# Patient Record
Sex: Female | Born: 1965
Health system: Southern US, Community
[De-identification: ages and names within clinical notes are randomized; demographics above are authoritative.]

## PROBLEM LIST (undated history)

## (undated) ENCOUNTER — Emergency Department (HOSPITAL_COMMUNITY): Payer: No Typology Code available for payment source | Source: Home / Self Care

## (undated) DIAGNOSIS — K4030 Unilateral inguinal hernia, with obstruction, without gangrene, not specified as recurrent: Secondary | ICD-10-CM

## (undated) DIAGNOSIS — F419 Anxiety disorder, unspecified: Secondary | ICD-10-CM

## (undated) DIAGNOSIS — T7840XA Allergy, unspecified, initial encounter: Secondary | ICD-10-CM

## (undated) DIAGNOSIS — I10 Essential (primary) hypertension: Secondary | ICD-10-CM

## (undated) DIAGNOSIS — F329 Major depressive disorder, single episode, unspecified: Secondary | ICD-10-CM

## (undated) DIAGNOSIS — G709 Myoneural disorder, unspecified: Secondary | ICD-10-CM

## (undated) DIAGNOSIS — F32A Depression, unspecified: Secondary | ICD-10-CM

## (undated) DIAGNOSIS — F909 Attention-deficit hyperactivity disorder, unspecified type: Secondary | ICD-10-CM

## (undated) HISTORY — DX: Anxiety disorder, unspecified: F41.9

## (undated) HISTORY — DX: Unilateral inguinal hernia, with obstruction, without gangrene, not specified as recurrent: K40.30

## (undated) HISTORY — DX: Essential (primary) hypertension: I10

## (undated) HISTORY — PX: FRACTURE SURGERY: SHX138

## (undated) HISTORY — PX: HERNIA REPAIR: SHX51

## (undated) HISTORY — DX: Myoneural disorder, unspecified: G70.9

## (undated) HISTORY — PX: TONSILLECTOMY: SUR1361

## (undated) HISTORY — DX: Allergy, unspecified, initial encounter: T78.40XA

---

## 2009-11-29 ENCOUNTER — Emergency Department (HOSPITAL_COMMUNITY): Admission: EM | Admit: 2009-11-29 | Discharge: 2009-11-29 | Payer: Self-pay | Admitting: Emergency Medicine

## 2010-07-21 ENCOUNTER — Encounter: Payer: Self-pay | Admitting: Chiropractic Medicine

## 2010-09-16 LAB — COMPREHENSIVE METABOLIC PANEL
AST: 114 U/L — ABNORMAL HIGH (ref 0–37)
Albumin: 4.4 g/dL (ref 3.5–5.2)
Alkaline Phosphatase: 69 U/L (ref 39–117)
CO2: 26 mEq/L (ref 19–32)
Calcium: 8.7 mg/dL (ref 8.4–10.5)
Chloride: 104 mEq/L (ref 96–112)
Creatinine, Ser: 0.63 mg/dL (ref 0.4–1.2)
GFR calc non Af Amer: >60 mL/min (ref >60)
Potassium: 3.3 mEq/L — ABNORMAL LOW (ref 3.5–5.1)
Total Protein: 7.2 g/dL (ref 6.0–8.3)

## 2010-09-16 LAB — CBC
HCT: 40.2 % (ref 36.0–46.0)
MCV: 100 fL (ref 78.0–100.0)
Platelets: 189 10*3/uL (ref 150–400)
RBC: 4.02 MIL/uL (ref 3.87–5.11)
RDW: 12.8 % (ref 11.5–15.5)
WBC: 4.1 10*3/uL (ref 4.0–10.5)

## 2010-09-16 LAB — URINALYSIS, ROUTINE W REFLEX MICROSCOPIC
Leukocytes, UA: NEGATIVE
Protein, ur: NEGATIVE mg/dL
Specific Gravity, Urine: 1.012 (ref 1.005–1.030)
Urobilinogen, UA: 0.2 mg/dL (ref 0.0–1.0)

## 2010-09-16 LAB — APTT: aPTT: 31 seconds (ref 24–37)

## 2010-09-16 LAB — DIFFERENTIAL
Eosinophils Absolute: 0.1 10*3/uL (ref 0.0–0.7)
Eosinophils Relative: 2 % (ref 0–5)
Lymphs Abs: 1.9 10*3/uL (ref 0.7–4.0)
Monocytes Relative: 8 % (ref 3–12)
Neutro Abs: 1.7 10*3/uL (ref 1.7–7.7)

## 2011-10-16 ENCOUNTER — Encounter (HOSPITAL_COMMUNITY): Payer: Self-pay | Admitting: Pharmacy Technician

## 2011-10-17 ENCOUNTER — Encounter (HOSPITAL_COMMUNITY): Payer: Self-pay

## 2011-10-17 ENCOUNTER — Ambulatory Visit
Admission: RE | Admit: 2011-10-17 | Discharge: 2011-10-17 | Disposition: A | Discharge status: Home / Self Care | Payer: No Typology Code available for payment source | Source: Ambulatory Visit | Attending: Orthopedic Surgery | Admitting: Orthopedic Surgery

## 2011-10-17 ENCOUNTER — Other Ambulatory Visit: Payer: Self-pay | Admitting: Orthopedic Surgery

## 2011-10-17 ENCOUNTER — Encounter (HOSPITAL_COMMUNITY)
Admission: RE | Admit: 2011-10-17 | Discharge: 2011-10-17 | Disposition: A | Discharge status: Home / Self Care | Payer: Self-pay | Source: Ambulatory Visit | Attending: Orthopedic Surgery | Admitting: Orthopedic Surgery

## 2011-10-17 DIAGNOSIS — S5290XA Unspecified fracture of unspecified forearm, initial encounter for closed fracture: Secondary | ICD-10-CM

## 2011-10-17 HISTORY — DX: Attention-deficit hyperactivity disorder, unspecified type: F90.9

## 2011-10-17 HISTORY — DX: Major depressive disorder, single episode, unspecified: F32.9

## 2011-10-17 HISTORY — DX: Depression, unspecified: F32.A

## 2011-10-17 LAB — CBC
HCT: 40 % (ref 36.0–46.0)
MCV: 94.3 fL (ref 78.0–100.0)
Platelets: 264 10*3/uL (ref 150–400)
RDW: 12.1 % (ref 11.5–15.5)
WBC: 5.1 10*3/uL (ref 4.0–10.5)

## 2011-10-17 LAB — HCG, SERUM, QUALITATIVE: Preg, Serum: NEGATIVE

## 2011-10-17 NOTE — Progress Notes (Signed)
Called Dr. Glenna Durand office, spoke with Eunice Blase, requested orders for preadmission, surgery date 10/18/11

## 2011-10-17 NOTE — H&P (Signed)
Rebekah Campbell is an 46 y.o. female.   Chief Complaint: Fall on outstretched right wrist HPI: Pt fell within previous week and sustained closed injury to right distal radius Pt seen and examined in office Pt here for surgery today to restore congruity to her wrist joint. Pt had preoperative CT scan done at [facility] imaging  Past Medical History  Diagnosis Date  . Depression     Hx ; had subsided.  . ADHD (attention deficit hyperactivity disorder) Dx'd 2008    Past Surgical History  Procedure Date  . No past surgeries   . Tonsillectomy     Family History  Problem Relation Age of Onset  . Anesthesia problems Neg Hx    Social History:  reports that she has never smoked. She does not have any smokeless tobacco history on file. She reports that she drinks alcohol. She reports that she does not use illicit drugs.  Allergies:  Allergies  Allergen Reactions  . Codeine Nausea And Vomiting    No current facility-administered medications on file as of .   No current outpatient prescriptions on file as of .    Results for orders placed during the hospital encounter of 10/17/11 (from the past 48 hour(s))  SURGICAL PCR SCREEN     Status: Abnormal   Collection Time   10/17/11 10:28 AM      Component Value Range Comment   MRSA, PCR POSITIVE (*) NEGATIVE     Staphylococcus aureus POSITIVE (*) NEGATIVE    CBC     Status: Normal   Collection Time   10/17/11 10:38 AM      Component Value Range Comment   WBC 5.1  4.0 - 10.5 (K/uL)    RBC 4.24  3.87 - 5.11 (MIL/uL)    Hemoglobin 13.7  12.0 - 15.0 (g/dL)    HCT 11.9  14.7 - 82.9 (%)    MCV 94.3  78.0 - 100.0 (fL)    MCH 32.3  26.0 - 34.0 (pg)    MCHC 34.3  30.0 - 36.0 (g/dL)    RDW 56.2  13.0 - 86.5 (%)    Platelets 264  150 - 400 (K/uL)   HCG, SERUM, QUALITATIVE     Status: Normal   Collection Time   10/17/11 10:38 AM      Component Value Range Comment   Preg, Serum NEGATIVE  NEGATIVE     Ct Wrist Right Wo Contrast  10/17/2011   *RADIOLOGY REPORT*  Clinical Data: Radial fracture status post fall 10/14/2011. Preoperative evaluation.  CT OF THE RIGHT WRIST WITHOUT CONTRAST  Technique:  Multidetector CT imaging was performed according to the standard protocol. Multiplanar CT image reconstructions were also generated.  Comparison: None.  Findings: There is a comminuted intra-articular fracture of the distal radius with associated apex volar angulation.  The posterior cortex demonstrates up to 4 mm of displacement.  There is intra- articular extension dorsally with up to 3 mm of offset of the articular surface.  Fracture extends into the distal radioulnar joint.  The radial styloid is not significantly displaced.  There is no significant widening of the distal radioulnar joint.  The distal ulna is intact.  No acute carpal bone fractures are identified.  There is a well corticated ossicle volar to the hamate consistent with an os hamuli or remote hook of hamate fracture.  Soft tissue windows demonstrate a radiocarpal joint effusion with soft tissue swelling surrounding the wrist.  Note tendon entrapment is identified.  There is  an apparent tiny superficial lipoma superficial to the extensor carpi radialis tendons at the level of the trapezium.  IMPRESSION:  1.  Comminuted intra-articular fracture of the distal radius with articular surface irregularity, displacement and mild angulation as described. 2.  Intact distal ulna and carpal bones. 3.  Os hamuli versus remote hook of hamate fracture.  Original Report Authenticated By: Gerrianne Scale, M.D.    No recent illnesses or hospitalizations  There were no vitals taken for this visit. General Appearance:  Alert, cooperative, no distress, appears stated age  Head:  Normocephalic, without obvious abnormality, atraumatic  Eyes:  Pupils equal, conjunctiva/corneas clear,         Throat: Lips, mucosa, and tongue normal; teeth and gums normal  Neck: No visible masses     Lungs:    respirations unlabored  Chest Wall:  No tenderness or deformity  Heart:  Regular rate and rhythm,  Abdomen:   Soft, non-tender,         Extremities: Right wrist: in sugartong splint. Fingers warm well perfused Wiggles digits. Splint in place Able to extend thumb  Pulses: 2+ and symmetric  Skin: Skin color, texture, turgor normal, no rashes or lesions     Neurologic: Normal    Assessment/Plan Right comminuted distal radius fracture, intraarticular displaced  Right wrist distal radius open reduction and internal fixation  R/B/A DISCUSSED WITH PT IN OFFICE.  PT VOICED UNDERSTANDING OF PLAN CONSENT SIGNED DAY OF SURGERY PT SEEN AND EXAMINED PRIOR TO OPERATIVE PROCEDURE/DAY OF SURGERY SITE MARKED. QUESTIONS ANSWERED WILL REMAIN AN INPATIENT FOLLOWING SURGERY-23 HOUR OBSERVATION  Sharma Covert 10/17/2011, 6:33 PM

## 2011-10-17 NOTE — Pre-Procedure Instructions (Signed)
19 Rebekah Campbell  10/17/2011   Your procedure is scheduled on:  Saturday, 10/18/2011 @7 :25AM.  Report to [facility] at 7:25 AM.  Call this number if you have problems the morning of surgery: [phone]   Remember:   Do not eat food:After Midnight.  May have clear liquids: up to 4 Hours before arrival( nothing after 3:25 AM).  Clear liquids include soda, tea, black coffee, apple or grape juice, broth.  Take these medicines the morning of surgery with A SIP OF WATER: Adderall   Do not wear jewelry, make-up or nail polish.  Do not wear lotions, powders, or perfumes. You may wear deodorant.  Do not shave 48 hours prior to surgery.  Do not bring valuables to the hospital.  Contacts, dentures or bridgework may not be worn into surgery.  Leave suitcase in the car. After surgery it may be brought to your room.  For patients admitted to the hospital, checkout time is 11:00 AM the day of discharge.   Patients discharged the day of surgery will not be allowed to drive home.  Name and phone number of your driver.  Special Instructions: CHG Shower Use Special Wash: 1/2 bottle night before surgery and 1/2 bottle morning of surgery.   Please read over the following fact sheets that you were given: Pain Booklet, Coughing and Deep Breathing and Surgical Site Infection Prevention

## 2011-10-17 NOTE — Pre-Procedure Instructions (Signed)
20 Rebekah Campbell  10/17/2011   Your procedure is scheduled on:  10/18/2011  Report to [facility] at 0725 AM.  Call this number if you have problems the morning of surgery: [phone]   Remember:   Do not eat food:After Midnight.  May have clear liquids: up to 4 Hours before arrival.0325 am ... Do not drink any liquids after 0325 am the day of surgery,.   Clear liquids include soda, tea, black coffee, apple or grape juice, broth.  Take these medicines the morning of surgery with A SIP OF WATER: adderall   Do not wear jewelry, make-up or nail polish.  Do not wear lotions, powders, or perfumes. You may wear deodorant.  Do not shave 48 hours prior to surgery.  Do not bring valuables to the hospital.  Contacts, dentures or bridgework may not be worn into surgery.  Leave suitcase in the car. After surgery it may be brought to your room.  For patients admitted to the hospital, checkout time is 11:00 AM the day of discharge.   Patients discharged the day of surgery will not be allowed to drive home.  Name and phone number of your driver: chet linker- friend  [phone]  Special Instructions: CHG Shower Use Special Wash: 1/2 bottle night before surgery and 1/2 bottle morning of surgery.   Please read over the following fact sheets that you were given: Pain Booklet, Coughing and Deep Breathing, MRSA Information and Surgical Site Infection Prevention

## 2011-10-18 ENCOUNTER — Encounter (HOSPITAL_COMMUNITY): Payer: Self-pay | Admitting: Anesthesiology

## 2011-10-18 ENCOUNTER — Encounter (HOSPITAL_COMMUNITY): Payer: Self-pay | Admitting: *Deleted

## 2011-10-18 ENCOUNTER — Encounter (HOSPITAL_COMMUNITY)
Admission: RE | Disposition: A | Discharge status: Home / Self Care | Payer: Self-pay | Source: Ambulatory Visit | Attending: Orthopedic Surgery

## 2011-10-18 ENCOUNTER — Ambulatory Visit (HOSPITAL_COMMUNITY)
Admission: RE | Admit: 2011-10-18 | Discharge: 2011-10-19 | Disposition: A | Discharge status: Home / Self Care | Payer: Self-pay | Source: Ambulatory Visit | Attending: Orthopedic Surgery | Admitting: Orthopedic Surgery

## 2011-10-18 ENCOUNTER — Ambulatory Visit (HOSPITAL_COMMUNITY): Payer: Self-pay | Admitting: Anesthesiology

## 2011-10-18 DIAGNOSIS — Y929 Unspecified place or not applicable: Secondary | ICD-10-CM | POA: Insufficient documentation

## 2011-10-18 DIAGNOSIS — Z01812 Encounter for preprocedural laboratory examination: Secondary | ICD-10-CM | POA: Insufficient documentation

## 2011-10-18 DIAGNOSIS — S52599A Other fractures of lower end of unspecified radius, initial encounter for closed fracture: Secondary | ICD-10-CM | POA: Insufficient documentation

## 2011-10-18 DIAGNOSIS — W19XXXA Unspecified fall, initial encounter: Secondary | ICD-10-CM | POA: Insufficient documentation

## 2011-10-18 SURGERY — OPEN REDUCTION INTERNAL FIXATION (ORIF) DISTAL RADIUS FRACTURE
Anesthesia: General | Site: Wrist | Laterality: Right | Wound class: Clean

## 2011-10-18 MED ORDER — CEFAZOLIN SODIUM 1-5 GM-% IV SOLN
1.0000 g | INTRAVENOUS | Status: AC
  Administered 2011-10-18: 1 g via INTRAVENOUS

## 2011-10-18 MED ORDER — OXYCODONE-ACETAMINOPHEN 5-325 MG PO TABS: 1.0000 | ORAL_TABLET | ORAL | Status: AC | PRN

## 2011-10-18 MED ORDER — PROPOFOL 10 MG/ML IV EMUL
INTRAVENOUS | Status: DC | PRN
  Administered 2011-10-18: 320 mg via INTRAVENOUS

## 2011-10-18 MED ORDER — OXYCODONE HCL 5 MG PO TABS
5.0000 mg | ORAL_TABLET | ORAL | Status: DC | PRN
  Administered 2011-10-18 – 2011-10-19 (×2): 10 mg via ORAL
  Filled 2011-10-18 (×2): qty 2

## 2011-10-18 MED ORDER — LACTATED RINGERS IV SOLN
INTRAVENOUS | Status: DC | PRN
  Administered 2011-10-18 (×2): via INTRAVENOUS

## 2011-10-18 MED ORDER — DOCUSATE SODIUM 100 MG PO CAPS
100.0000 mg | ORAL_CAPSULE | Freq: Two times a day (BID) | ORAL | Status: DC
  Administered 2011-10-18 – 2011-10-19 (×2): 100 mg via ORAL
  Filled 2011-10-18 (×3): qty 1

## 2011-10-18 MED ORDER — LIDOCAINE HCL (CARDIAC) 20 MG/ML IV SOLN
INTRAVENOUS | Status: DC | PRN
  Administered 2011-10-18: 80 mg via INTRAVENOUS

## 2011-10-18 MED ORDER — ONDANSETRON HCL 4 MG/2ML IJ SOLN
4.0000 mg | Freq: Four times a day (QID) | INTRAMUSCULAR | Status: DC | PRN
  Administered 2011-10-18 – 2011-10-19 (×2): 4 mg via INTRAVENOUS
  Filled 2011-10-18 (×2): qty 2

## 2011-10-18 MED ORDER — ZOLPIDEM TARTRATE 5 MG PO TABS
5.0000 mg | ORAL_TABLET | Freq: Every evening | ORAL | Status: DC | PRN
  Administered 2011-10-18: 5 mg via ORAL
  Filled 2011-10-18: qty 1

## 2011-10-18 MED ORDER — ONDANSETRON HCL 4 MG/2ML IJ SOLN
INTRAMUSCULAR | Status: DC | PRN
  Administered 2011-10-18: 4 mg via INTRAVENOUS
  Administered 2011-10-18: 4 mg

## 2011-10-18 MED ORDER — FENTANYL CITRATE 0.05 MG/ML IJ SOLN
INTRAMUSCULAR | Status: DC | PRN
  Administered 2011-10-18: 100 ug via INTRAVENOUS
  Administered 2011-10-18: 50 ug via INTRAVENOUS

## 2011-10-18 MED ORDER — DIPHENHYDRAMINE HCL 25 MG PO CAPS: 25.0000 mg | ORAL_CAPSULE | Freq: Four times a day (QID) | ORAL | Status: DC | PRN

## 2011-10-18 MED ORDER — MIDAZOLAM HCL 5 MG/5ML IJ SOLN
INTRAMUSCULAR | Status: DC | PRN
  Administered 2011-10-18: 2 mg via INTRAVENOUS

## 2011-10-18 MED ORDER — VITAMIN C 500 MG PO TABS
1000.0000 mg | ORAL_TABLET | Freq: Every day | ORAL | Status: DC
  Administered 2011-10-18 – 2011-10-19 (×2): 1000 mg via ORAL
  Filled 2011-10-18 (×2): qty 2

## 2011-10-18 MED ORDER — ONDANSETRON HCL 4 MG PO TABS
4.0000 mg | ORAL_TABLET | Freq: Four times a day (QID) | ORAL | Status: DC | PRN
  Administered 2011-10-18: 4 mg via ORAL
  Filled 2011-10-18: qty 1

## 2011-10-18 MED ORDER — HYDROCHLOROTHIAZIDE 25 MG PO TABS
25.0000 mg | ORAL_TABLET | Freq: Every day | ORAL | Status: DC | PRN
  Filled 2011-10-18: qty 1

## 2011-10-18 MED ORDER — AMPHETAMINE-DEXTROAMPHETAMINE 10 MG PO TABS
15.0000 mg | ORAL_TABLET | Freq: Two times a day (BID) | ORAL | Status: DC
  Administered 2011-10-18: 15 mg via ORAL
  Administered 2011-10-19: 5 mg via ORAL
  Administered 2011-10-19: 15 mg via ORAL
  Filled 2011-10-18 (×3): qty 2

## 2011-10-18 MED ORDER — FENTANYL CITRATE 0.05 MG/ML IJ SOLN
INTRAMUSCULAR | Status: DC | PRN
  Administered 2011-10-18: 50 ug via INTRAVENOUS

## 2011-10-18 MED ORDER — HYDROMORPHONE HCL PF 1 MG/ML IJ SOLN
0.5000 mg | INTRAMUSCULAR | Status: DC | PRN
  Administered 2011-10-18 – 2011-10-19 (×4): 1 mg via INTRAVENOUS
  Filled 2011-10-18 (×4): qty 1

## 2011-10-18 MED ORDER — ALPRAZOLAM 0.5 MG PO TABS
0.5000 mg | ORAL_TABLET | Freq: Four times a day (QID) | ORAL | Status: DC | PRN
  Administered 2011-10-18 – 2011-10-19 (×3): 0.5 mg via ORAL
  Filled 2011-10-18 (×3): qty 1

## 2011-10-18 MED ORDER — BUPIVACAINE-EPINEPHRINE PF 0.5-1:200000 % IJ SOLN
INTRAMUSCULAR | Status: DC | PRN
  Administered 2011-10-18: 30 mL

## 2011-10-18 MED ORDER — PROPOFOL 10 MG/ML IV BOLUS
INTRAVENOUS | Status: DC | PRN
  Administered 2011-10-18: 200 mg via INTRAVENOUS

## 2011-10-18 MED ORDER — VITAMIN C 500 MG PO TABS: 500.0000 mg | ORAL_TABLET | Freq: Every day | ORAL | Status: AC

## 2011-10-18 MED ORDER — AMPHETAMINE-DEXTROAMPHETAMINE 30 MG PO TABS: 15.0000 mg | ORAL_TABLET | Freq: Two times a day (BID) | ORAL | Status: DC

## 2011-10-18 MED ORDER — METHOCARBAMOL 500 MG PO TABS
500.0000 mg | ORAL_TABLET | Freq: Four times a day (QID) | ORAL | Status: DC | PRN
  Administered 2011-10-18 – 2011-10-19 (×2): 500 mg via ORAL
  Filled 2011-10-18 (×2): qty 1

## 2011-10-18 MED ORDER — HYDROMORPHONE HCL PF 1 MG/ML IJ SOLN: 0.2500 mg | INTRAMUSCULAR | Status: DC | PRN

## 2011-10-18 MED ORDER — ADULT MULTIVITAMIN W/MINERALS CH
1.0000 | ORAL_TABLET | Freq: Every day | ORAL | Status: DC
  Administered 2011-10-18 – 2011-10-19 (×2): 1 via ORAL
  Filled 2011-10-18 (×2): qty 1

## 2011-10-18 MED ORDER — DOCUSATE SODIUM 100 MG PO CAPS: 100.0000 mg | ORAL_CAPSULE | Freq: Two times a day (BID) | ORAL | Status: AC

## 2011-10-18 MED ORDER — CHLORHEXIDINE GLUCONATE 4 % EX LIQD: 60.0000 mL | Freq: Once | CUTANEOUS | Status: DC

## 2011-10-18 MED ORDER — DEXTROSE 5 % IV SOLN
500.0000 mg | Freq: Four times a day (QID) | INTRAVENOUS | Status: DC | PRN
  Filled 2011-10-18: qty 5

## 2011-10-18 MED ORDER — ONDANSETRON HCL 4 MG PO TABS: 4.0000 mg | ORAL_TABLET | Freq: Three times a day (TID) | ORAL | Status: AC | PRN

## 2011-10-18 MED ORDER — 0.9 % SODIUM CHLORIDE (POUR BTL) OPTIME
TOPICAL | Status: DC | PRN
  Administered 2011-10-18: 1000 mL

## 2011-10-18 MED ORDER — ONDANSETRON HCL 4 MG/2ML IJ SOLN
INTRAMUSCULAR | Status: DC | PRN
  Administered 2011-10-18: 4 mg via INTRAVENOUS

## 2011-10-18 MED ORDER — CEFAZOLIN SODIUM 1-5 GM-% IV SOLN
INTRAVENOUS | Status: AC
  Administered 2011-10-18: 1 g via INTRAVENOUS
  Filled 2011-10-18: qty 50

## 2011-10-18 MED ORDER — ONDANSETRON HCL 4 MG/2ML IJ SOLN: 4.0000 mg | Freq: Four times a day (QID) | INTRAMUSCULAR | Status: DC | PRN

## 2011-10-18 MED ORDER — ONDANSETRON HCL 4 MG/2ML IJ SOLN
INTRAMUSCULAR | Status: AC
  Administered 2011-10-18: 4 mg via INTRAVENOUS
  Filled 2011-10-18: qty 2

## 2011-10-18 MED ORDER — CEFAZOLIN SODIUM 1-5 GM-% IV SOLN
1.0000 g | Freq: Three times a day (TID) | INTRAVENOUS | Status: DC
  Administered 2011-10-18 – 2011-10-19 (×3): 1 g via INTRAVENOUS
  Filled 2011-10-18 (×5): qty 50

## 2011-10-18 MED ORDER — KCL IN DEXTROSE-NACL 20-5-0.45 MEQ/L-%-% IV SOLN
INTRAVENOUS | Status: DC
  Administered 2011-10-18: 75 mL/h via INTRAVENOUS
  Administered 2011-10-19: 06:00:00 via INTRAVENOUS
  Filled 2011-10-18 (×3): qty 1000

## 2011-10-18 MED ORDER — METHOCARBAMOL 500 MG PO TABS: 500.0000 mg | ORAL_TABLET | Freq: Four times a day (QID) | ORAL | Status: AC

## 2011-10-18 MED ORDER — HYDROCODONE-ACETAMINOPHEN 7.5-325 MG PO TABS
1.0000 | ORAL_TABLET | ORAL | Status: DC | PRN
  Administered 2011-10-18: 2 via ORAL
  Filled 2011-10-18: qty 2

## 2011-10-18 SURGICAL SUPPLY — 63 items
BANDAGE ELASTIC 3 VELCRO ST LF (GAUZE/BANDAGES/DRESSINGS) ×2 IMPLANT
BANDAGE ELASTIC 4 VELCRO ST LF (GAUZE/BANDAGES/DRESSINGS) ×2 IMPLANT
BANDAGE GAUZE ELAST BULKY 4 IN (GAUZE/BANDAGES/DRESSINGS) ×2 IMPLANT
BIT DRILL 2 FAST STEP (BIT) ×2 IMPLANT
BIT DRILL 2.5X4 QC (BIT) ×2 IMPLANT
BLADE SURG ROTATE 9660 (MISCELLANEOUS) IMPLANT
BNDG ESMARK 4X9 LF (GAUZE/BANDAGES/DRESSINGS) ×2 IMPLANT
CLOTH BEACON ORANGE TIMEOUT ST (SAFETY) ×2 IMPLANT
CORDS BIPOLAR (ELECTRODE) ×2 IMPLANT
COVER SURGICAL LIGHT HANDLE (MISCELLANEOUS) ×2 IMPLANT
CUFF TOURNIQUET SINGLE 18IN (TOURNIQUET CUFF) ×2 IMPLANT
CUFF TOURNIQUET SINGLE 24IN (TOURNIQUET CUFF) IMPLANT
DRAIN TLS ROUND 10FR (DRAIN) IMPLANT
DRAPE OEC MINIVIEW 54X84 (DRAPES) ×2 IMPLANT
DRAPE SURG 17X11 SM STRL (DRAPES) ×2 IMPLANT
DRSG ADAPTIC 3X8 NADH LF (GAUZE/BANDAGES/DRESSINGS) ×2 IMPLANT
ELECT REM PT RETURN 9FT ADLT (ELECTROSURGICAL)
ELECTRODE REM PT RTRN 9FT ADLT (ELECTROSURGICAL) IMPLANT
GAUZE SPONGE 4X4 16PLY XRAY LF (GAUZE/BANDAGES/DRESSINGS) ×2 IMPLANT
GLOVE BIOGEL PI IND STRL 8.5 (GLOVE) ×1 IMPLANT
GLOVE BIOGEL PI INDICATOR 8.5 (GLOVE) ×1
GLOVE SURG ORTHO 8.0 STRL STRW (GLOVE) ×2 IMPLANT
GOWN PREVENTION PLUS XLARGE (GOWN DISPOSABLE) ×2 IMPLANT
GOWN STRL NON-REIN LRG LVL3 (GOWN DISPOSABLE) ×2 IMPLANT
K-WIRE 1.6 (WIRE) ×1
K-WIRE FX5X1.6XNS BN SS (WIRE) ×1
KIT BASIN OR (CUSTOM PROCEDURE TRAY) ×2 IMPLANT
KIT ROOM TURNOVER OR (KITS) ×2 IMPLANT
KWIRE FX5X1.6XNS BN SS (WIRE) ×1 IMPLANT
MANIFOLD NEPTUNE II (INSTRUMENTS) ×2 IMPLANT
NEEDLE HYPO 25X1 1.5 SAFETY (NEEDLE) IMPLANT
NS IRRIG 1000ML POUR BTL (IV SOLUTION) ×2 IMPLANT
PACK ORTHO EXTREMITY (CUSTOM PROCEDURE TRAY) ×2 IMPLANT
PAD ARMBOARD 7.5X6 YLW CONV (MISCELLANEOUS) ×4 IMPLANT
PAD CAST 4YDX4 CTTN HI CHSV (CAST SUPPLIES) ×1 IMPLANT
PADDING CAST COTTON 4X4 STRL (CAST SUPPLIES) ×1
PEG SUBCHONDRAL SMOOTH 2.0X18 (Peg) ×2 IMPLANT
PEG SUBCHONDRAL SMOOTH 2.0X20 (Peg) ×2 IMPLANT
PEG SUBCHONDRAL SMOOTH 2.0X22 (Peg) ×2 IMPLANT
PEG THREADED 2.5MMX22MM LONG (Peg) ×6 IMPLANT
PEG THREADED 2.5MMX24MM LONG (Peg) ×2 IMPLANT
PLATE SHORT 24.4X51.3 RT (Plate) ×2 IMPLANT
SCREW BN 12X3.5XNS CORT TI (Screw) ×1 IMPLANT
SCREW CORT 3.5X12 (Screw) ×1 IMPLANT
SCREW CORT 3.5X14 LNG (Screw) ×4 IMPLANT
SOAP 2 % CHG 4 OZ (WOUND CARE) ×2 IMPLANT
SPLINT FIBERGLASS 3X35 (CAST SUPPLIES) ×2 IMPLANT
SPONGE GAUZE 4X4 12PLY (GAUZE/BANDAGES/DRESSINGS) ×2 IMPLANT
SPONGE LAP 4X18 X RAY DECT (DISPOSABLE) ×2 IMPLANT
STRIP CLOSURE SKIN 1/2X4 (GAUZE/BANDAGES/DRESSINGS) IMPLANT
SUCTION FRAZIER TIP 10 FR DISP (SUCTIONS) ×2 IMPLANT
SUT ETHILON 4 0 PS 2 18 (SUTURE) IMPLANT
SUT MNCRL AB 4-0 PS2 18 (SUTURE) ×2 IMPLANT
SUT PROLENE 4 0 PS 2 18 (SUTURE) ×4 IMPLANT
SUT VIC AB 2-0 FS1 27 (SUTURE) ×2 IMPLANT
SUT VICRYL 4-0 PS2 18IN ABS (SUTURE) ×2 IMPLANT
SYR CONTROL 10ML LL (SYRINGE) IMPLANT
SYSTEM CHEST DRAIN TLS 7FR (DRAIN) IMPLANT
TOWEL OR 17X24 6PK STRL BLUE (TOWEL DISPOSABLE) ×2 IMPLANT
TOWEL OR 17X26 10 PK STRL BLUE (TOWEL DISPOSABLE) ×2 IMPLANT
TUBE CONNECTING 12X1/4 (SUCTIONS) ×2 IMPLANT
WATER STERILE IRR 1000ML POUR (IV SOLUTION) ×2 IMPLANT
YANKAUER SUCT BULB TIP NO VENT (SUCTIONS) IMPLANT

## 2011-10-18 NOTE — Brief Op Note (Signed)
10/18/2011  1:06 PM  PATIENT:  Rebekah Campbell  46 y.o. female  PRE-OPERATIVE DIAGNOSIS:  RIGHT DISTAL RADIUS FRACTURE   POST-OPERATIVE DIAGNOSIS:  Right Distal Radius Fracture  PROCEDURE:  Procedure(s) (LRB): OPEN REDUCTION INTERNAL FIXATION (ORIF) DISTAL RADIAL FRACTURE (Right)  SURGEON:  Surgeon(s) and Role:    * Sharma Covert, MD - Primary  PHYSICIAN ASSISTANT:   ASSISTANTS: none   ANESTHESIA:   general  EBL:  Total I/O In: 1000 [I.V.:1000] Out: -   BLOOD ADMINISTERED:none  DRAINS: none   LOCAL MEDICATIONS USED:  NONE  SPECIMEN:  No Specimen  DISPOSITION OF SPECIMEN:  N/A  COUNTS:  NO yes  TOURNIQUET:   Total Tourniquet Time Documented: Upper Arm (Right) - 58 minutes  DICTATION: .Other Dictation: Dictation Number [id number]  PLAN OF CARE: Admit for overnight observation  PATIENT DISPOSITION:  PACU - hemodynamically stable.   Delay start of Pharmacological VTE agent (>24hrs) due to surgical blood loss or risk of bleeding: not applicable

## 2011-10-18 NOTE — Anesthesia Preprocedure Evaluation (Addendum)
Anesthesia Evaluation  Patient identified by MRN, date of birth, ID band Patient awake    Reviewed: Allergy & Precautions, H&P , NPO status , Patient's Chart, lab work & pertinent test results  Airway Mallampati: I  Neck ROM: full    Dental  (+) Teeth Intact and Dental Advidsory Given   Pulmonary  breath sounds clear to auscultation        Cardiovascular negative cardio ROS  Rhythm:regular     Neuro/Psych PSYCHIATRIC DISORDERS    GI/Hepatic negative GI ROS, Neg liver ROS,   Endo/Other  negative endocrine ROS  Renal/GU negative Renal ROS     Musculoskeletal negative musculoskeletal ROS (+)   Abdominal   Peds  Hematology negative hematology ROS (+)   Anesthesia Other Findings   Reproductive/Obstetrics negative OB ROS                          Anesthesia Physical Anesthesia Plan  ASA: I  Anesthesia Plan: General   Post-op Pain Management:    Induction: Intravenous  Airway Management Planned: LMA  Additional Equipment:   Intra-op Plan:   Post-operative Plan: Extubation in OR  Informed Consent:   Dental advisory given  Plan Discussed with: CRNA and Anesthesiologist  Anesthesia Plan Comments:         Anesthesia Quick Evaluation

## 2011-10-18 NOTE — Progress Notes (Signed)
Patient aware that mc  IS NOT RESPONSIBLE FOR BELONGINGS, SHE STATES WE CAN SEND TO OR WITH HER AND NOT LOCK UP IN SECURITY.

## 2011-10-18 NOTE — Transfer of Care (Signed)
Immediate Anesthesia Transfer of Care Note  Patient: Rebekah Campbell  Procedure(s) Performed: Procedure(s) (LRB): OPEN REDUCTION INTERNAL FIXATION (ORIF) DISTAL RADIAL FRACTURE (Right)  Patient Location: PACU  Anesthesia Type: General  Level of Consciousness: awake, alert , oriented and sedated  Airway & Oxygen Therapy: Patient Spontanous Breathing  Post-op Assessment: Report given to PACU RN and Patient moving all extremities  Post vital signs: Reviewed and stable  Complications: No apparent anesthesia complications

## 2011-10-18 NOTE — Preoperative (Deleted)
Beta Blockers   Reason not to administer Beta Blockers:Not Applicable 

## 2011-10-18 NOTE — Anesthesia Procedure Notes (Signed)
Anesthesia Regional Block:  Supraclavicular block  Pre-Anesthetic Checklist: ,, timeout performed, Correct Patient, Correct Site, Correct Laterality, Correct Procedure, Correct Position, site marked, Risks and benefits discussed,  Surgical consent,  Pre-op evaluation,  At surgeon's request and post-op pain management  Laterality: Right  Prep: chloraprep       Needles:  Injection technique: Single-shot  Needle Type: Echogenic Stimulator Needle     Needle Length: 5cm 5 cm Needle Gauge: 22 and 22 G    Additional Needles:  Procedures: ultrasound guided and nerve stimulator Supraclavicular block  Nerve Stimulator or Paresthesia:  Response: biceps flexion, 0.45 mA,   Additional Responses:   Narrative:  Start time: 10/18/2011 10:05 AM End time: 10/18/2011 10:30 AM Injection made incrementally with aspirations every 5 mL.  Performed by: Personally  Anesthesiologist: Dr Chaney Malling  Additional Notes: Functioning IV was confirmed and monitors were applied.  A 50mm 22ga Arrow echogenic stimulator needle was used. Sterile prep and drape,hand hygiene and sterile gloves were used.  Negative aspiration and negative test dose prior to incremental administration of local anesthetic. The patient tolerated the procedure well.  Ultrasound guidance: relevent anatomy identified, needle position confirmed, local anesthetic spread visualized around nerve(s), vascular puncture avoided.  Image printed for medical record.   Supraclavicular block

## 2011-10-18 NOTE — Op Note (Signed)
PREOPERATIVE DIAGNOSIS: Rightt wrist intra-articular distal radius  fracture, 3 or more fragments.   POSTOPERATIVE DIAGNOSIS: Rightt wrist intra-articular distal radius  fracture, 3 or more fragments.   ATTENDING PHYSICIAN: Sharma Covert IV, MD who scrubbed and present  entire procedure.   ASSISTANT SURGEON: None.   ANESTHESIA: Supraclavicular block performed by Dr. Pennie Banter and general  anesthesia.   SURGICAL IMPLANTS: [employer] DVR plate, standard with six distal locking pegs and 3 bicortical screws proximally  SURGICAL PROCEDURE:  1. Open treatment of rightt wrist intra-articular distal radius  fracture, 3 or more fragments.   2. Right wrist brachioradialis tenotomy and release.   3. Radiographs, stress radiographs, rightt wrist.   SURGICAL INDICATIONS: Rebekah Campbell  is a right-hand-dominant female who sustained an intra-articular distal radius fracture after a fall. The  patient was seen and evaluated in the office based on degree of  displacement and the  displacement, recommended that she undergo  the above procedure. Risks, benefits, and alternatives were discussed  in detail with the patient. Signed informed consent was obtained.  Risks include, but not limited to bleeding, infection, damage to nearby  nerves, arteries, or tendons, nonunion, malunion, hardware failure, loss  of motion of the elbow, wrist, and digits, and need for further surgical  intervention.   PROCEDURE: The patient was properly identified in the preop holding  area. A mark with a permanent marker was made on the left wrist to  indicate correct operative site. The patient tolerated the block  performed by Anesthesia. The patient was then brought back to the  operating room. The patient received preoperative antibiotics. General  anesthesia was induced. Right upper extremity was prepped and draped in  normal sterile fashion. Time-out was called. Correct site was  identified, and procedure  then begun. Attention was then turned to the  left wrist. The limb was then elevated using Esmarch exsanguination and  tourniquet insufflated. A longitudinal incision was made directly over  the FCR sheath. Dissection was then carried down through the skin and  subcutaneous tissue. The FCR sheath was then opened proximally and  distally. Careful dissection was Campbell going through the floor of the  FCR sheath where the FPL was identified. An L-shaped pronator quadratus  flap was then elevated. In order to aid in reduction tenotomy of the brachioradialis was completed with protection of the first dorsal compartment tendons.  The fracture site was then opened and the patient did have several fracture fragment extending into the joint greater than 3 part intra-articular fracture. Careful open reduction was then carried out. The appropriate size dvr plate was chosen.. The oblong screw hole was then drilled with a 2.5 mm drill bit, then 3.5 mm bicortical screw. Plate height was adjusted.   After position was then confirmed using mini C-arm, the distal row  fixation was then carried out with the beginning from an ulnar to radial  direction with the  locking pegs and partially threaded pegs. Attention was made to accurately reduce the volar and dorsal ulnar cortices.  The total of 6 locking pegs were then placed. Following this, attention was then turned proximally where 2 more nonlocking screws were then placed in the 2.5 mm drill bit, the 3.5 mm non-locking screws.  The wound was then thoroughly irrigated. Final stress radiography was then carried out.  Stress radiographs were then obtained under live fluoro showing no widening of the SL interval. I did not see any carpal dissociation with good fixation, without any evidence of  penetration in the articular margin with the locking pegs.   Postop, the pronator quadratus was then closed with 2-0 Vicryl.  Tourniquet was then deflated. Hemostasis was then  obtained. The  subcutaneous tissues closed with 4-0 Vicryl and skin closed with simple  prolene sutures. Adaptic dressing and sterile compressive bandage was  then applied. The patient was then placed in a well-padded sugartong splint. Extubated and taken to recovery room in good condition.    INTRAOPERATIVE RADIOGRAPHS:, 3 views of the wrist do show  the volar plate fixation in place. There is good position in both  planes.    POSTOPERATIVE PLAN: The patient will be admitted for IV antibiotics and  pain control; discharged in the morning. Seen back in the office for  approximately 10-14 days for wound check, suture removal, and then x-  rays, short-arm cast for total 4 weeks, and then begin a therapy regimen  around a 4-week mark. Radiographs at each visit.    Rebekah Done, MD

## 2011-10-18 NOTE — Discharge Instructions (Signed)
KEEP BANDAGE CLEAN AND DRY CALL OFFICE FOR F/U APPT [id number] in 12 days KEEP HAND ELEVATED ABOVE HEART OK TO APPLY ICE TO OPERATIVE AREA CONTACT OFFICE IF ANY WORSENING PAIN OR CONCERNS. Dr.ortmann cell [phone]

## 2011-10-18 NOTE — Anesthesia Postprocedure Evaluation (Signed)
Anesthesia Post Note  Patient: Rebekah Campbell  Procedure(s) Performed: Procedure(s) (LRB): OPEN REDUCTION INTERNAL FIXATION (ORIF) DISTAL RADIAL FRACTURE (Right)  Anesthesia type: General  Patient location: PACU  Post pain: Pain level controlled and Adequate analgesia  Post assessment: Post-op Vital signs reviewed, Patient's Cardiovascular Status Stable, Respiratory Function Stable, Patent Airway and Pain level controlled  Last Vitals:  Filed Vitals:   10/18/11 1320  BP: 129/86  Pulse: 80  Temp: 36.3 C  Resp: 12    Post vital signs: Reviewed and stable  Level of consciousness: awake, alert  and oriented  Complications: No apparent anesthesia complications

## 2011-10-18 NOTE — Progress Notes (Signed)
Orthopedic Tech Progress Note Patient Details:  Rebekah Campbell 01/03/1966 [id number]  Other Ortho Devices Ortho Device Location: kuzma sling Ortho Device Interventions: Application   Cammer, Rebekah Campbell 10/18/2011, 3:41 PM

## 2011-10-18 NOTE — Anesthesia Preprocedure Evaluation (Deleted)
Anesthesia Evaluation  Patient identified by MRN, date of birth, ID band Patient awake    Reviewed: Allergy & Precautions, H&P , NPO status , Patient's Chart, lab work & pertinent test results  Airway Mallampati: II  Neck ROM: full    Dental   Pulmonary          Cardiovascular     Neuro/Psych PSYCHIATRIC DISORDERS Depression ADHD   GI/Hepatic   Endo/Other    Renal/GU      Musculoskeletal   Abdominal   Peds  Hematology   Anesthesia Other Findings   Reproductive/Obstetrics                           Anesthesia Physical Anesthesia Plan  ASA: II  Anesthesia Plan: General and Regional   Post-op Pain Management: MAC Combined w/ Regional for Post-op pain   Induction: Intravenous  Airway Management Planned: LMA  Additional Equipment:   Intra-op Plan:   Post-operative Plan:   Informed Consent: I have reviewed the patients History and Physical, chart, labs and discussed the procedure including the risks, benefits and alternatives for the proposed anesthesia with the patient or authorized representative who has indicated his/her understanding and acceptance.     Plan Discussed with: CRNA and Surgeon  Anesthesia Plan Comments:         Anesthesia Quick Evaluation

## 2011-10-19 MED ORDER — CHLORHEXIDINE GLUCONATE CLOTH 2 % EX PADS: 6.0000 | MEDICATED_PAD | Freq: Every day | CUTANEOUS | Status: DC

## 2011-10-19 MED ORDER — MUPIROCIN 2 % EX OINT
1.0000 "application " | TOPICAL_OINTMENT | Freq: Two times a day (BID) | CUTANEOUS | Status: DC
  Administered 2011-10-19: 1 via NASAL
  Filled 2011-10-19: qty 22

## 2011-10-19 NOTE — Discharge Summary (Signed)
Physician Discharge Summary  Patient ID: Rebekah Campbell MRN: [id number] DOB/AGE: [birth year] 47 y.o.  Admit date: 10/18/2011 Discharge date: 10/19/2011  Admission Diagnoses: RIGHT DISTAL RADIUS FRACTURE  Past Medical History  Diagnosis Date  . Depression     Hx ; had subsided.  . ADHD (attention deficit hyperactivity disorder) Dx'd 2008    Discharge Diagnoses:  Right distal radius fracture  Surgeries: Procedure(s): OPEN REDUCTION INTERNAL FIXATION (ORIF) DISTAL RADIAL FRACTURE on 10/18/2011    Consultants:  none  Discharged Condition: Improved  Hospital Course: [place] KEILYN HAGGARD is an 46 y.o. female who was admitted 10/18/2011 with a chief complaint of No chief complaint on file. , and found to have a diagnosis of RIGHT DISTAL RADIUS FRACTURE .  They were brought to the operating room on 10/18/2011 and underwent Procedure(s): OPEN REDUCTION INTERNAL FIXATION (ORIF) DISTAL RADIAL FRACTURE.    They were given perioperative antibiotics: Anti-infectives     Start     Dose/Rate Route Frequency Ordered Stop   10/19/11 0000   ceFAZolin (ANCEF) IVPB 1 g/50 mL premix        1 g 100 mL/hr over 30 Minutes Intravenous Every 8 hours 10/18/11 1619     10/18/11 1530   ceFAZolin (ANCEF) IVPB 1 g/50 mL premix        1 g 100 mL/hr over 30 Minutes Intravenous NOW 10/18/11 1517 10/18/11 1553   10/18/11 0853   ceFAZolin (ANCEF) IVPB 1 g/50 mL premix        1 g 100 mL/hr over 30 Minutes Intravenous 60 min pre-op 10/18/11 0853 10/18/11 1132        .  They were given sequential compression devices, early ambulation, and early ambulation for DVT prophylaxis.  Recent vital signs: Patient Vitals for the past 24 hrs:  BP Temp Pulse Resp SpO2  10/19/11 0622 127/79 mmHg 97.9 F (36.6 C) 80  18  97 %  10/18/11 2214 154/90 mmHg 98.2 F (36.8 C) 89  16  98 %  10/18/11 1615 139/82 mmHg 98.6 F (37 C) 63  14  100 %  10/18/11 1445 - 98.5 F (36.9 C) - - -  10/18/11 1431 - - 71  13  93 %  10/18/11  1430 - - 71  14  93 %  10/18/11 1429 - - 74  14  93 %  10/18/11 1428 - - 72  12  93 %  10/18/11 1427 - - 72  13  93 %  10/18/11 1426 - - 72  13  93 %  10/18/11 1425 - - 72  12  93 %  10/18/11 1424 - - 74  12  93 %  10/18/11 1423 120/79 mmHg - 73  12  93 %  10/18/11 1422 - - 73  13  93 %  10/18/11 1421 - - 76  12  93 %  10/18/11 1420 - - 79  12  94 %  10/18/11 1419 - - 74  13  92 %  10/18/11 1418 - - 77  11  95 %  10/18/11 1417 - - 80  12  93 %  10/18/11 1416 - - 86  15  95 %  10/18/11 1415 - - 90  17  95 %  10/18/11 1414 - - 84  16  94 %  10/18/11 1413 - - 84  14  94 %  10/18/11 1412 - - 83  16  95 %  10/18/11 1411 - - 86  15  94 %  10/18/11 1410 - - 86  16  95 %  10/18/11 1409 - - 84  14  95 %  10/18/11 1408 122/87 mmHg - 89  16  95 %  10/18/11 1407 - - 91  17  93 %  10/18/11 1406 - - 91  22  92 %  10/18/11 1405 - - 91  13  94 %  10/18/11 1404 - - 93  25  94 %  10/18/11 1403 - - 93  16  94 %  10/18/11 1402 - - 93  16  94 %  10/18/11 1401 - - 92  16  94 %  10/18/11 1400 - - 89  13  94 %  10/18/11 1359 - - 92  12  95 %  10/18/11 1358 - - 95  10  95 %  10/18/11 1357 - - 95  14  94 %  10/18/11 1356 - - 90  15  94 %  10/18/11 1355 - - 90  16  95 %  10/18/11 1354 - - 90  16  94 %  10/18/11 1353 103/70 mmHg - 94  16  95 %  10/18/11 1352 - - 98  14  95 %  10/18/11 1351 - - 100  14  95 %  10/18/11 1350 - - 104  16  95 %  10/18/11 1349 - - 98  19  95 %  10/18/11 1348 - - 98  16  96 %  10/18/11 1347 - - 101  18  96 %  10/18/11 1346 - - 99  15  96 %  10/18/11 1345 - - 99  19  96 %  10/18/11 1344 - - 97  19  95 %  10/18/11 1343 - - 95  15  95 %  10/18/11 1342 - - 95  19  95 %  10/18/11 1341 - - 95  15  95 %  10/18/11 1340 - - 88  15  93 %  10/18/11 1339 - - 83  15  93 %  10/18/11 1338 111/61 mmHg - 89  14  94 %  10/18/11 1337 - - 95  19  93 %  10/18/11 1336 - - 78  16  92 %  10/18/11 1335 - - 73  11  93 %  10/18/11 1334 - - 81  17  93 %  10/18/11 1333 - - 80  18  93 %    10/18/11 1332 - - 85  14  94 %  10/18/11 1331 - - 91  13  94 %  10/18/11 1330 - 98.5 F (36.9 C) 87  12  92 %  10/18/11 1329 - - 80  13  92 %  10/18/11 1328 - - 80  13  92 %  10/18/11 1327 - - 84  13  93 %  10/18/11 1326 - - 89  15  93 %  10/18/11 1320 129/86 mmHg 97.4 F (36.3 C) 80  12  94 %  .  Recent laboratory studies: Ct Wrist Right Wo Contrast  10/17/2011  *RADIOLOGY REPORT*  Clinical Data: Radial fracture status post fall 10/14/2011. Preoperative evaluation.  CT OF THE RIGHT WRIST WITHOUT CONTRAST  Technique:  Multidetector CT imaging was performed according to the standard protocol. Multiplanar CT image reconstructions were also generated.  Comparison: None.  Findings: There is a comminuted intra-articular fracture of the distal radius with associated apex volar angulation.  The posterior cortex demonstrates  up to 4 mm of displacement.  There is intra- articular extension dorsally with up to 3 mm of offset of the articular surface.  Fracture extends into the distal radioulnar joint.  The radial styloid is not significantly displaced.  There is no significant widening of the distal radioulnar joint.  The distal ulna is intact.  No acute carpal bone fractures are identified.  There is a well corticated ossicle volar to the hamate consistent with an os hamuli or remote hook of hamate fracture.  Soft tissue windows demonstrate a radiocarpal joint effusion with soft tissue swelling surrounding the wrist.  Note tendon entrapment is identified.  There is an apparent tiny superficial lipoma superficial to the extensor carpi radialis tendons at the level of the trapezium.  IMPRESSION:  1.  Comminuted intra-articular fracture of the distal radius with articular surface irregularity, displacement and mild angulation as described. 2.  Intact distal ulna and carpal bones. 3.  Os hamuli versus remote hook of hamate fracture.  Original Report Authenticated By: Gerrianne Scale, M.D.    Discharge  Medications:   Medication List  As of 10/19/2011 10:31 AM   TAKE these medications         amphetamine-dextroamphetamine 30 MG tablet   Commonly known as: ADDERALL   Take 15 mg by mouth 2 (two) times daily.      docusate sodium 100 MG capsule   Commonly known as: COLACE   Take 1 capsule (100 mg total) by mouth 2 (two) times daily.      hydrochlorothiazide 25 MG tablet   Commonly known as: HYDRODIURIL   Take 25 mg by mouth daily as needed. When swelling noticed      methocarbamol 500 MG tablet   Commonly known as: ROBAXIN   Take 1 tablet (500 mg total) by mouth 4 (four) times daily.      ondansetron 4 MG tablet   Commonly known as: ZOFRAN   Take 1 tablet (4 mg total) by mouth every 8 (eight) hours as needed for nausea.      oxyCODONE-acetaminophen 5-325 MG per tablet   Commonly known as: PERCOCET   Take 1 tablet by mouth every 4 (four) hours as needed for pain.      vitamin C 500 MG tablet   Commonly known as: ASCORBIC ACID   Take 1 tablet (500 mg total) by mouth daily.            Diagnostic Studies: Ct Wrist Right Wo Contrast  10/17/2011  *RADIOLOGY REPORT*  Clinical Data: Radial fracture status post fall 10/14/2011. Preoperative evaluation.  CT OF THE RIGHT WRIST WITHOUT CONTRAST  Technique:  Multidetector CT imaging was performed according to the standard protocol. Multiplanar CT image reconstructions were also generated.  Comparison: None.  Findings: There is a comminuted intra-articular fracture of the distal radius with associated apex volar angulation.  The posterior cortex demonstrates up to 4 mm of displacement.  There is intra- articular extension dorsally with up to 3 mm of offset of the articular surface.  Fracture extends into the distal radioulnar joint.  The radial styloid is not significantly displaced.  There is no significant widening of the distal radioulnar joint.  The distal ulna is intact.  No acute carpal bone fractures are identified.  There is a well  corticated ossicle volar to the hamate consistent with an os hamuli or remote hook of hamate fracture.  Soft tissue windows demonstrate a radiocarpal joint effusion with soft tissue swelling surrounding the wrist.  Note tendon entrapment is identified.  There is an apparent tiny superficial lipoma superficial to the extensor carpi radialis tendons at the level of the trapezium.  IMPRESSION:  1.  Comminuted intra-articular fracture of the distal radius with articular surface irregularity, displacement and mild angulation as described. 2.  Intact distal ulna and carpal bones. 3.  Os hamuli versus remote hook of hamate fracture.  Original Report Authenticated By: Gerrianne Scale, M.D.    They benefited maximally from their hospital stay and there were no complications.     Disposition: Final discharge disposition not confirmed  Follow-up Information    Follow up with Sharma Covert, MD in 12 days.   Contact information:   [facility] [address] [place] [address] [phone]           Signed: Sharma Covert 10/19/2011, 10:31 AM

## 2011-10-19 NOTE — Progress Notes (Signed)
D/C instructions reviewed with patient. RX x 5 given. No hh services or equipment needed. All questions answered. Pt d/c'ed via wheelchair in stable condition. Nausea resolved prior to d/c. Pt able to void prior to d/c.

## 2012-02-03 ENCOUNTER — Emergency Department (HOSPITAL_COMMUNITY): Payer: Self-pay

## 2012-02-03 ENCOUNTER — Emergency Department (HOSPITAL_COMMUNITY)
Admission: EM | Admit: 2012-02-03 | Discharge: 2012-02-03 | Disposition: A | Discharge status: Home / Self Care | Payer: Self-pay | Attending: Emergency Medicine | Admitting: Emergency Medicine

## 2012-02-03 ENCOUNTER — Encounter (HOSPITAL_COMMUNITY): Payer: Self-pay | Admitting: Emergency Medicine

## 2012-02-03 DIAGNOSIS — W208XXA Other cause of strike by thrown, projected or falling object, initial encounter: Secondary | ICD-10-CM | POA: Insufficient documentation

## 2012-02-03 DIAGNOSIS — S60211A Contusion of right wrist, initial encounter: Secondary | ICD-10-CM

## 2012-02-03 DIAGNOSIS — F909 Attention-deficit hyperactivity disorder, unspecified type: Secondary | ICD-10-CM | POA: Insufficient documentation

## 2012-02-03 DIAGNOSIS — S60219A Contusion of unspecified wrist, initial encounter: Secondary | ICD-10-CM | POA: Insufficient documentation

## 2012-02-03 MED ORDER — HYDROCODONE-ACETAMINOPHEN 5-325 MG PO TABS: 1.0000 | ORAL_TABLET | Freq: Three times a day (TID) | ORAL | Status: AC | PRN

## 2012-02-03 MED ORDER — HYDROCODONE-ACETAMINOPHEN 5-325 MG PO TABS
1.0000 | ORAL_TABLET | Freq: Once | ORAL | Status: AC
  Administered 2012-02-03: 1 via ORAL
  Filled 2012-02-03: qty 1

## 2012-02-03 NOTE — ED Notes (Signed)
Pt went to car to get her phone charger. Reports she will be back in a few minutes.

## 2012-02-03 NOTE — ED Provider Notes (Signed)
History     CSN: [id number]  Arrival date & time 02/03/12  1854   First MD Initiated Contact with Patient 02/03/12 2128      Chief Complaint  Patient presents with  . Wrist Pain    (Consider location/radiation/quality/duration/timing/severity/associated sxs/prior treatment) HPI Comments: Patient is status post right wrist, repair from May of this year.  States she was walking when a ladder, fell, and struck her on the right wrist, right over the radial styloid.  She has a small hematoma and superficial abrasion to that area along with some bruising at the base of the thumb  Patient is a 46 y.o. female presenting with wrist pain. The history is provided by the patient.  Wrist Pain This is a new problem. The current episode started today. The problem occurs constantly. The problem has been gradually improving. Associated symptoms include joint swelling. Pertinent negatives include no chills, numbness or weakness.    Past Medical History  Diagnosis Date  . Depression     Hx ; had subsided.  . ADHD (attention deficit hyperactivity disorder) Dx'd 2008    Past Surgical History  Procedure Date  . No past surgeries   . Tonsillectomy     Family History  Problem Relation Age of Onset  . Anesthesia problems Neg Hx     History  Substance Use Topics  . Smoking status: Never Smoker   . Smokeless tobacco: Not on file  . Alcohol Use: 0.0 oz/week    3-5 Glasses of wine per week    OB History    Grav Para Term Preterm Abortions TAB SAB Ect Mult Living                  Review of Systems  Constitutional: Negative for chills.  Musculoskeletal: Positive for joint swelling.  Neurological: Negative for dizziness, weakness and numbness.    Allergies  Codeine  Home Medications   Current Outpatient Rx  Name Route Sig Dispense Refill  . AMPHETAMINE-DEXTROAMPHETAMINE 30 MG PO TABS Oral Take 15 mg by mouth 2 (two) times daily.    Marland Kitchen HYDROCHLOROTHIAZIDE 25 MG PO TABS Oral Take 25  mg by mouth daily as needed. When swelling noticed    . VITAMIN C 500 MG PO TABS Oral Take 1 tablet (500 mg total) by mouth daily. 90 tablet 0    BP 128/92  Pulse 99  Temp 98.7 F (37.1 C) (Oral)  Resp 18  SpO2 100%  Physical Exam  Constitutional: She is oriented to person, place, and time. She appears well-developed.  HENT:  Head: Normocephalic.  Eyes: Pupils are equal, round, and reactive to light.  Neck: Normal range of motion.  Cardiovascular: Normal rate.   Pulmonary/Chest: Effort normal.  Genitourinary: Vagina normal.  Musculoskeletal: She exhibits tenderness. She exhibits no edema.  Neurological: She is alert and oriented to person, place, and time.  Skin: Skin is warm.    ED Course  Procedures (including critical care time)  Labs Reviewed - No data to display Dg Wrist Complete Right  02/03/2012  *RADIOLOGY REPORT*  Clinical Data: Right wrist pain.  Prior fracture with surgery in April, 2013.  RIGHT WRIST - COMPLETE 3+ VIEW  Comparison: 10/17/2011  Findings: Distal radial plate and screw fixation noted without specific findings of complicating feature.  Expected mild deformity noted along the original fracture planes.  No new fracture or specific indicators of nonunion are observed, although CT might provide better assessment of the degree of bony bridging.  No lucency  along the screws is observed to suggest loosening/infection.  IMPRESSION:  1.  No specific complicating feature of the plate and screw fixation of the healing distal radial fracture is observed.  Original Report Authenticated By: Dellia Cloud, M.D.     No diagnosis found.    MDM   Is reassured that her previous surgery, hardware is in place.  There is no sign of new fracture.  She'll be able to followup with Dr. Melvyn Novas as needed.  I offered a Velcro cock-up splint.  She states she has one at home.  That she can use for comfort        Arman Filter, NP 02/03/12 2139

## 2012-02-03 NOTE — ED Notes (Signed)
Pt sts ladder fell onto right wrist; pt sts hx of sx on same wrist; pt c/o pain nad swelling; CMS intact

## 2012-02-04 NOTE — ED Provider Notes (Signed)
Medical screening examination/treatment/procedure(s) were performed by non-physician practitioner and as supervising physician I was immediately available for consultation/collaboration.  Flint Melter, MD 02/04/12 0120

## 2015-09-21 DIAGNOSIS — G4700 Insomnia, unspecified: Secondary | ICD-10-CM | POA: Insufficient documentation

## 2016-01-03 DIAGNOSIS — F3342 Major depressive disorder, recurrent, in full remission: Secondary | ICD-10-CM | POA: Insufficient documentation

## 2016-01-03 DIAGNOSIS — F900 Attention-deficit hyperactivity disorder, predominantly inattentive type: Secondary | ICD-10-CM | POA: Insufficient documentation

## 2016-07-30 DIAGNOSIS — R740 Nonspecific elevation of levels of transaminase and lactic acid dehydrogenase [LDH]: Secondary | ICD-10-CM

## 2016-07-30 DIAGNOSIS — R7401 Elevation of levels of liver transaminase levels: Secondary | ICD-10-CM | POA: Insufficient documentation

## 2016-09-28 ENCOUNTER — Encounter (HOSPITAL_COMMUNITY): Payer: Self-pay

## 2016-09-28 ENCOUNTER — Emergency Department (HOSPITAL_COMMUNITY)
Admission: EM | Admit: 2016-09-28 | Discharge: 2016-09-28 | Disposition: A | Discharge status: Home / Self Care | Payer: BLUE CROSS/BLUE SHIELD | Attending: Emergency Medicine | Admitting: Emergency Medicine

## 2016-09-28 ENCOUNTER — Emergency Department (HOSPITAL_COMMUNITY): Payer: BLUE CROSS/BLUE SHIELD

## 2016-09-28 DIAGNOSIS — F909 Attention-deficit hyperactivity disorder, unspecified type: Secondary | ICD-10-CM | POA: Diagnosis not present

## 2016-09-28 DIAGNOSIS — K4190 Unilateral femoral hernia, without obstruction or gangrene, not specified as recurrent: Secondary | ICD-10-CM

## 2016-09-28 DIAGNOSIS — R1031 Right lower quadrant pain: Secondary | ICD-10-CM | POA: Diagnosis present

## 2016-09-28 LAB — COMPREHENSIVE METABOLIC PANEL
ALBUMIN: 4.6 g/dL (ref 3.5–5.0)
ALT: 61 U/L — ABNORMAL HIGH (ref 14–54)
ANION GAP: 12 (ref 5–15)
AST: 83 U/L — ABNORMAL HIGH (ref 15–41)
Alkaline Phosphatase: 67 U/L (ref 38–126)
BUN: 12 mg/dL (ref 6–20)
CHLORIDE: 102 mmol/L (ref 101–111)
CO2: 26 mmol/L (ref 22–32)
Calcium: 9.9 mg/dL (ref 8.9–10.3)
Creatinine, Ser: 0.52 mg/dL (ref 0.44–1.00)
GFR calc Af Amer: >60 mL/min (ref >60)
Glucose, Bld: 109 mg/dL — ABNORMAL HIGH (ref 65–99)
POTASSIUM: 3.4 mmol/L — AB (ref 3.5–5.1)
Sodium: 140 mmol/L (ref 135–145)
Total Bilirubin: 0.8 mg/dL (ref 0.3–1.2)
Total Protein: 7.3 g/dL (ref 6.5–8.1)

## 2016-09-28 LAB — CBC WITH DIFFERENTIAL/PLATELET
BASOS ABS: 0 10*3/uL (ref 0.0–0.1)
BASOS PCT: 0 %
EOS PCT: 0 %
Eosinophils Absolute: 0 10*3/uL (ref 0.0–0.7)
HCT: 38.9 % (ref 36.0–46.0)
Hemoglobin: 13.5 g/dL (ref 12.0–15.0)
Lymphocytes Relative: 10 %
Lymphs Abs: 0.9 10*3/uL (ref 0.7–4.0)
MCH: 32.3 pg (ref 26.0–34.0)
MCHC: 34.7 g/dL (ref 30.0–36.0)
MCV: 93.1 fL (ref 78.0–100.0)
MONO ABS: 0.7 10*3/uL (ref 0.1–1.0)
Monocytes Relative: 8 %
Neutro Abs: 7.3 10*3/uL (ref 1.7–7.7)
Neutrophils Relative %: 82 %
PLATELETS: 200 10*3/uL (ref 150–400)
RBC: 4.18 MIL/uL (ref 3.87–5.11)
RDW: 12.4 % (ref 11.5–15.5)
WBC: 9 10*3/uL (ref 4.0–10.5)

## 2016-09-28 LAB — I-STAT CG4 LACTIC ACID, ED: LACTIC ACID, VENOUS: 1.89 mmol/L (ref 0.5–1.9)

## 2016-09-28 MED ORDER — HYDROMORPHONE HCL 1 MG/ML IJ SOLN
1.0000 mg | Freq: Once | INTRAMUSCULAR | Status: AC
  Administered 2016-09-28: 1 mg via INTRAVENOUS
  Filled 2016-09-28: qty 1

## 2016-09-28 MED ORDER — IOPAMIDOL (ISOVUE-300) INJECTION 61%
100.0000 mL | Freq: Once | INTRAVENOUS | Status: AC | PRN
  Administered 2016-09-28: 100 mL via INTRAVENOUS

## 2016-09-28 MED ORDER — ONDANSETRON HCL 4 MG/2ML IJ SOLN
4.0000 mg | Freq: Once | INTRAMUSCULAR | Status: AC
  Administered 2016-09-28: 4 mg via INTRAVENOUS
  Filled 2016-09-28: qty 2

## 2016-09-28 MED ORDER — SODIUM CHLORIDE 0.9 % IV BOLUS (SEPSIS)
1000.0000 mL | Freq: Once | INTRAVENOUS | Status: AC
  Administered 2016-09-28: 1000 mL via INTRAVENOUS

## 2016-09-28 MED ORDER — PROMETHAZINE HCL 25 MG/ML IJ SOLN
12.5000 mg | Freq: Once | INTRAMUSCULAR | Status: DC
  Filled 2016-09-28: qty 1

## 2016-09-28 MED ORDER — IOPAMIDOL (ISOVUE-300) INJECTION 61%
INTRAVENOUS | Status: AC
  Filled 2016-09-28: qty 100

## 2016-09-28 MED ORDER — METOCLOPRAMIDE HCL 5 MG/ML IJ SOLN
10.0000 mg | Freq: Once | INTRAMUSCULAR | Status: AC
  Administered 2016-09-28: 10 mg via INTRAVENOUS
  Filled 2016-09-28: qty 2

## 2016-09-28 MED ORDER — METOCLOPRAMIDE HCL 10 MG PO TABS: 10.0000 mg | ORAL_TABLET | Freq: Four times a day (QID) | ORAL | 0 refills | Status: DC | PRN

## 2016-09-28 MED ORDER — DIPHENHYDRAMINE HCL 50 MG/ML IJ SOLN
12.5000 mg | Freq: Once | INTRAMUSCULAR | Status: AC
  Administered 2016-09-28: 12.5 mg via INTRAVENOUS
  Filled 2016-09-28: qty 1

## 2016-09-28 MED ORDER — OXYCODONE-ACETAMINOPHEN 5-325 MG PO TABS: 1.0000 | ORAL_TABLET | Freq: Four times a day (QID) | ORAL | 0 refills | Status: DC | PRN

## 2016-09-28 NOTE — ED Notes (Signed)
She had had an episode of emesis, of which Dr. Silverio Lay was notified. Dr. Silverio Lay subsequently reduced her hernia; and as I write this, she is sleeping soundly. I defer giving the Phenergan at this time. Also she had dropped her spo2 into the 70's; and we now have her on nasal cannula at 2 l.p.m. With her spo2 immediately rising to 100%.

## 2016-09-28 NOTE — ED Triage Notes (Signed)
She c/o right groin pain since ~0200 hours. She states she has a known right inguinal hernia (diagnosed in 2006) "but it has been fine until now". She has a ping pong ball-size area of edema at right groin/femoral area. She rec'd. 4 mg of Zofran and 100 mcg of Fentanyl IV en route to E.d.

## 2016-09-28 NOTE — Discharge Instructions (Signed)
Take motrin for pain.   Take percocet for severe pain. DO NOT drive with it.   Percocet may make you constipated. You can take miralax if you are constipated.   Take reglan as needed for nausea.   Avoid heavy lifting, straining too much as it will make hernia come out more.   Call surgery office for appointment for treatment options   Return to ER if you have hernia that can't go back in, severe abdominal pain, vomiting, not passing gas, fevers.

## 2016-09-28 NOTE — ED Notes (Signed)
Bed: WA14 Expected date:  Expected time:  Means of arrival:  Comments: EMS 

## 2016-09-28 NOTE — ED Provider Notes (Signed)
[facility]-EMERGENCY DEPT Provider Note   CSN: [id number] Arrival date & time: 09/28/16  0859     History   Chief Complaint Chief Complaint  Patient presents with  . Groin Pain    HPI Rebekah Campbell is a 51 y.o. female hx of ADHD, depression, possible R inguinal hernia (diagnosed in 2005 by OB), Here presenting with right groin swelling. Patient states that her OB doctor diagnosed her with a right inguinal hernia in 2005 but she never had any imaging study done. States that it was fine until around 2 AM this morning when she woke up with sudden pain in the right Groin area. She denies straining or constipation. She did had an episode of vomiting. She arrived by EMS and was given 4 mg of Zofran, 100 g of fentanyl IV.    The history is provided by the patient.    Past Medical History:  Diagnosis Date  . ADHD (attention deficit hyperactivity disorder) Dx'd 2008  . Depression    Hx ; had subsided.    There are no active problems to display for this patient.   Past Surgical History:  Procedure Laterality Date  . NO PAST SURGERIES    . TONSILLECTOMY      OB History    No data available       Home Medications    Prior to Admission medications   Medication Sig Start Date End Date Taking? Authorizing Provider  amphetamine-dextroamphetamine (ADDERALL) 30 MG tablet Take 15 mg by mouth 2 (two) times daily.   Yes Historical Provider, MD  diphenhydramine-acetaminophen (TYLENOL PM) 25-500 MG TABS tablet Take 2 tablets by mouth at bedtime as needed (for sleep).   Yes Historical Provider, MD  hydrochlorothiazide (HYDRODIURIL) 25 MG tablet Take 25 mg by mouth daily as needed. When swelling noticed   Yes Historical Provider, MD  Melatonin 10 MG TABS Take 3 tablets by mouth at bedtime as needed (for sleep).   Yes Historical Provider, MD    Family History Family History  Problem Relation Age of Onset  . Anesthesia problems Neg Hx     Social History Social History  Substance Use  Topics  . Smoking status: Never Smoker  . Smokeless tobacco: Not on file  . Alcohol use 0.0 oz/week    3 - 5 Glasses of wine per week     Allergies   Codeine   Review of Systems Review of Systems  Gastrointestinal: Positive for abdominal pain and vomiting.  All other systems reviewed and are negative.    Physical Exam Updated Vital Signs BP 117/81 (BP Location: Left Arm)   Pulse 84   Temp 97.5 F (36.4 C) (Oral)   Resp 14   SpO2 100%   Physical Exam  Constitutional: She is oriented to person, place, and time.  Uncomfortable   HENT:  Head: Normocephalic.  Eyes: Pupils are equal, round, and reactive to light.  Neck: Normal range of motion.  Cardiovascular: Normal rate, regular rhythm and normal heart sounds.   Pulmonary/Chest: Effort normal and breath sounds normal. No respiratory distress. She has no wheezes. She has no rales.  Abdominal: Soft. Bowel sounds are normal.  + R femoral area swelling measuring around 10 cm in diameter, not reproducible, no obvious bowel sounds in it. Abdomen nontender   Musculoskeletal: Normal range of motion.  Neurological: She is alert and oriented to person, place, and time.  Skin: Skin is warm.  Psychiatric: She has a normal mood and affect.  Nursing note  and vitals reviewed.    ED Treatments / Results  Labs (all labs ordered are listed, but only abnormal results are displayed) Labs Reviewed  COMPREHENSIVE METABOLIC PANEL - Abnormal; Notable for the following:       Result Value   Potassium 3.4 (*)    Glucose, Bld 109 (*)    AST 83 (*)    ALT 61 (*)    All other components within normal limits  CBC WITH DIFFERENTIAL/PLATELET  I-STAT CG4 LACTIC ACID, ED    EKG  EKG Interpretation None       Radiology Dg Abd Acute W/chest  Result Date: 09/28/2016 CLINICAL DATA:  Patient c/o mid abd pain since midnight last night. She states she has a known right inguinal hernia (diagnosed in 2006) "but it has been fine until now".  She has a ping pong ball-size area of edema at right groin/femoral area, Patient denies CP or SOB EXAM: DG ABDOMEN ACUTE W/ 1V CHEST COMPARISON:  None. FINDINGS: There is dilated small bowel in the central and left mid abdomen with associated air-fluid levels on the erect view. Some air and stool is seen within a normal caliber colon. There is no free air. No evidence of a renal or ureteral stone. The heart, mediastinum and hila are unremarkable. The lungs are clear. IMPRESSION: 1. Small bowel dilation with air-fluid levels consistent with a partial small bowel obstruction. No free air. 2. No acute cardiopulmonary disease. Electronically Signed   By: Amie Portland M.D.   On: 09/28/2016 09:43    Procedures Hernia reduction Date/Time: 09/28/2016 10:26 AM Performed by: Charlynne Pander Authorized by: Charlynne Pander  Consent: Verbal consent obtained. Risks and benefits: risks, benefits and alternatives were discussed Consent given by: patient Patient understanding: patient states understanding of the procedure being performed Patient consent: the patient's understanding of the procedure matches consent given Required items: required blood products, implants, devices, and special equipment available Patient identity confirmed: verbally with patient Local anesthesia used: no  Anesthesia: Local anesthesia used: no  Sedation: Patient sedated: no Patient tolerance: Patient tolerated the procedure well with no immediate complications Comments: R femoral hernia reduced successfully     (including critical care time)    Medications Ordered in ED Medications  metoCLOPramide (REGLAN) injection 10 mg (not administered)  diphenhydrAMINE (BENADRYL) injection 12.5 mg (not administered)  sodium chloride 0.9 % bolus 1,000 mL (1,000 mLs Intravenous New Bag/Given 09/28/16 0950)  HYDROmorphone (DILAUDID) injection 1 mg (1 mg Intravenous Given 09/28/16 0921)  ondansetron (ZOFRAN) injection 4 mg (4 mg  Intravenous Given 09/28/16 0921)     Initial Impression / Assessment and Plan / ED Course  I have reviewed the triage vital signs and the nursing notes.  Pertinent labs & imaging results that were available during my care of the patient were reviewed by me and considered in my medical decision making (see chart for details).     Rebekah Campbell is a 51 y.o. female here with R groin swelling and pain. Consider femoral hernia vs abscess vs mass vs incarcerated hernia. Will get labs, lactate, acute abdominal series. Will give pain meds and attempt to reduce.   10 am Lactate nl. xrays showed possible partial SBO likely from femoral hernia. I reduced the hernia successfully. Will PO trial.   10:30 am I gave patient some gingerale and she vomited again. I wonder if she has persistent ileus vs partial SBO even though hernia was reduced. Will give reglan. Will get CT ab/pel.  12:09 PM CT showed no SBO. R femoral hernia reduced successfully on CT. Tolerated PO after zofran and reglan. Will dc home with reglan prn, percocet prn pain. Will refer to surgery for definitive treatment. Told her not to strain too much and no heavy lifting.      Final Clinical Impressions(s) / ED Diagnoses   Final diagnoses:  None    New Prescriptions New Prescriptions   No medications on file     Charlynne Pander, MD 09/28/16 1211

## 2016-09-28 NOTE — ED Notes (Signed)
She has just had another small emesis--new orders rec'd. From Dr. Silverio Lay.

## 2016-09-28 NOTE — ED Notes (Signed)
She arouses easily from sleep and tells me she feels "much better".

## 2016-09-29 ENCOUNTER — Ambulatory Visit (INDEPENDENT_AMBULATORY_CARE_PROVIDER_SITE_OTHER): Payer: BLUE CROSS/BLUE SHIELD | Admitting: Family Medicine

## 2016-09-29 ENCOUNTER — Encounter: Payer: Self-pay | Admitting: Family Medicine

## 2016-09-29 VITALS — BP 118/76 | HR 95 | Temp 97.9°F | Ht 64.0 in | Wt 129.0 lb

## 2016-09-29 DIAGNOSIS — K4090 Unilateral inguinal hernia, without obstruction or gangrene, not specified as recurrent: Secondary | ICD-10-CM

## 2016-09-29 NOTE — Progress Notes (Signed)
Rebekah Campbell is a 51 y.o. female is here to Rebekah Campbell.   History of Present Illness:   Britt Bottom CMA acting as scribe for Dr. Earlene Plater.  CC: Patient here today to Establish care. She was in the ED Sunday for right side abdominal pain.   HPI: ER FOLLOW UP. Right inguinal hernia. Reduced. Small bulge still. No overt pain. Normal stools.   CT scan: IMPRESSION: 1. Findings are consistent with the recent reduction of a right inguinal hernia. There is a small amount of fluid and residual herniated fat protruding through the right superficial inguinal ring. Mild small bowel distention is noted without a discrete transition point. This is consistent with the small bowel having been herniated prior to reduction.  Health Maintenance Due  Topic Date Due  . HIV Screening  11/17/1979  . TETANUS/TDAP  11/17/1983  . PAP SMEAR  11/16/1985  . MAMMOGRAM  11/17/2014  . COLONOSCOPY  11/17/2014   PMHx, SurgHx, SocialHx, Medications, and Allergies were reviewed in the Visit Navigator and updated as appropriate.   Past Medical History:  Diagnosis Date  . ADHD (attention deficit hyperactivity disorder)   . Depression   . HTN (hypertension)   . Inguinal hernia of right side with obstruction and without gangrene    Past Surgical History:  Procedure Laterality Date  . NO PAST SURGERIES    . TONSILLECTOMY     Family History  Problem Relation Age of Onset  . Anesthesia problems Neg Hx    Social History  Substance Use Topics  . Smoking status: Never Smoker  . Smokeless tobacco: Never Used  . Alcohol use 0.0 oz/week    3 - 5 Glasses of wine per week   Current Medications and Allergies:   .  amphetamine-dextroamphetamine (ADDERALL) 30 MG tablet, Take 15 mg by mouth 2 (two) times daily., Disp: , Rfl:  .  diphenhydramine-acetaminophen (TYLENOL PM) 25-500 MG TABS tablet, Take 2 tablets by mouth at bedtime as needed (for sleep)., Disp: , Rfl:  .  hydrochlorothiazide (HYDRODIURIL) 25 MG  tablet, Take 25 mg by mouth daily as needed. When swelling noticed, Disp: , Rfl:  .  Melatonin 10 MG TABS, Take 3 tablets by mouth at bedtime as needed (for sleep)., Disp: , Rfl:  .  metoCLOPramide (REGLAN) 10 MG tablet, Take 1 tablet (10 mg total) by mouth every 6 (six) hours as needed for nausea (nausea/headache)., Disp: 10 tablet, Rfl: 0 .  oxyCODONE-acetaminophen (PERCOCET) 5-325 MG tablet, Take 1 tablet by mouth every 6 (six) hours as needed., Disp: 8 tablet, Rfl: 0    Allergies  Allergen Reactions  . Codeine Nausea And Vomiting   Review of Systems:   Review of Systems  Constitutional: Negative for chills, fever and malaise/fatigue.  HENT: Negative for congestion, ear discharge and sinus pain.   Eyes: Negative for blurred vision and double vision.  Respiratory: Negative for cough and shortness of breath.   Cardiovascular: Negative for chest pain, palpitations and leg swelling.  Gastrointestinal: Negative for blood in stool, constipation, diarrhea, melena, nausea and vomiting.  Genitourinary: Negative for dysuria.  Neurological: Negative for dizziness and headaches.  Psychiatric/Behavioral: Negative for depression, hallucinations and memory loss.    Vitals:   Vitals:   09/29/16 1206  BP: 118/76  Pulse: 95  Temp: 97.9 F (36.6 C)  TempSrc: Oral  SpO2: 98%  Weight: 129 lb (58.5 kg)  Height:  (1.626 m)     Body mass index is 22.14 kg/m.  Physical Exam:   Physical Exam  Constitutional: She appears well-developed and well-nourished. No distress.  HENT:  Head: Normocephalic and atraumatic.  Eyes: EOM are normal. Pupils are equal, round, and reactive to light.  Neck: Normal range of motion. Neck supple.  Cardiovascular: Normal rate, regular rhythm, normal heart sounds and intact distal pulses.   Pulmonary/Chest: Effort normal.  Abdominal: Soft. Bowel sounds are normal. A hernia is present. Hernia confirmed positive in the right inguinal area.  Skin: Skin is  warm.  Psychiatric: She has a normal mood and affect. Her behavior is normal.  Nursing note and vitals reviewed.    Assessment and Plan:    [name] was seen today for establish care.  Diagnoses and all orders for this visit:  Inguinal hernia of right side without obstruction or gangrene Comments: Red flags reviewed. Orders: -     Ambulatory referral to General Surgery    . Reviewed expectations re: course of current medical issues. . Discussed self-management of symptoms. . Outlined signs and symptoms indicating need for more acute intervention. . Patient verbalized understanding and all questions were answered. . See orders for this visit as documented in the electronic medical record. . Patient received an After Visit Summary.  Records requested if needed. I spent 30 minutes with this patient, greater than 50% was face-to-face time counseling regarding the above diagnoses.  CMA served as Neurosurgeon during this visit. History, Physical, and Plan performed by medical provider. Documentation and orders reviewed and attested to. Helane Rima, D.O.  Helane Rima, D.O. , [facility] 09/29/2016   Follow-up: 2 months.  There are no discontinued medications. Orders Placed This Encounter  Procedures  . Ambulatory referral to General Surgery

## 2016-09-29 NOTE — Progress Notes (Signed)
Pre visit review using our clinic review tool, if applicable. No additional management support is needed unless otherwise documented below in the visit note. 

## 2016-10-01 ENCOUNTER — Ambulatory Visit: Payer: BLUE CROSS/BLUE SHIELD | Admitting: Family Medicine

## 2016-10-01 ENCOUNTER — Telehealth: Payer: Self-pay | Admitting: Family Medicine

## 2016-10-01 NOTE — Telephone Encounter (Signed)
Please advise 

## 2016-10-01 NOTE — Telephone Encounter (Signed)
Patient wants to make sure it's ok for her to walk for four [place] on [place] for a few hours a day, considering her health concerns. She's worried that her innards will come out.  Please note the patient seems a little unravelled.  Thank you,  -LL

## 2016-10-01 NOTE — Telephone Encounter (Signed)
Patient notified.  Verbalized understanding. 

## 2016-10-01 NOTE — Telephone Encounter (Signed)
Okay to walk as long as she does not exert herself too much. I don't want her holding anything heavy. If she has pain, stop.

## 2016-10-09 ENCOUNTER — Telehealth: Payer: Self-pay | Admitting: Surgical

## 2016-10-09 NOTE — Telephone Encounter (Addendum)
Patient is calling in to let you know that the surgeon said that it would be several weeks before they can do the surgery. Patient is wanting referral to another surgeon. I explained that if we do this it can push the surgery back even further. She will call back tomorrow to let us know if she needs the referral. I advised the patient that pain gets severe then she will need to go to the ED because they have surgeons on call and if surgery is immediate then they can do it at that time.

## 2016-10-13 ENCOUNTER — Encounter: Payer: Self-pay | Admitting: Family Medicine

## 2016-10-13 ENCOUNTER — Ambulatory Visit (INDEPENDENT_AMBULATORY_CARE_PROVIDER_SITE_OTHER): Payer: BLUE CROSS/BLUE SHIELD | Admitting: Family Medicine

## 2016-10-13 VITALS — BP 148/86 | HR 115 | Temp 98.0°F | Wt 128.8 lb

## 2016-10-13 DIAGNOSIS — F419 Anxiety disorder, unspecified: Secondary | ICD-10-CM | POA: Diagnosis not present

## 2016-10-13 DIAGNOSIS — K4090 Unilateral inguinal hernia, without obstruction or gangrene, not specified as recurrent: Secondary | ICD-10-CM

## 2016-10-13 MED ORDER — CLONAZEPAM 0.5 MG PO TABS: 0.5000 mg | ORAL_TABLET | Freq: Two times a day (BID) | ORAL | 1 refills | Status: DC | PRN

## 2016-10-13 NOTE — Progress Notes (Signed)
Rebekah Campbell is a 51 y.o. female is here to discuss:  History of Present Illness:   Rebekah Campbell, CMA, acting as Neurosurgeon for [employer].  Chief Complaint  Patient presents with  . Follow-up    Patient stated that the doctor had other things to discuss.    Anxiety  Presents for initial visit. Onset was 6 to 12 months ago. The problem has been gradually worsening. Symptoms include depressed mood, excessive worry, irritability and nervous/anxious behavior. Patient reports no chest pain, dizziness, palpitations, shortness of breath or suicidal ideas. Symptoms occur most days. The severity of symptoms is moderate. Exacerbated by: health issues, work stress, relationship issues. The quality of sleep is fair. Nighttime awakenings: occasional.     Health Maintenance Due  Topic Date Due  . HIV Screening  11/17/1979  . TETANUS/TDAP  11/17/1983  . PAP SMEAR  11/16/1985  . MAMMOGRAM  11/17/2014  . COLONOSCOPY  11/17/2014    PMHx, SurgHx, SocialHx, FamHx, Medications, and Allergies were reviewed in the Visit Navigator and updated as appropriate.   Patient Active Problem List   Diagnosis Date Noted  . Inguinal hernia of right side without obstruction or gangrene 09/29/2016  . Elevated AST (SGOT) 07/30/2016  . Attention deficit hyperactivity disorder (ADHD), predominantly inattentive type 01/03/2016  . Recurrent major depressive disorder, in full remission (HCC) 01/03/2016  . Insomnia 09/21/2015   Social History  Substance Use Topics  . Smoking status: Never Smoker  . Smokeless tobacco: Never Used  . Alcohol use 0.0 oz/week    3 - 5 Glasses of wine per week   Current Medications and Allergies:   Current Outpatient Prescriptions:  .  amphetamine-dextroamphetamine (ADDERALL) 30 MG tablet, Take 15 mg by mouth 2 (two) times daily., Disp: , Rfl:  .  diphenhydramine-acetaminophen (TYLENOL PM) 25-500 MG TABS tablet, Take 2 tablets by mouth at bedtime as needed (for sleep)., Disp: , Rfl:    .  hydrochlorothiazide (HYDRODIURIL) 25 MG tablet, Take 25 mg by mouth daily as needed. When swelling noticed, Disp: , Rfl:  .  Melatonin 10 MG TABS, Take 3 tablets by mouth at bedtime as needed (for sleep)., Disp: , Rfl:  .  metoCLOPramide (REGLAN) 10 MG tablet, Take 1 tablet (10 mg total) by mouth every 6 (six) hours as needed for nausea (nausea/headache)., Disp: 10 tablet, Rfl: 0 .  clonazePAM (KLONOPIN) 0.5 MG tablet, Take 1 tablet (0.5 mg total) by mouth 2 (two) times daily as needed for anxiety., Disp: 20 tablet, Rfl: 1 No current facility-administered medications for this visit.   Facility-Administered Medications Ordered in Other Visits:  .  fentaNYL (SUBLIMAZE) injection, , , PRN, Jeani Hawking, CRNA, 50 mcg at 10/18/11 0926 .  lidocaine (cardiac) 100 mg/49ml (XYLOCAINE) 20 MG/ML injection 2%, , , PRN, Jeani Hawking, CRNA, 80 mg at 10/18/11 0920 .  ondansetron (ZOFRAN) injection, , , PRN, Jeani Hawking, CRNA, 4 mg at 10/18/11 1527 .  propofol (DIPRIVAN) 10 mg/mL bolus, , , PRN, Jeani Hawking, CRNA, 200 mg at 10/18/11 0920  Allergies  Allergen Reactions  . Codeine Nausea And Vomiting   Review of Systems   Review of Systems  Constitutional: Positive for irritability. Negative for chills, fever and malaise/fatigue.  HENT: Negative for congestion, ear pain, sinus pain and sore throat.   Eyes: Negative for blurred vision and double vision.  Respiratory: Negative for cough, shortness of breath and wheezing.   Cardiovascular: Negative for chest pain, palpitations and leg swelling.  Gastrointestinal:  Negative for abdominal pain, constipation, diarrhea and vomiting.  Genitourinary: Negative for dysuria.  Musculoskeletal: Negative for back pain, joint pain and neck pain.  Neurological: Negative for dizziness and headaches.  Psychiatric/Behavioral: Negative for depression, hallucinations, memory loss and suicidal ideas. The patient is nervous/anxious.     Vitals:   Vitals:    10/13/16 1450  BP: (!) 148/86  Pulse: (!) 115  Temp: 98 F (36.7 C)  TempSrc: Oral  SpO2: 97%  Weight: 128 lb 12.8 oz (58.4 kg)     Body mass index is 22.11 kg/m.   Physical Exam:    Physical Exam  Constitutional: She appears well-nourished.  HENT:  Head: Normocephalic and atraumatic.  Eyes: EOM are normal. Pupils are equal, round, and reactive to light.  Neck: Normal range of motion. Neck supple.  Cardiovascular: Normal rate, regular rhythm, normal heart sounds and intact distal pulses.   Pulmonary/Chest: Effort normal.  Abdominal: Soft.  Skin: Skin is warm.  Psychiatric: Her mood appears anxious. She is agitated.  Nursing note and vitals reviewed.    Assessment and Plan:    Rebekah Campbell was seen today for follow-up.  Diagnoses and all orders for this visit:  Anxiety Comments: Situational stress. Okay low dose Klonopin to be used very sparingly, short-term. I recommend therapy with Rebekah Campbell, [facility]. Orders: -     clonazePAM (KLONOPIN) 0.5 MG tablet; Take 1 tablet (0.5 mg total) by mouth 2 (two) times daily as needed for anxiety.  Inguinal hernia of right side without obstruction or gangrene Comments: I reassured the patient that it was okay that her surgery was being scheduled and not urgent. We reviewed red flags.     . Reviewed expectations re: course of current medical issues. . Discussed self-management of symptoms. . Outlined signs and symptoms indicating need for more acute intervention. . Patient verbalized understanding and all questions were answered. . See orders for this visit as documented in the electronic medical record. . Patient received an After Visit Summary.   CMA served as Neurosurgeon during this visit. History, Physical, and Plan performed by medical provider. Documentation and orders reviewed and attested to. Helane Rima, D.O.  Helane Rima, D.O. [place], [facility] 10/14/2016  Follow-up: No Follow-up on file.

## 2016-10-13 NOTE — Progress Notes (Signed)
Pre visit review using our clinic review tool, if applicable. No additional management support is needed unless otherwise documented below in the visit note. 

## 2016-10-14 ENCOUNTER — Telehealth: Payer: Self-pay

## 2016-10-14 NOTE — Telephone Encounter (Signed)
On patient's behalf, attempted to contact [facility] to schedule appointment for patient's hernia surgery.  Per [facility], patient should contact them if she hasn't heard from them before 10/21/2016.  Called patient and was unable to reach her.  Left voicemail explaining the above.  Also left the number to [facility], LCSW, so patient can schedule appointment for counseling.

## 2016-10-21 ENCOUNTER — Telehealth: Payer: Self-pay | Admitting: Emergency Medicine

## 2016-10-21 NOTE — Telephone Encounter (Signed)
Please advise 

## 2016-10-21 NOTE — Telephone Encounter (Signed)
Patient calls and states that she talked with Dr. Earlene Plater and was referred to Dr. Gaynelle Adu for surgery consult. Patient seen provider and was informed that surgery would happen when an operating room became available. No date was given. She states that she discussed this concern with Dr. Earlene Plater and Dr. Earlene Plater expressed to her that she would refer her to a MD outside of the [facility] network. Patient is calling to see what the progress is because she hasn't heard from any one. Patient states that's she thought this was an urgent matter. Patient would like a call back to see what the status is and an explanation on if this is in fact an urgent matter. I informed patient that I would forward her concern to Dr. Philis Pique assistant.

## 2016-10-21 NOTE — Telephone Encounter (Signed)
Called and spoke with pt informing patient of Dr. Philis Pique recommendations. Patient does not want to see a [facility] doctor and is willing to wait. Patient would like a MD in the [facility] network.

## 2016-10-21 NOTE — Telephone Encounter (Signed)
I reassured the patient that this is not urgent. Don't do heavy lifting. Monitor pain. Go to ER if red flags. She was supposed to get a call by today from [facility] or instructions were to call back for surgery appointment.

## 2016-10-22 ENCOUNTER — Other Ambulatory Visit: Payer: Self-pay

## 2016-10-22 DIAGNOSIS — K4090 Unilateral inguinal hernia, without obstruction or gangrene, not specified as recurrent: Secondary | ICD-10-CM

## 2016-10-22 NOTE — Telephone Encounter (Signed)
Forwarding to [facility] as I am out of the office today.

## 2016-10-22 NOTE — Telephone Encounter (Signed)
Referral has been faxed to [facility]

## 2016-10-22 NOTE — Telephone Encounter (Signed)
Okay with me. Please take care of this for me. Dx is right inguinal hernia. Referral to general surgeon. Notes in chart. She would like want [facility].

## 2016-10-22 NOTE — Telephone Encounter (Signed)
Referral has been placed to [facility].

## 2016-11-08 ENCOUNTER — Other Ambulatory Visit: Payer: Self-pay | Admitting: Family Medicine

## 2016-11-08 DIAGNOSIS — F419 Anxiety disorder, unspecified: Secondary | ICD-10-CM

## 2016-11-10 ENCOUNTER — Telehealth: Payer: Self-pay | Admitting: Family Medicine

## 2016-11-10 NOTE — Telephone Encounter (Signed)
Can you refill this for the patient since Dr. Earlene PlaterWallace is out of the office?

## 2016-11-10 NOTE — Telephone Encounter (Signed)
RX faxed to pharmacy.

## 2016-11-10 NOTE — Telephone Encounter (Signed)
Noted  

## 2016-11-10 NOTE — Telephone Encounter (Signed)
Patient wanted to relay that hernia surgery went well, and to thank all of you for everything and was so glad that you guys helped them.

## 2016-12-11 ENCOUNTER — Other Ambulatory Visit: Payer: Self-pay | Admitting: Physician Assistant

## 2016-12-11 DIAGNOSIS — F419 Anxiety disorder, unspecified: Secondary | ICD-10-CM

## 2016-12-12 NOTE — Telephone Encounter (Signed)
[place] Controlled Substance Database reviewed today regarding patient. Patient is compliant with [facility] regarding pharmacy use and one-prescribing provider.  It is appropriate to continue current medication regimen.   Jarold MottoSamantha Shreshta Medley PA-C

## 2016-12-12 NOTE — Telephone Encounter (Signed)
RX faxed

## 2016-12-12 NOTE — Telephone Encounter (Signed)
Please advise on refill.

## 2017-01-15 ENCOUNTER — Telehealth: Payer: Self-pay | Admitting: Family Medicine

## 2017-01-15 NOTE — Telephone Encounter (Signed)
Please call back b/c patient wants to know if she needs mammo, and if we have records from 2015 that we pulled on her behalf from prior office. She wants a copy of a procedure she had from that time frame if we have it.  Thank you,  -LL

## 2017-01-16 NOTE — Telephone Encounter (Signed)
Okay to order mammogram

## 2017-01-16 NOTE — Telephone Encounter (Signed)
Informed patient that we do not have any records from her previous provider.  Advised that we can request records again.  Patient went on a 15 minute rant about her previous provider holding her records hostage.  Patient states her last mammogram was in 2015.  Wanted to know if she needs to have one every year.  I advised that it is recommended she have a mammogram every year.  Asked if she would like Dr. Earlene PlaterWallace to order a mammogram or if she has a gynecologist who will order for her.  Patient stated she doesn't know the name of her gynecologist and that's why she needs records.  Asked if she was supposed to [facility] and ask gynecology offices if she was their patient.  Then asked that Dr. Earlene PlaterWallace order mammogram.  Ok to order?

## 2017-01-19 ENCOUNTER — Other Ambulatory Visit: Payer: Self-pay

## 2017-01-19 DIAGNOSIS — Z1231 Encounter for screening mammogram for malignant neoplasm of breast: Secondary | ICD-10-CM

## 2017-01-19 NOTE — Telephone Encounter (Signed)
Order placed

## 2017-01-20 ENCOUNTER — Other Ambulatory Visit: Payer: Self-pay | Admitting: Family Medicine

## 2017-01-20 DIAGNOSIS — Z1231 Encounter for screening mammogram for malignant neoplasm of breast: Secondary | ICD-10-CM

## 2017-01-21 ENCOUNTER — Ambulatory Visit: Payer: BLUE CROSS/BLUE SHIELD

## 2017-01-27 ENCOUNTER — Ambulatory Visit: Payer: BLUE CROSS/BLUE SHIELD

## 2017-02-02 ENCOUNTER — Inpatient Hospital Stay: Admission: RE | Admit: 2017-02-02 | Payer: BLUE CROSS/BLUE SHIELD | Source: Ambulatory Visit

## 2017-02-05 ENCOUNTER — Other Ambulatory Visit: Payer: Self-pay | Admitting: Family Medicine

## 2017-02-13 ENCOUNTER — Ambulatory Visit: Payer: BLUE CROSS/BLUE SHIELD

## 2017-02-13 ENCOUNTER — Emergency Department (HOSPITAL_COMMUNITY)
Admission: EM | Admit: 2017-02-13 | Discharge: 2017-02-14 | Disposition: A | Discharge status: Home / Self Care | Payer: BLUE CROSS/BLUE SHIELD | Attending: Emergency Medicine | Admitting: Emergency Medicine

## 2017-02-13 ENCOUNTER — Emergency Department (HOSPITAL_COMMUNITY): Payer: BLUE CROSS/BLUE SHIELD

## 2017-02-13 ENCOUNTER — Telehealth: Payer: Self-pay | Admitting: Family Medicine

## 2017-02-13 ENCOUNTER — Encounter (HOSPITAL_COMMUNITY): Payer: Self-pay | Admitting: Emergency Medicine

## 2017-02-13 DIAGNOSIS — K469 Unspecified abdominal hernia without obstruction or gangrene: Secondary | ICD-10-CM | POA: Insufficient documentation

## 2017-02-13 DIAGNOSIS — K56699 Other intestinal obstruction unspecified as to partial versus complete obstruction: Secondary | ICD-10-CM | POA: Diagnosis not present

## 2017-02-13 DIAGNOSIS — R1031 Right lower quadrant pain: Secondary | ICD-10-CM | POA: Diagnosis present

## 2017-02-13 LAB — COMPREHENSIVE METABOLIC PANEL
ALT: 35 U/L (ref 14–54)
ANION GAP: 11 (ref 5–15)
AST: 59 U/L — ABNORMAL HIGH (ref 15–41)
Albumin: 4.4 g/dL (ref 3.5–5.0)
Alkaline Phosphatase: 97 U/L (ref 38–126)
BUN: 11 mg/dL (ref 6–20)
CHLORIDE: 101 mmol/L (ref 101–111)
CO2: 27 mmol/L (ref 22–32)
CREATININE: 0.71 mg/dL (ref 0.44–1.00)
Calcium: 9.6 mg/dL (ref 8.9–10.3)
Glucose, Bld: 101 mg/dL — ABNORMAL HIGH (ref 65–99)
POTASSIUM: 3.9 mmol/L (ref 3.5–5.1)
SODIUM: 139 mmol/L (ref 135–145)
Total Bilirubin: 0.7 mg/dL (ref 0.3–1.2)
Total Protein: 7.8 g/dL (ref 6.5–8.1)

## 2017-02-13 LAB — URINALYSIS, ROUTINE W REFLEX MICROSCOPIC
BILIRUBIN URINE: NEGATIVE
Glucose, UA: NEGATIVE mg/dL
HGB URINE DIPSTICK: NEGATIVE
Ketones, ur: NEGATIVE mg/dL
Nitrite: NEGATIVE
PH: 7 (ref 5.0–8.0)
Protein, ur: NEGATIVE mg/dL
SPECIFIC GRAVITY, URINE: 1.011 (ref 1.005–1.030)

## 2017-02-13 LAB — CBC
HCT: 40.9 % (ref 36.0–46.0)
HEMOGLOBIN: 14.4 g/dL (ref 12.0–15.0)
MCH: 33.3 pg (ref 26.0–34.0)
MCHC: 35.2 g/dL (ref 30.0–36.0)
MCV: 94.5 fL (ref 78.0–100.0)
PLATELETS: 260 10*3/uL (ref 150–400)
RBC: 4.33 MIL/uL (ref 3.87–5.11)
RDW: 12.2 % (ref 11.5–15.5)
WBC: 9.6 10*3/uL (ref 4.0–10.5)

## 2017-02-13 LAB — LIPASE, BLOOD: LIPASE: 27 U/L (ref 11–51)

## 2017-02-13 MED ORDER — FENTANYL CITRATE (PF) 100 MCG/2ML IJ SOLN
50.0000 ug | INTRAMUSCULAR | Status: AC | PRN
  Administered 2017-02-13 (×2): 50 ug via NASAL
  Filled 2017-02-13 (×2): qty 2

## 2017-02-13 MED ORDER — IOPAMIDOL (ISOVUE-300) INJECTION 61%
100.0000 mL | Freq: Once | INTRAVENOUS | Status: AC | PRN
  Administered 2017-02-13: 100 mL via INTRAVENOUS

## 2017-02-13 MED ORDER — SODIUM CHLORIDE 0.9 % IV BOLUS (SEPSIS)
1000.0000 mL | Freq: Once | INTRAVENOUS | Status: AC
  Administered 2017-02-13: 1000 mL via INTRAVENOUS

## 2017-02-13 MED ORDER — IOPAMIDOL (ISOVUE-300) INJECTION 61%
INTRAVENOUS | Status: AC
  Filled 2017-02-13: qty 100

## 2017-02-13 MED ORDER — HYDROMORPHONE HCL 1 MG/ML IJ SOLN
1.0000 mg | Freq: Once | INTRAMUSCULAR | Status: AC
  Administered 2017-02-13: 1 mg via INTRAVENOUS
  Filled 2017-02-13: qty 1

## 2017-02-13 MED ORDER — ONDANSETRON HCL 4 MG/2ML IJ SOLN
4.0000 mg | Freq: Once | INTRAMUSCULAR | Status: AC
  Administered 2017-02-13: 4 mg via INTRAVENOUS
  Filled 2017-02-13: qty 2

## 2017-02-13 MED ORDER — HYDROMORPHONE HCL 1 MG/ML IJ SOLN
1.0000 mg | Freq: Once | INTRAMUSCULAR | Status: DC
  Filled 2017-02-13: qty 1

## 2017-02-13 MED ORDER — HYDROMORPHONE HCL 1 MG/ML IJ SOLN
1.0000 mg | Freq: Once | INTRAMUSCULAR | Status: AC
  Administered 2017-02-13: 1 mg via INTRAVENOUS

## 2017-02-13 NOTE — Telephone Encounter (Signed)
Patient checked into ED 

## 2017-02-13 NOTE — ED Triage Notes (Signed)
Pt states that she had abd hernia repaired in May.  Began having pain and inguinal swelling 2 days ago and vomiting.  Pt states the pain feels the same as it was then.  Pt states that her inguinal area where the surgery was is "sticking out".

## 2017-02-13 NOTE — ED Provider Notes (Signed)
[facility]-EMERGENCY DEPT Provider Note   CSN: [id number] Arrival date & time: 02/13/17  1537     History   Chief Complaint Chief Complaint  Patient presents with  . Hernia  . Post-op Problem    HPI Rebekah Campbell is a 51 y.o. female.  51 year old female who has history of right inguinal hernia status post repair on May 13. She's had no symptoms until week ago she started back to her normal activity and exercise routine. Since then she's had an acute worsening of the swelling and pain in her right inguinal area similar to last time it was strangulated. Had multiple episodes of vomiting over the last 24 hours with worsening of symptoms. Has not taken any for pain at home. Her surgeon was at [facility].      Past Medical History:  Diagnosis Date  . ADHD (attention deficit hyperactivity disorder)   . Depression   . HTN (hypertension)   . Inguinal hernia of right side with obstruction and without gangrene     Patient Active Problem List   Diagnosis Date Noted  . Inguinal hernia of right side without obstruction or gangrene 09/29/2016  . Elevated AST (SGOT) 07/30/2016  . Attention deficit hyperactivity disorder (ADHD), predominantly inattentive type 01/03/2016  . Recurrent major depressive disorder, in full remission (HCC) 01/03/2016  . Insomnia 09/21/2015    Past Surgical History:  Procedure Laterality Date  . NO PAST SURGERIES    . TONSILLECTOMY      OB History    No data available       Home Medications    Prior to Admission medications   Medication Sig Start Date End Date Taking? Authorizing Provider  acetaminophen (TYLENOL) 500 MG tablet Take 500 mg by mouth every 6 (six) hours as needed for mild pain.   Yes [provider]  amphetamine-dextroamphetamine (ADDERALL) 30 MG tablet Take 15 mg by mouth 2 (two) times daily.   Yes [provider]  clonazePAM (KLONOPIN) 0.5 MG tablet TAKE 1 TABLET BY MOUTH TWICE DAILY AS NEEDED FOR ANXIETY Patient taking  differently: TAKE 0.5mg  BY MOUTH TWICE DAILY AS NEEDED FOR ANXIETY 12/12/16  Yes Jarold Motto, PA  hydrochlorothiazide (HYDRODIURIL) 25 MG tablet TAKE 1 TABLET BY MOUTH DAILY Patient taking differently: take 25mg  by mouth daily as needed for bloating 02/05/17  Yes Helane Rima, DO  Melatonin 10 MG TABS Take 3 tablets by mouth at bedtime as needed (for sleep).   Yes [provider]  metoCLOPramide (REGLAN) 10 MG tablet Take 1 tablet (10 mg total) by mouth every 6 (six) hours as needed for nausea (nausea/headache). 09/28/16   Charlynne Pander, MD    Family History Family History  Problem Relation Age of Onset  . Anesthesia problems Neg Hx     Social History Social History  Substance Use Topics  . Smoking status: Never Smoker  . Smokeless tobacco: Never Used  . Alcohol use 0.0 oz/week    3 - 5 Glasses of wine per week     Allergies   Codeine   Review of Systems Review of Systems  All other systems reviewed and are negative.    Physical Exam Updated Vital Signs BP 136/88 (BP Location: Right Arm)   Pulse 92   Temp 98.5 F (36.9 C) (Oral)   Resp 20   SpO2 98%   Physical Exam  Constitutional: She appears well-developed and well-nourished.  HENT:  Head: Normocephalic and atraumatic.  Eyes: Conjunctivae and EOM are normal.  Neck:  Normal range of motion.  Cardiovascular: Normal rate and regular rhythm.   Pulmonary/Chest: No stridor. No respiratory distress.  Abdominal: Soft. Bowel sounds are normal. She exhibits no distension. A hernia (swelling in right inguinal area) is present.  Neurological: She is alert.  Skin: Skin is warm and dry.  Nursing note and vitals reviewed.    ED Treatments / Results  Labs (all labs ordered are listed, but only abnormal results are displayed) Labs Reviewed  LIPASE, BLOOD  COMPREHENSIVE METABOLIC PANEL  CBC  URINALYSIS, ROUTINE W REFLEX MICROSCOPIC    EKG  EKG Interpretation None       Radiology No results  found.  Procedures Hernia reduction Date/Time: 02/14/2017 11:37 AM Performed by: Marily Memos Authorized by: Marily Memos  Consent: Verbal consent obtained. Risks and benefits: risks, benefits and alternatives were discussed Consent given by: patient Patient understanding: patient states understanding of the procedure being performed Patient consent: the patient's understanding of the procedure matches consent given Procedure consent: procedure consent matches procedure scheduled Required items: required blood products, implants, devices, and special equipment available Patient identity confirmed: verbally with patient Local anesthesia used: no  Anesthesia: Local anesthesia used: no  Sedation: Patient sedated: no Patient tolerance: Patient tolerated the procedure well with no immediate complications Comments: Unsuccessful attempts with pressure, ice, pain control and trendelenburg.     (including critical care time)  Medications Ordered in ED Medications  fentaNYL (SUBLIMAZE) injection 50 mcg (50 mcg Nasal Given 02/13/17 1716)  HYDROmorphone (DILAUDID) injection 1 mg (not administered)  ondansetron (ZOFRAN) injection 4 mg (not administered)  sodium chloride 0.9 % bolus 1,000 mL (not administered)     Initial Impression / Assessment and Plan / ED Course  I have reviewed the triage vital signs and the nursing notes.  Pertinent labs & imaging results that were available during my care of the patient were reviewed by me and considered in my medical decision making (see chart for details).    Likely repeat hernia vs seroma vs hematoma. Will give pain medication to hopefully allow for better evaluation and reduction if necessasry.  Attempted reduction without success. CT done and shows bowel obstruction secondary to hernia. Attempted reduction again without success. Will transfer to [facility] for [facility].   Final Clinical Impressions(s) / ED Diagnoses   Final  diagnoses:  Other specified intestinal obstruction, unspecified whether partial or complete (HCC)  Hernia of small intestine     Zeppelin Beckstrand, Barbara Cower, MD 02/14/17 1138

## 2017-02-13 NOTE — Telephone Encounter (Signed)
Patient Name: Rebekah Campbell  DOB: 11-Mar-1966    Initial Comment Caller states that she had a hernia surgery. Over the course of the last week she started working out. The hernia site is now a little swollen. Yesterday she started vomiting yesterday. She is feeling dehydrated and was really straining to vomit. No vomiting today. Site is hurting, swollen, and pain is across her abdomen. Pain is severe.    Nurse Assessment  Nurse: Deatra James RN, Corrie Dandy Date/Time Lamount Cohen Time): 02/13/2017 2:28:16 PM  Confirm and document reason for call. If symptomatic, describe symptoms. ---Patient states she vomited X 6 yesterday and she is having pain where she had a hernia in the past at the top of her right leg. The pain is severe.  Does the patient have any new or worsening symptoms? ---Yes  Will a triage be completed? ---Yes  Related visit to physician within the last 2 weeks? ---No  Does the PT have any chronic conditions? (i.e. diabetes, asthma, etc.) ---No  Is the patient pregnant or possibly pregnant? (Ask all females between the ages of 62-55) ---No  Is this a behavioral health or substance abuse call? ---No     Guidelines    Guideline Title Affirmed Question Affirmed Notes  Hernia Severe abdominal pain    Final Disposition User   Go to ED Now Deatra James, RN, [facility]    Referrals  [facility] - ED   Disagree/Comply: Comply

## 2017-02-13 NOTE — ED Notes (Signed)
Requested CT team for imaging for pt's transfer; they have advised me that they have sent today's CT scan to [facility] via [facility]

## 2017-02-13 NOTE — Telephone Encounter (Signed)
Patient called to advise that her hernia surgery went well. Patient then stated that she was doing some sit ups this week and her stomach hurts. Yesterday, patient was very sick and nauseous. Patient states that today she got nauseous and has extreme pain in her thigh and abdomen. I transferred the call to [facility] and told the patient that the triage nurse will tell her what she will need to do. Office awaiting triage note.

## 2017-02-13 NOTE — ED Notes (Signed)
[facility] documentation verified with Meryle Ready RN.

## 2017-02-13 NOTE — Telephone Encounter (Signed)
Disposition was go to ED now. Patient checked into ED.

## 2017-02-14 MED ORDER — HYDROMORPHONE HCL 1 MG/ML IJ SOLN: 1.0000 mg | Freq: Once | INTRAMUSCULAR | Status: DC

## 2017-02-14 MED ORDER — ONDANSETRON HCL 4 MG/2ML IJ SOLN
4.0000 mg | Freq: Once | INTRAMUSCULAR | Status: AC
  Administered 2017-02-14: 4 mg via INTRAVENOUS
  Filled 2017-02-14: qty 2

## 2017-02-14 MED ORDER — FENTANYL CITRATE (PF) 100 MCG/2ML IJ SOLN
50.0000 ug | Freq: Once | INTRAMUSCULAR | Status: AC
  Administered 2017-02-14: 50 ug via INTRAVENOUS
  Filled 2017-02-14: qty 2

## 2017-02-14 NOTE — ED Notes (Signed)
Called [facility] to check on the status of our transport request, dispatch indicated it would be at least an hour and half before they are able to transport pt, called [facility] to check if they would be able to transport pt sooner, their dispatch indicated earliest they would be able to transport pt is 0500hrs or 0600hrs.

## 2017-02-14 NOTE — ED Notes (Signed)
Pt refused NG/OG.

## 2017-03-27 ENCOUNTER — Ambulatory Visit
Admission: RE | Admit: 2017-03-27 | Discharge: 2017-03-27 | Disposition: A | Discharge status: Home / Self Care | Payer: BLUE CROSS/BLUE SHIELD | Source: Ambulatory Visit | Attending: Family Medicine | Admitting: Family Medicine

## 2017-03-27 DIAGNOSIS — Z1231 Encounter for screening mammogram for malignant neoplasm of breast: Secondary | ICD-10-CM

## 2017-05-09 ENCOUNTER — Other Ambulatory Visit: Payer: Self-pay | Admitting: Family Medicine

## 2017-08-07 ENCOUNTER — Encounter: Payer: Self-pay | Admitting: Family Medicine

## 2017-08-07 ENCOUNTER — Ambulatory Visit: Payer: BLUE CROSS/BLUE SHIELD | Admitting: Family Medicine

## 2017-08-07 VITALS — BP 122/74 | HR 103 | Temp 98.6°F | Ht 64.0 in | Wt 126.6 lb

## 2017-08-07 DIAGNOSIS — R05 Cough: Secondary | ICD-10-CM

## 2017-08-07 DIAGNOSIS — R059 Cough, unspecified: Secondary | ICD-10-CM

## 2017-08-07 MED ORDER — OMEPRAZOLE 40 MG PO CPDR: 40.0000 mg | DELAYED_RELEASE_CAPSULE | Freq: Every day | ORAL | 0 refills | Status: DC

## 2017-08-07 MED ORDER — IPRATROPIUM BROMIDE 0.06 % NA SOLN
2.0000 | Freq: Four times a day (QID) | NASAL | 0 refills | Status: DC
Start: 2017-08-07 — End: 2017-11-18

## 2017-08-07 NOTE — Progress Notes (Signed)
   Subjective:  Rebekah Campbell is a 52 y.o. female who presents today for same-day appointment with a chief complaint of cough.   HPI:  Cough, New Problem Started 2 months ago. Initially had a URI which has since resolved.  Cough has been stable over the last several weeks.  No fevers or chills.  No shortness of breath.  Occasional pain on deep inspiration.  No chest pain.  Mildly decreased energy.  Still has mildly irritated throat and some sinus congestion.  Also some pain/pressure in her right ear.  Occasionally has reflux symptoms.  Symptoms are worse at night.  No treatments tried.  No other obvious alleviating or aggravating factors.  ROS: Per HPI  PMH: She reports that  has never smoked. she has never used smokeless tobacco. She reports that she drinks alcohol. She reports that she does not use drugs.  Objective:  Physical Exam: BP 122/74 (BP Location: Left Arm, Patient Position: Sitting, Cuff Size: Normal)   Pulse (!) 103   Temp 98.6 F (37 C) (Oral)   Ht 5\' 4"  (1.626 m)   Wt 126 lb 9.6 oz (57.4 kg)   SpO2 96%   BMI 21.73 kg/m   Gen: NAD, resting comfortably HEENT: Right TM with clear effusion.  Left TM clear.  Oropharynx slightly erythematous without exudate.  Maxillary sinuses clear to transillumination bilaterally. CV: RRR with no murmurs appreciated Pulm: NWOB, CTAB with no crackles, wheezes, or rhonchi  Assessment/Plan:  Chronic cough Lung exam clear without signs of pneumonia or bronchitis.  Symptoms likely secondary to postnasal drip.  She also has some reflux symptoms that may be contributing.  We will start Atrovent nasal spray to help with her postnasal drip.  Also recommended that we start PPI.  A prescription for omeprazole was sent into her pharmacy.  Reassured patient that she did not have any obvious infectious causes for her symptoms.  Advised patient to try the above for a few weeks to see if it improves her symptoms.  Discussed reasons to return to care.  If  symptoms worsen or not improving, would consider chest x-ray or other imaging as next step of evaluation.  [name], MD 08/07/2017 11:41 AM

## 2017-08-07 NOTE — Patient Instructions (Addendum)
You do not have pneumonia or bronchitis. Your cough is most likely coming from post-nasal drip and reflux.   Start the atrovent and omeprazole.   Give these medications a few weeks to work.  Let me know if your symptoms worsen or do not improve after a few weeks.  Take care, Dr Jimmey RalphParker

## 2017-08-14 ENCOUNTER — Other Ambulatory Visit: Payer: Self-pay | Admitting: Family Medicine

## 2017-08-26 NOTE — Progress Notes (Signed)
[name] D.O. [facility] [address] [place], [place] [address] Phone: [phone] Subjective:    I'm seeing this patient by the request  of:  Helane RimaWallace, Erica, DO   CC: Left shoulder pain  OZH:YQMVHQIONGHPI:Subjective  Rebekah Campbell is a 52 y.o. female coming in with complaint of left shoulder pain. Patient fell around Thanksgiving. FOOSH injury where she cracked 2 ribs and was told that she may have done something to her wrist. Her shoulder pain is in posterior aspect and the pain radiates down the tricep. Can cause tingling if she presses on tricep. Patient is able to swim daily and does not have pain with swimming.  When she does have pain seems to be out 5 out of 10.      Past Medical History:  Diagnosis Date  . ADHD (attention deficit hyperactivity disorder)   . Depression   . HTN (hypertension)   . Inguinal hernia of right side with obstruction and without gangrene    Past Surgical History:  Procedure Laterality Date  . NO PAST SURGERIES    . TONSILLECTOMY     Social History   Socioeconomic History  . Marital status: Single    Spouse name: None  . Number of children: None  . Years of education: None  . Highest education level: None  Social Needs  . Financial resource strain: None  . Food insecurity - worry: None  . Food insecurity - inability: None  . Transportation needs - medical: None  . Transportation needs - non-medical: None  Occupational History  . None  Tobacco Use  . Smoking status: Never Smoker  . Smokeless tobacco: Never Used  Substance and Sexual Activity  . Alcohol use: Yes    Alcohol/week: 0.0 oz    Types: 3 - 5 Glasses of wine per week  . Drug use: No  . Sexual activity: Yes  Other Topics Concern  . None  Social History Narrative  . None   Allergies  Allergen Reactions  . Codeine Nausea And Vomiting   Family History  Problem Relation Age of Onset  . Anesthesia problems Neg Hx      Past medical history, social, surgical  and family history all reviewed in electronic medical record.  No pertanent information unless stated regarding to the chief complaint.   Review of Systems:Review of systems updated and as accurate as of 08/28/17  No headache, visual changes, nausea, vomiting, diarrhea, constipation, dizziness, abdominal pain, skin rash, fevers, chills, night sweats, weight loss, swollen lymph nodes, body aches, joint swelling, muscle aches, chest pain, shortness of breath, mood changes.   Objective  Blood pressure 128/86, pulse 88, height 5\' 5"  (1.651 m), weight 129 lb (58.5 kg), SpO2 98 %. Systems examined below as of 08/28/17   General: No apparent distress alert and oriented x3 mood and affect normal, dressed appropriately.  HEENT: Pupils equal, extraocular movements intact  Respiratory: Patient's speak in full sentences and does not appear short of breath  Cardiovascular: No lower extremity edema, non tender, no erythema  Skin: Warm dry intact with no signs of infection or rash on extremities or on axial skeleton.  Abdomen: Soft nontender  Neuro: Cranial nerves II through XII are intact, neurovascularly intact in all extremities with 2+ DTRs and 2+ pulses.  Lymph: No lymphadenopathy of posterior or anterior cervical chain or axillae bilaterally.  Gait normal with good balance and coordination.  MSK:  Non tender with full range of motion and good stability and  symmetric strength and tone of  elbows, wrist, hip, knee and ankles bilaterally.  Shoulder: left Inspection reveals no abnormalities, atrophy or asymmetry. Palpation is normal with no tenderness over AC joint or bicipital groove. ROM is full in all planes passively. Rotator cuff strength normal throughout. signs of impingement with positive Neer and Hawkin's tests, but negative empty can sign. Speeds and Yergason's tests normal. No labral pathology noted with negative Obrien's, negative clunk and good stability. Normal scapular function  observed. No painful arc and no drop arm sign. No apprehension sign  MSK US performed of: left This study was ordered, performed, and interpreted by Terrilee Files D.O.  Shoulder:   Supraspinatus: Degenerative tear noted bursal bulge seen with shoulder abduction on impingement view. Infraspinatus:  Appears normal on long and transverse views. Significant increase in Doppler flow Subscapularis:  Appears normal on long and transverse views. Positive bursa AC joint: Mild arthritis Glenohumeral Joint: Mild arthritic changes.  Anteriorly possible healing compression fracture. Glenoid Labrum: Posterior tear noted Biceps Tendon: Hypoechoic changes  Impression: Partial rotator cuff tear with what appears to be possible latissimus versus rhomboid tear on the posterior aspect  16109; 15 additional minutes spent for Therapeutic exercises as stated in above notes.  This included exercises focusing on stretching, strengthening, with significant focus on eccentric aspects.   Long term goals include an improvement in range of motion, strength, endurance as well as avoiding reinjury. Patient's frequency would include in 1-2 times a day, 3-5 times a week for a duration of 6-12 weeks. Shoulder Exercises that included:  Basic scapular stabilization to include adduction and depression of scapula Scaption, focusing on proper movement and good control Internal and External rotation utilizing a theraband, with elbow tucked at side entire time Rows with theraband which was given   Proper technique shown and discussed handout in great detail with ATC.  All questions were discussed and answered.     Impression and Recommendations:     This case required medical decision making of moderate complexity.      Note: This dictation was prepared with Dragon dictation along with smaller phrase technology. Any transcriptional errors that result from this process are unintentional.

## 2017-08-28 ENCOUNTER — Encounter: Payer: Self-pay | Admitting: Family Medicine

## 2017-08-28 ENCOUNTER — Ambulatory Visit: Payer: Self-pay

## 2017-08-28 ENCOUNTER — Ambulatory Visit: Payer: BLUE CROSS/BLUE SHIELD | Admitting: Family Medicine

## 2017-08-28 ENCOUNTER — Ambulatory Visit (INDEPENDENT_AMBULATORY_CARE_PROVIDER_SITE_OTHER)
Admission: RE | Admit: 2017-08-28 | Discharge: 2017-08-28 | Disposition: A | Discharge status: Home / Self Care | Payer: BLUE CROSS/BLUE SHIELD | Source: Ambulatory Visit | Attending: Family Medicine | Admitting: Family Medicine

## 2017-08-28 VITALS — BP 128/86 | HR 88 | Ht 65.0 in | Wt 129.0 lb

## 2017-08-28 DIAGNOSIS — M24112 Other articular cartilage disorders, left shoulder: Secondary | ICD-10-CM

## 2017-08-28 DIAGNOSIS — M25512 Pain in left shoulder: Principal | ICD-10-CM

## 2017-08-28 DIAGNOSIS — G8929 Other chronic pain: Secondary | ICD-10-CM

## 2017-08-28 MED ORDER — VITAMIN D (ERGOCALCIFEROL) 1.25 MG (50000 UNIT) PO CAPS: 50000.0000 [IU] | ORAL_CAPSULE | ORAL | 0 refills | Status: DC

## 2017-08-28 NOTE — Assessment & Plan Note (Signed)
Degenerative tear noted.  Patient also has a labral pathology and hypoechoic changes of the bicep tendon.  I do believe that there was also a potential compression fracture.  Started on vitamin D, home exercise proper lifting mechanics.  Follow-up again in 4 weeks

## 2017-08-28 NOTE — Addendum Note (Signed)
Addended by: Ethlyn DanielsWOLF, Kanita Delage L on: 08/28/2017 10:41 AM   Modules accepted: Orders

## 2017-08-28 NOTE — Patient Instructions (Addendum)
Good to see you  Once weekly vitamin D for 12 weeks.  Ice 20 minutes 2 times daily. Usually after activity and before bed. pennsaid pinkie amount topically 2 times daily as needed.  Exercises 3 times a week.  Keep hands within peripheral ision  See me again in 3-4 weeks to make sure healing appropriately.

## 2017-09-03 ENCOUNTER — Other Ambulatory Visit: Payer: Self-pay | Admitting: Family Medicine

## 2017-09-16 NOTE — Progress Notes (Deleted)
[name] D.O. [facility] [address] [place], [place] [address] Phone: [phone] Subjective:    I'm seeing this patient by the request  of:    CC: Shoulder pain follow-up  OZH:YQMVHQIONGHPI:Subjective  Rebekah Campbell is a 52 y.o. female coming in with complaint of shoulder pain follow-up.  Found to have a degenerative rotator cuff tear as well as a rhomboid tear.  Left shoulder.  Patient given home exercises and icing regimen.  Started on vitamin D as well as proper lifting mechanics and home exercises.  Patient states   Patient did have left shoulder x-rays done at last exam.  Independently visualized by me showing no significant fracture or dislocation.  Past Medical History:  Diagnosis Date  . ADHD (attention deficit hyperactivity disorder)   . Depression   . HTN (hypertension)   . Inguinal hernia of right side with obstruction and without gangrene    Past Surgical History:  Procedure Laterality Date  . NO PAST SURGERIES    . TONSILLECTOMY     Social History   Socioeconomic History  . Marital status: Single    Spouse name: Not on file  . Number of children: Not on file  . Years of education: Not on file  . Highest education level: Not on file  Social Needs  . Financial resource strain: Not on file  . Food insecurity - worry: Not on file  . Food insecurity - inability: Not on file  . Transportation needs - medical: Not on file  . Transportation needs - non-medical: Not on file  Occupational History  . Not on file  Tobacco Use  . Smoking status: Never Smoker  . Smokeless tobacco: Never Used  Substance and Sexual Activity  . Alcohol use: Yes    Alcohol/week: 0.0 oz    Types: 3 - 5 Glasses of wine per week  . Drug use: No  . Sexual activity: Yes  Other Topics Concern  . Not on file  Social History Narrative  . Not on file   Allergies  Allergen Reactions  . Codeine Nausea And Vomiting   Family History  Problem Relation Age of Onset  . Anesthesia  problems Neg Hx      Past medical history, social, surgical and family history all reviewed in electronic medical record.  No pertanent information unless stated regarding to the chief complaint.   Review of Systems:Review of systems updated and as accurate as of 09/16/17  No headache, visual changes, nausea, vomiting, diarrhea, constipation, dizziness, abdominal pain, skin rash, fevers, chills, night sweats, weight loss, swollen lymph nodes, body aches, joint swelling, muscle aches, chest pain, shortness of breath, mood changes.   Objective  There were no vitals taken for this visit. Systems examined below as of 09/16/17   General: No apparent distress alert and oriented x3 mood and affect normal, dressed appropriately.  HEENT: Pupils equal, extraocular movements intact  Respiratory: Patient's speak in full sentences and does not appear short of breath  Cardiovascular: No lower extremity edema, non tender, no erythema  Skin: Warm dry intact with no signs of infection or rash on extremities or on axial skeleton.  Abdomen: Soft nontender  Neuro: Cranial nerves II through XII are intact, neurovascularly intact in all extremities with 2+ DTRs and 2+ pulses.  Lymph: No lymphadenopathy of posterior or anterior cervical chain or axillae bilaterally.  Gait normal with good balance and coordination.  MSK:  Non tender with full range of motion and good stability  and symmetric strength and tone of shoulders, elbows, wrist, hip, knee and ankles bilaterally.     Impression and Recommendations:     This case required medical decision making of moderate complexity.      Note: This dictation was prepared with Dragon dictation along with smaller phrase technology. Any transcriptional errors that result from this process are unintentional.

## 2017-09-17 ENCOUNTER — Ambulatory Visit: Payer: BLUE CROSS/BLUE SHIELD | Admitting: Family Medicine

## 2017-09-17 DIAGNOSIS — Z0289 Encounter for other administrative examinations: Secondary | ICD-10-CM

## 2017-09-30 ENCOUNTER — Ambulatory Visit: Payer: BLUE CROSS/BLUE SHIELD | Admitting: Sports Medicine

## 2017-09-30 ENCOUNTER — Ambulatory Visit (INDEPENDENT_AMBULATORY_CARE_PROVIDER_SITE_OTHER): Payer: BLUE CROSS/BLUE SHIELD

## 2017-09-30 ENCOUNTER — Encounter: Payer: Self-pay | Admitting: Sports Medicine

## 2017-09-30 VITALS — BP 110/76 | HR 96 | Ht 65.0 in | Wt 124.2 lb

## 2017-09-30 DIAGNOSIS — M25572 Pain in left ankle and joints of left foot: Secondary | ICD-10-CM | POA: Diagnosis not present

## 2017-09-30 DIAGNOSIS — S82892A Other fracture of left lower leg, initial encounter for closed fracture: Secondary | ICD-10-CM

## 2017-09-30 NOTE — Procedures (Signed)
[facility] at [facility] Interpreted by myself [name](Parth Mccormac D Valetta Mulroy, DO) during office visit.  Results were reviewed with the patient at the time of the visit.   3 VIEW X-RAY of: Left ankle  FINDINGS:  Small avulsion of the distal fibula.  Flonnie HailstoneSh2e has otherwise intact mortise.  The distal fibular avulsion fracture does appear to have acute and likely a chronic component to this.  She is minimal degenerative changes of the ankle joint otherwise. IMPRESSION:  Small avulsion fracture of the distal fibula with acute and chronic components.

## 2017-09-30 NOTE — Progress Notes (Signed)
Rebekah Campbell. [facility] at [facility] [phone]  Rebekah Campbell - 52 y.o. female MRN [id number]  Date of birth: [birth year]  Visit Date: 09/30/2017  PCP: Helane Rima, DO   Referred by: Helane Rima, DO  Scribe for today's visit: Christoper Fabian, LAT, ATC     SUBJECTIVE:  Rebekah Campbell is here for New Patient (Initial Visit) (L ankle pain) .   Her L ankle pain symptoms INITIALLY: Began yesterday when she rolled her ankle when she slipped and fell while walking her dog.  She had significant L lateral malleoloar swelling yesterday which has decreased as of today.  She now has swelling in her L lower leg which she feels is associated w/ applying a compression wrap last night which pushed the swelling proximally. Described as moderate, sharp pain at the L lateral malleolus and 1/10 pain just post to the L medial malleolus, nonradiating.  Swelling noted at the L ankle and moving proximally into the L lower leg Worsened with weight bearing and applying pressure to the L lateral malleolus Improved with ice and elevation Additional associated symptoms include: no N/T    At this time symptoms are improving compared to onset w/ decreased pain and swelling noted today. She has taken 2 Aleve last night and again this morning.  She has ben applying ice and elevating her L foot/ankle.  ROS Denies night time disturbances. Denies fevers, chills, or night sweats. Denies unexplained weight loss. Denies personal history of cancer. Denies changes in bowel or bladder habits. Reports recent unreported falls. Denies new or worsening dyspnea or wheezing. Denies headaches or dizziness.  Denies numbness, tingling or weakness  In the extremities.  Denies dizziness or presyncopal episodes Reports lower extremity edema  - yes to L ankle swelling   HISTORY & PERTINENT PRIOR DATA:  Prior History reviewed and updated per electronic medical record.    Significant/pertinent history, findings, studies include:  reports that she has never smoked. She has never used smokeless tobacco. No results for input(s): HGBA1C, LABURIC, CREATINE in the last 8760 hours. No specialty comments available. Problem  Avulsion Fracture of Ankle, Left, Closed, Initial Encounter    OBJECTIVE:  VS:  HT:5\' 5"  (165.1 cm)   WT:124 lb 3.2 oz (56.3 kg)  BMI:20.67    BP:110/76  HR:96bpm  TEMP: ( )  RESP:93 %   PHYSICAL EXAM: Constitutional: WDWN, Non-toxic appearing. Psychiatric: Alert & appropriately interactive.  Not depressed or anxious appearing. Respiratory: No increased work of breathing.  Trachea Midline Eyes: Pupils are equal.  EOM intact without nystagmus.  No scleral icterus  Vascular Exam: Lower tremors are warm to touch DP PT pulses 2+/4. Generalized ankle swelling but no significant lower extremity edema.   lower extremity neuro exam: unremarkable  MSK Exam: Ankle exam is a moderate degree of swelling along the anterior lateral ankle.  She has focal tenderness over the distal fibula.  Ankle drawer testing is painful but stable.  She does have pain with dorsiflexion and plantar flexion both actively and passively.   ASSESSMENT & PLAN:   1. Acute left ankle pain   2. Avulsion fracture of ankle, left, closed, initial encounter     PLAN: Ultimately had a long discussion today she is been walking around on this and is having a moderate degree of pain would like to avoid boot immobilization I discussed that the risks of worsening this with recurrent injury are elevated without proper mobilization but given  her activity level ASO was provided.  The injury should heal well as long she does not have any significant repeat trauma but do recommend that she avoid activities over the next several weeks.  She is following up with Dr. Katrinka BlazingSmith later next week and I will defer to his management going forward.  Follow-up: Return in about 2 weeks (around  10/14/2017).      Please see additional documentation for Objective, Assessment and Plan sections. Pertinent additional documentation may be included in corresponding procedure notes, imaging studies, problem based documentation and patient instructions. Please see these sections of the encounter for additional information regarding this visit.  CMA/ATC served as Neurosurgeonscribe during this visit. History, Physical, and Plan performed by medical provider. Documentation and orders reviewed and attested to.      Andrena MewsMichael D Rigby, DO    [facility]

## 2017-09-30 NOTE — Patient Instructions (Signed)
Full diagnosis: L lateral malleolus avulsion fracture

## 2017-10-02 ENCOUNTER — Telehealth: Payer: Self-pay

## 2017-10-02 ENCOUNTER — Telehealth: Payer: Self-pay | Admitting: Family Medicine

## 2017-10-02 ENCOUNTER — Ambulatory Visit: Payer: BLUE CROSS/BLUE SHIELD | Admitting: Family Medicine

## 2017-10-02 ENCOUNTER — Ambulatory Visit: Payer: Self-pay

## 2017-10-02 VITALS — BP 116/76 | HR 103 | Ht 65.0 in | Wt 124.0 lb

## 2017-10-02 DIAGNOSIS — S6982XA Other specified injuries of left wrist, hand and finger(s), initial encounter: Secondary | ICD-10-CM | POA: Diagnosis not present

## 2017-10-02 DIAGNOSIS — M24112 Other articular cartilage disorders, left shoulder: Secondary | ICD-10-CM | POA: Diagnosis not present

## 2017-10-02 DIAGNOSIS — S82899A Other fracture of unspecified lower leg, initial encounter for closed fracture: Secondary | ICD-10-CM

## 2017-10-02 DIAGNOSIS — M25532 Pain in left wrist: Secondary | ICD-10-CM

## 2017-10-02 DIAGNOSIS — S82892A Other fracture of left lower leg, initial encounter for closed fracture: Secondary | ICD-10-CM | POA: Insufficient documentation

## 2017-10-02 MED ORDER — VITAMIN D (ERGOCALCIFEROL) 1.25 MG (50000 UNIT) PO CAPS: 50000.0000 [IU] | ORAL_CAPSULE | ORAL | 0 refills | Status: DC

## 2017-10-02 NOTE — Telephone Encounter (Signed)
Returned pt's call and gave her the phone number for [employer] [phone]) to initiate the return process for her ankle brace.

## 2017-10-02 NOTE — Telephone Encounter (Signed)
Copied from CRM [id number]. Topic: General - Other >> Oct 02, 2017 12:15 PM Debroah LoopLander, Lumin L wrote: Reason for CRM: Patient would like a refund on the braces Dr. Berline Choughigby prescribed.  Please call her back to let her know the process and provide her with the information she needs to complete this process.

## 2017-10-02 NOTE — Progress Notes (Signed)
[name] D.O. [facility] [address] [place], [place] [address] Phone: [phone] Subjective:     CC: Left shoulder pain follow-up  BJY:NWGNFAOZHYHPI:Subjective  Rebekah Campbell is a 52 y.o. female coming in with complaint of left shoulder pain.  Patient was found to have a degenerative tear of likely of the labrum as well as potentially rotator cuff.  Patient also had a rhomboid tear noted on the back.  Patient wants to try conservative therapy including home exercises and icing regimen.  Patient states that she continued pain. She is unsure if she is improving.  Also found to have an ulnar styloid fracture of the wrist. X-rays of patient's shoulder were taken August 28, 2017 were independently visualized by me showing no significant bony abnormality. Patient states that she may have hurt her wrist again with the fall that she took the other day with her dog. Pain is over the distal radius.  Patient states that it seems to be a little more dorsal.  Patient is also recently had a left ankle fracture.  Saw another provider but note is not done.  Patient is confused on what the diagnosis is except for he wrote down lateral fracture of the ankle.  Patient was given a lace up brace that she is wearing but feels like it is worsening.  Has been noticing she may be having worsening time with ambulation.   Patient did have an x-ray done at an outside facility.  Read is not done yet.  Independently visualized by me showing the patient does have an avulsion fracture and actually to 1 posteriorly and displaced.  This is of the lateral distal fibula.  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  We will ask provider to send for overread  Past Medical History:  Diagnosis Date  . ADHD (attention deficit hyperactivity disorder)   . Depression   . HTN (hypertension)   . Inguinal hernia of right side with obstruction and without gangrene    Past Surgical History:  Procedure Laterality Date    . NO PAST SURGERIES    . TONSILLECTOMY     Social History   Socioeconomic History  . Marital status: Single    Spouse name: Not on file  . Number of children: Not on file  . Years of education: Not on file  . Highest education level: Not on file  Occupational History  . Not on file  Social Needs  . Financial resource strain: Not on file  . Food insecurity:    Worry: Not on file    Inability: Not on file  . Transportation needs:    Medical: Not on file    Non-medical: Not on file  Tobacco Use  . Smoking status: Never Smoker  . Smokeless tobacco: Never Used  Substance and Sexual Activity  . Alcohol use: Yes    Alcohol/week: 0.0 oz    Types: 3 - 5 Glasses of wine per week  . Drug use: No  . Sexual activity: Yes  Lifestyle  . Physical activity:    Days per week: Not on file    Minutes per session: Not on file  . Stress: Not on file  Relationships  . Social connections:    Talks on phone: Not on file    Gets together: Not on file    Attends religious service: Not on file    Active member of club or organization: Not on file    Attends  meetings of clubs or organizations: Not on file    Relationship status: Not on file  Other Topics Concern  . Not on file  Social History Narrative  . Not on file   Allergies  Allergen Reactions  . Codeine Nausea And Vomiting   Family History  Problem Relation Age of Onset  . Anesthesia problems Neg Hx      Past medical history, social, surgical and family history all reviewed in electronic medical record.  No pertanent information unless stated regarding to the chief complaint.   Review of Systems:Review of systems updated and as accurate as of 10/02/17  No headache, visual changes, nausea, vomiting, diarrhea, constipation, dizziness, abdominal pain, skin rash, fevers, chills, night sweats, weight loss, swollen lymph nodes, body aches, joint swelling, , chest pain, shortness of breath, mood changes.  Positive muscle  aches  Objective  Blood pressure 116/76, pulse (!) 103, height 5\' 5"  (1.651 m), weight 124 lb (56.2 kg), SpO2 98 %. Systems examined below as of 10/02/17   General: No apparent distress alert and oriented x3 mood and affect normal, dressed appropriately.  HEENT: Pupils equal, extraocular movements intact  Respiratory: Patient's speak in full sentences and does not appear short of breath  Cardiovascular: No lower extremity edema, non tender, no erythema  Skin: Warm dry intact with no signs of infection or rash on extremities or on axial skeleton.  Abdomen: Soft nontender  Neuro: Cranial nerves II through XII are intact, neurovascularly intact in all extremities with 2+ DTRs and 2+ pulses.  Lymph: No lymphadenopathy of posterior or anterior cervical chain or axillae bilaterally.  Gait antalgic gait.  MSK:  Non tender with full range of motion and good stability and symmetric strength and tone of elbows,  hip, knee bilaterally.  Patient shoulder has improved range of motion.  No swelling over the rhomboid anymore.  Less tender than palpation.  Near full range of motion with very mild positive O'Brien still remaining.  Left wrist pain is severely more tender to palpation over the dorsal aspect of the wrist.  Has some mild limitation in extension and 5 degrees.  Grip strength is 4 out of 5 compared to the contralateral side.  Left ankle shows significant amount of swelling.  Discoloration and bruising noted.  Distal pulses palpated.  Patient is severely tender over the fibula distally.  Mild crepitus noted.  Negative Thompson test.  Limited range of motion secondary to pain  MSK US performed of: Left ankle This study was ordered, performed, and interpreted by Terrilee Files D.O.  Foot/Ankle:   Limited ultrasound of the lateral aspect of the ankle shows the patient does have a full rupture of the ATFL.  No condition of this avulsion noted of the distal fibula.  Patient unfortunately does have 3  different fractures of the fibula noted one posteriorly now bilaterally displaced. Power doppler signal normal.  IMPRESSION: Lateral avulsion ankle fracture that is different from x-ray showing no displacement   Limited musculoskeletal ultrasound was performed and interpreted by Judi Saa  Limited ultrasound of patient's left wrist shows a distal radius fracture seems to be healing with good callus formation and increasing Doppler flow.  Patient does have a new injury of the TFCC with some mild displacement noted, hypoechoic changes and effusion noted.    Impression and Recommendations:     This case required medical decision making of moderate complexity.      Note: This dictation was prepared with Dragon dictation along with smaller  Company secretary. Any transcriptional errors that result from this process are unintentional.

## 2017-10-02 NOTE — Assessment & Plan Note (Signed)
Patient actually has more of a compound fracture noted.  Concerned that this could be some instability.  Patient was able to walk significantly better though in a Aircast.  We attempted a Cam walker but had difficulty.  We discussed with patient that if we continue to have falling we may need to consider further evaluation.  Declined formal physical therapy for balance and coordination.  Follow-up again in 2 weeks for further evaluation.

## 2017-10-02 NOTE — Assessment & Plan Note (Signed)
Injury noted.  Wrist brace given today.  Discussed icing and topical anti-inflammatories.  Worsening symptoms consider injection.  Patient would not want any surgical intervention.  We will monitor

## 2017-10-02 NOTE — Assessment & Plan Note (Signed)
Stable.  Seem to be improving.  No significant change in management.  Patient will continue to monitor.

## 2017-10-02 NOTE — Telephone Encounter (Signed)
Copied from CRM [id number]. Topic: Quick Communication - Patient Running Late >> Oct 02, 2017 10:55 AM Rudi CocoLathan, Tino Ronan M, NT wrote: Patient called and is running 10 minutes late. Let pt. Know about late policy. (pt. Had to run back home to turn house alarm off   Route to department's [facility] pool.

## 2017-10-02 NOTE — Patient Instructions (Signed)
Good to see you  The ankle does have many little fracture but will heal with the right brace! Wear the brace daily  Once weekly vitamin D again  pennsaid pinkie amount topically 2 times daily as needed.   Wrist brace for the TFCC injury that should do well Wear it day and night for 1 week then nightly for 2 weeks  Rhomboid strain is healing great and should do well  Continue wit the exercises for the shoulder See me again in 3 weeks

## 2017-10-12 ENCOUNTER — Telehealth: Payer: Self-pay

## 2017-10-12 NOTE — Telephone Encounter (Signed)
Spoke with patient who is having pain around her ankle. She said that the brace seems to be pressing on the ankle. Explained to patient how to widen the aircast brace. She would like to try widening it but if this does not work Dr. Katrinka BlazingSmith provided a verbal to get a patient into a short cam walker. Patient will call back if she wants cam walker.

## 2017-10-26 NOTE — Progress Notes (Addendum)
[name] [address] [place], [place] [address] Phone: [phone] Subjective:     CC: Ankle pain follow-up, left wrist pain follow-up  BJY:NWGNFAOZHY  Rebekah Campbell is a 52 y.o. female coming in with complaint of  Left ankle pain.  Patient was seen previously and had a lateral malleolus fracture.  Did have some minimal displacement.  Was to be wearing the Aircast.  States that it is uncomfortable.  Patient feels well but she is able to bear weight better.  Not as much swelling.  States that the bruising is resolving as well.  Patient also was found to have TFCC tear.  Feels like it is doing better.  Pain more localized over the [facility] joint on the left side.  States he can grip strength seems to be more difficult.  Left shoulder pain as well.  Had a rhomboid strain and tear.  Patient states that it still is uncomfortable.  No worsening pain.  Noticing increasing range of motion though.  Still pain at night    Past Medical History:  Diagnosis Date  . ADHD (attention deficit hyperactivity disorder)   . Depression   . HTN (hypertension)   . Inguinal hernia of right side with obstruction and without gangrene    Past Surgical History:  Procedure Laterality Date  . NO PAST SURGERIES    . TONSILLECTOMY     Social History   Socioeconomic History  . Marital status: Single    Spouse name: Not on file  . Number of children: Not on file  . Years of education: Not on file  . Highest education level: Not on file  Occupational History  . Not on file  Social Needs  . Financial resource strain: Not on file  . Food insecurity:    Worry: Not on file    Inability: Not on file  . Transportation needs:    Medical: Not on file    Non-medical: Not on file  Tobacco Use  . Smoking status: Never Smoker  . Smokeless tobacco: Never Used  Substance and Sexual Activity  . Alcohol use: Yes    Alcohol/week: 0.0 oz    Types: 3 - 5 Glasses of wine per week  .  Drug use: No  . Sexual activity: Yes  Lifestyle  . Physical activity:    Days per week: Not on file    Minutes per session: Not on file  . Stress: Not on file  Relationships  . Social connections:    Talks on phone: Not on file    Gets together: Not on file    Attends religious service: Not on file    Active member of club or organization: Not on file    Attends meetings of clubs or organizations: Not on file    Relationship status: Not on file  Other Topics Concern  . Not on file  Social History Narrative  . Not on file   Allergies  Allergen Reactions  . Codeine Nausea And Vomiting   Family History  Problem Relation Age of Onset  . Anesthesia problems Neg Hx      Past medical history, social, surgical and family history all reviewed in electronic medical record.  No pertanent information unless stated regarding to the chief complaint.   Review of Systems:Review of systems updated and as accurate as of 10/27/17  No headache, visual changes, nausea, vomiting, diarrhea, constipation, dizziness, abdominal pain, skin rash, fevers, chills, night sweats, weight loss, swollen  lymph nodes, body aches, joint swelling,  chest pain, shortness of breath, mood changes.  Positive muscle aches  Objective  Blood pressure 100/70, pulse 90, height  (1.651 m), weight 122 lb (55.3 kg), SpO2 99 %. Systems examined below as of 10/27/17   General: No apparent distress alert and oriented x3 mood and affect normal, dressed appropriately.  HEENT: Pupils equal, extraocular movements intact  Respiratory: Patient's speak in full sentences and does not appear short of breath  Cardiovascular: No lower extremity edema, non tender, no erythema  Skin: Warm dry intact with no signs of infection or rash on extremities or on axial skeleton.  Abdomen: Soft nontender  Neuro: Cranial nerves II through XII are intact, neurovascularly intact in all extremities with 2+ DTRs and 2+ pulses.  Lymph: No  lymphadenopathy of posterior or anterior cervical chain or axillae bilaterally.   Gait antalgic gait MSK:  Non tender with full range of motion and good stability and symmetric strength and tone of shoulders, elbows,  hip, knee bilaterally.   Left ankle does show significant decrease inflammation and bruising from previous exam.  Nearly full range of motion.  Still pain over the lateral malleolus.  No crepitus noted.  Neurovascularly intact distally.  4+ out of 5 strength  Left wrist she has a positive grind test of the [facility].  Patient has improvement in range of motion of the wrist.  Still some pain with straining of the TFCC.   Limited musculoskeletal ultrasound was performed and interpreted.Judi Saa  Limited ultrasound of the lateral aspect of the ankle shows the patient does have some bony callus noted.  Hypoechoic changes still noted.  No significant displacement. Impression: Interval healing  Procedure: Real-time Ultrasound Guided Injection of left CMC joint Device: GE Logiq Q7 Ultrasound guided injection is preferred based studies that show increased duration, increased effect, greater accuracy, decreased procedural pain, increased response rate, and decreased cost with ultrasound guided versus blind injection.  Verbal informed consent obtained.  Time-out conducted.  Noted no overlying erythema, induration, or other signs of local infection.  Skin prepped in a sterile fashion.  Local anesthesia: Topical Ethyl chloride.  With sterile technique and under real time ultrasound guidance: 25-gauge 1/2 inch needle was injected with 0.5 cc of 0.5% Marcaine and 0.5 cc of Kenalog 40 mg/mL Completed without difficulty  Pain immediately resolved suggesting accurate placement of the medication.  Advised to call if fevers/chills, erythema, induration, drainage, or persistent bleeding.  Images permanently stored and available for review in the ultrasound unit.  Impression: Technically successful  ultrasound guided injection.      Impression and Recommendations:     This case required medical decision making of moderate complexity.      Note: This dictation was prepared with Dragon dictation along with smaller phrase technology. Any transcriptional errors that result from this process are unintentional.

## 2017-10-27 ENCOUNTER — Ambulatory Visit: Payer: BLUE CROSS/BLUE SHIELD | Admitting: Family Medicine

## 2017-10-27 ENCOUNTER — Ambulatory Visit: Payer: Self-pay

## 2017-10-27 ENCOUNTER — Encounter: Payer: Self-pay | Admitting: Family Medicine

## 2017-10-27 VITALS — BP 100/70 | HR 90 | Ht 65.0 in | Wt 122.0 lb

## 2017-10-27 DIAGNOSIS — M25572 Pain in left ankle and joints of left foot: Secondary | ICD-10-CM | POA: Diagnosis not present

## 2017-10-27 DIAGNOSIS — S82899A Other fracture of unspecified lower leg, initial encounter for closed fracture: Secondary | ICD-10-CM | POA: Diagnosis not present

## 2017-10-27 DIAGNOSIS — M24112 Other articular cartilage disorders, left shoulder: Secondary | ICD-10-CM | POA: Diagnosis not present

## 2017-10-27 DIAGNOSIS — G8929 Other chronic pain: Secondary | ICD-10-CM

## 2017-10-27 DIAGNOSIS — M1812 Unilateral primary osteoarthritis of first carpometacarpal joint, left hand: Secondary | ICD-10-CM | POA: Diagnosis not present

## 2017-10-27 NOTE — Patient Instructions (Signed)
Good to see you  Try the boot for another 2 weeks at least and maybe 4 weeks Ok to add biking in 2 weeks Injected the left thumb today  OK in 1-2 weeks stop the brace on the wrist and increase activity as tolerated.  The shoulder I would give more time otherwise will need mri  See me again in 4 weeks

## 2017-10-27 NOTE — Assessment & Plan Note (Signed)
Improvement noted.  Discussed icing, HEP does not like the air cast  Discussed boot.  rtc in 4 weeks

## 2017-10-27 NOTE — Assessment & Plan Note (Signed)
Declined MRI  Will have patient continue HEP  RTC in 4 weeks

## 2017-10-27 NOTE — Assessment & Plan Note (Signed)
Injected today.  Discussed icing regimen.  Discussed bracing.  Continue to monitor.  Follow-up in 4 weeks

## 2017-11-13 ENCOUNTER — Telehealth: Payer: Self-pay

## 2017-11-13 NOTE — Telephone Encounter (Signed)
Called patient about bracing questions. Left message for patient to call back.

## 2017-11-16 NOTE — Telephone Encounter (Signed)
Brace information:  ASO was given on 09-30-17 with return by date 10/14/17 Aircast was given on 10-05-17 with a return by date 10/19/17

## 2017-11-16 NOTE — Telephone Encounter (Signed)
Patient called voicemail. Returned her call and left her a Engineer, technical sales.

## 2017-11-17 ENCOUNTER — Telehealth: Payer: Self-pay | Admitting: Family Medicine

## 2017-11-17 NOTE — Telephone Encounter (Signed)
Copied from CRM [id number]. Topic: Quick Communication - See Telephone Encounter >> Nov 17, 2017  8:06 AM Eston Mould B wrote: CRM for notification. See Telephone encounter for: 11/17/17. PT returned call to Vikki Ports   And is asking for call back.  She states she has a cyst on left foot  [phone]

## 2017-11-17 NOTE — Telephone Encounter (Signed)
Spoke with patient who is in contact with [facility] about her brace return. She has an appointment next week which I reminded her of and she would like to have a cyst evaluated that has formed on the 2nd toe since her ankle injury.

## 2017-11-18 ENCOUNTER — Ambulatory Visit: Payer: BLUE CROSS/BLUE SHIELD | Admitting: Family Medicine

## 2017-11-18 ENCOUNTER — Telehealth: Payer: Self-pay | Admitting: Family Medicine

## 2017-11-18 ENCOUNTER — Other Ambulatory Visit: Payer: Self-pay

## 2017-11-18 ENCOUNTER — Encounter: Payer: Self-pay | Admitting: Family Medicine

## 2017-11-18 VITALS — BP 118/80 | HR 122 | Temp 98.5°F | Resp 20 | Ht 64.0 in | Wt 116.0 lb

## 2017-11-18 DIAGNOSIS — N3000 Acute cystitis without hematuria: Secondary | ICD-10-CM | POA: Diagnosis not present

## 2017-11-18 DIAGNOSIS — R350 Frequency of micturition: Secondary | ICD-10-CM | POA: Diagnosis not present

## 2017-11-18 LAB — POCT URINALYSIS DIPSTICK
Bilirubin, UA: NEGATIVE
Blood, UA: NEGATIVE
Glucose, UA: NEGATIVE
Ketones, UA: NEGATIVE
Nitrite, UA: POSITIVE
Protein, UA: NEGATIVE
Spec Grav, UA: 1.02 (ref 1.010–1.025)
Urobilinogen, UA: 0.2 E.U./dL
pH, UA: 6 (ref 5.0–8.0)

## 2017-11-18 MED ORDER — CEPHALEXIN 500 MG PO CAPS: 500.0000 mg | ORAL_CAPSULE | Freq: Two times a day (BID) | ORAL | 0 refills | Status: AC

## 2017-11-18 MED ORDER — DICLOFENAC SODIUM 2 % TD SOLN: 2.0000 g | Freq: Two times a day (BID) | TRANSDERMAL | 3 refills | Status: DC

## 2017-11-18 NOTE — Telephone Encounter (Signed)
Copied from CRM [id number]. Topic: Quick Communication - See Telephone Encounter >> Nov 18, 2017  4:58 PM Lorrine Kin, NT wrote: CRM for notification. See Telephone encounter for: 11/18/17. Patient calling and states that the prescription that was sent to [facility] was not filled because that pharmacy does not deal with [place] thru [place]. States that she would like the Diclofenac Sodium 2 % SOLN sent to the [id number] - [place], [place] - [address]. Please advise.

## 2017-11-18 NOTE — Patient Instructions (Signed)
It was nice to see you!  You have a urinary tract infection. Please start the antibiotic.  We will check a urine culture to make sure you do not have a resistant bacteria. We will call you if we need to change your medications.   Please make sure you are drinking plenty of fluids over the next few days.  If your symptoms do not improve over the next 5-7 days, or if they worsen, please let us know. Please also let us know if you have worsening back pain, fevers, chills, or body aches.   Take care, Dr Gwendlyn Hanback  

## 2017-11-18 NOTE — Progress Notes (Signed)
    Subjective:  Rebekah Campbell is a 52 y.o. female who presents today for same-day appointment with a chief complaint of Dysuria.   HPI:  Dysuria, acute problem Started about a week ago. Getting worse. Associated with frequency, malodor, and lower abdominal pain. No treatments tried. No fevers or chills. Mild low back pain.  No obvious alleviating or aggravating factors.  ROS: Per HPI  PMH: She reports that she has never smoked. She has never used smokeless tobacco. She reports that she drinks alcohol. She reports that she does not use drugs.  Objective:  Physical Exam: BP 118/80   Pulse (!) 122   Temp 98.5 F (36.9 C) (Oral)   Resp 20   Ht  (1.626 m)   Wt 116 lb (52.6 kg)   SpO2 98%   BMI 19.91 kg/m   Gen: NAD, resting comfortably CV: RRR with no murmurs appreciated Pulm: NWOB, CTAB with no crackles, wheezes, or rhonchi GI: Normal bowel sounds present. Soft, Nontender, Nondistended. MSK: No edema, cyanosis, or clubbing noted.  No CVA tenderness Skin: Warm, dry Neuro: Grossly normal, moves all extremities Psych: Normal affect and thought content  Results for orders placed or performed in visit on 11/18/17 (from the past 24 hour(s))  POCT urinalysis dipstick     Status: Abnormal   Collection Time: 11/18/17 11:22 AM  Result Value Ref Range   Color, UA Yellow    Clarity, UA Small    Glucose, UA Negative Negative   Bilirubin, UA Negative    Ketones, UA Negative    Spec Grav, UA 1.020 1.010 - 1.025   Blood, UA Negative    pH, UA 6.0 5.0 - 8.0   Protein, UA Negative Negative   Urobilinogen, UA 0.2 0.2 or 1.0 E.U./dL   Nitrite, UA Positive    Leukocytes, UA Small (1+) (A) Negative   Appearance     Odor     Assessment/Plan:  UTI UA and symptoms consistent with UTI. No signs of systemic illness. Start course of keflex  bid for 1 week.  Check urine culture.  Encouraged good oral hydration.  Return precautions reviewed. Follow up as needed.   Katina Degree.  Jimmey Ralph, MD 11/18/2017 11:23 AM

## 2017-11-19 NOTE — Telephone Encounter (Signed)
She would like for Vikki Ports to put them to the side. Call when ready for pick up. Thanks

## 2017-11-19 NOTE — Telephone Encounter (Signed)
Spoke with patient and left samples up front for her.

## 2017-11-19 NOTE — Telephone Encounter (Signed)
Called patient to let her know that her insurance does not cover Pennsaid and that she can pick up some samples if she would like but needs to call and let me know so I can set some aside for her.

## 2017-11-21 LAB — URINE CULTURE
MICRO NUMBER: 90622495
SPECIMEN QUALITY: ADEQUATE

## 2017-11-24 ENCOUNTER — Telehealth: Payer: Self-pay | Admitting: Radiology

## 2017-11-24 ENCOUNTER — Other Ambulatory Visit: Payer: Self-pay | Admitting: Family Medicine

## 2017-11-24 NOTE — Progress Notes (Signed)
[name] [address] [place], [place] [address] Phone: [phone] Subjective:     CC: ankle and wrist pain   BJY:[id number]  Rebekah Campbell is a 52 y.o. female coming in with complaint of left wrist thumb pain. She did feel a pop the other day and now she does not have pain.   Patient has been feeling better and does note that she wears the brace for support. She also notes that she has a cyst on the left foot second. Believes range of motion is improving. Has not been as active as she usually would be but has tried swimming without problems.     Past Medical History:  Diagnosis Date  . ADHD (attention deficit hyperactivity disorder)   . Depression   . HTN (hypertension)   . Inguinal hernia of right side with obstruction and without gangrene    Past Surgical History:  Procedure Laterality Date  . NO PAST SURGERIES    . TONSILLECTOMY     Social History   Socioeconomic History  . Marital status: Single    Spouse name: Not on file  . Number of children: Not on file  . Years of education: Not on file  . Highest education level: Not on file  Occupational History  . Not on file  Social Needs  . Financial resource strain: Not on file  . Food insecurity:    Worry: Not on file    Inability: Not on file  . Transportation needs:    Medical: Not on file    Non-medical: Not on file  Tobacco Use  . Smoking status: Never Smoker  . Smokeless tobacco: Never Used  Substance and Sexual Activity  . Alcohol use: Yes    Alcohol/week: 0.0 oz    Types: 3 - 5 Glasses of wine per week  . Drug use: No  . Sexual activity: Yes  Lifestyle  . Physical activity:    Days per week: Not on file    Minutes per session: Not on file  . Stress: Not on file  Relationships  . Social connections:    Talks on phone: Not on file    Gets together: Not on file    Attends religious service: Not on file    Active member of club or organization: Not on file   Attends meetings of clubs or organizations: Not on file    Relationship status: Not on file  Other Topics Concern  . Not on file  Social History Narrative  . Not on file   Allergies  Allergen Reactions  . Codeine Nausea And Vomiting   Family History  Problem Relation Age of Onset  . Anesthesia problems Neg Hx      Past medical history, social, surgical and family history all reviewed in electronic medical record.  No pertanent information unless stated regarding to the chief complaint.   Review of Systems:Review of systems updated and as accurate as of 11/26/17  No headache, visual changes, nausea, vomiting, diarrhea, constipation, dizziness, abdominal pain, skin rash, fevers, chills, night sweats, weight loss, swollen lymph nodes, body aches, joint swelling, muscle aches, chest pain, shortness of breath, mood changes.   Objective  Blood pressure 122/86, pulse 90, height  (1.626 m), weight 116 lb (52.6 kg), SpO2 97 %. Systems examined below as of 11/26/17   General: No apparent distress alert and oriented x3 mood and affect normal, dressed appropriately.  HEENT: Pupils equal, extraocular movements intact  Respiratory:  Patient's speak in full sentences and does not appear short of breath  Cardiovascular: No lower extremity edema, non tender, no erythema  Skin: Warm dry intact with no signs of infection or rash on extremities or on axial skeleton.  Abdomen: Soft nontender  Neuro: Cranial nerves II through XII are intact, neurovascularly intact in all extremities with 2+ DTRs and 2+ pulses.  Lymph: No lymphadenopathy of posterior or anterior cervical chain or axillae bilaterally.  Gait normal with good balance and coordination.  MSK:  Non tender with full range of motion and good stability and symmetric strength and tone of shoulders, elbows,  hip, knee bilaterally.  Mild arthritic changes Ankle: Left No visible erythema or swelling. Range of motion is full in all  directions. Strength is 5/5 in all directions. Stable lateral and medial ligaments; squeeze test and kleiger test unremarkable; Talar dome nontender; No pain at base of 5th MT; No tenderness over cuboid; No tenderness over N spot or navicular prominence No tenderness on posterior aspects of lateral and medial malleolus  Peroneal tendons still tight Negative tarsal tunnel tinel's Able to walk 4 steps. Contralateral ankle unremarkable  Left wrist positive grind still negative but no significant inflammation.  Good grip strength.       Impression and Recommendations:     This case required medical decision making of moderate complexity.      Note: This dictation was prepared with Dragon dictation along with smaller phrase technology. Any transcriptional errors that result from this process are unintentional.

## 2017-11-24 NOTE — Telephone Encounter (Signed)
Copied from CRM [id number]. Topic: Quick Communication - Rx Refill/Question >> Nov 24, 2017  5:51 PM Rebekah Campbell, Rebekah Campbell wrote: Medication: cephALEXin (KEFLEX) 500 MG capsule  Has the patient contacted their pharmacy? Yes (Agent: If no, request that the patient contact the pharmacy for the refill.) (Agent: If yes, when and what did the pharmacy advise?)  Preferred Pharmacy (with phone number or street name): [id number] - [place], [place] - [address]  Agent: Please be advised that RX refills may take up to 3 business days. We ask that you follow-up with your pharmacy.

## 2017-11-24 NOTE — Telephone Encounter (Signed)
Copied from CRM [id number]. Topic: General - Other >> Nov 24, 2017  3:49 PM Elliot Gault wrote: Relation to pt: self Call back number:  [phone]  Patient returning  Marylu Lund call regarding the message below:   Notes recorded by Mare Loan, CMA on 11/24/2017 at 10:08 AM EDT LVM with results. Also, she has an appointment 5/29 with Jimmey Ralph  Patient states symtopms have not improved requesting another round of antobotics, please advise   >> Nov 24, 2017  3:51 PM Elliot Gault wrote: Relation to pt: self Call back number:  [phone]  Patient returning  Marylu Lund call regarding the message below:   Notes recorded by Mare Loan, CMA on 11/24/2017 at 10:08 AM EDT LVM with results. Also, she has an appointment 5/29 with Jimmey Ralph  Patient states symtopms have not improved requesting another round of antobotics, please advise

## 2017-11-24 NOTE — Telephone Encounter (Signed)
Called back, VM

## 2017-11-24 NOTE — Telephone Encounter (Signed)
Please advise 

## 2017-11-25 ENCOUNTER — Ambulatory Visit: Payer: Self-pay | Admitting: *Deleted

## 2017-11-25 ENCOUNTER — Encounter: Payer: BLUE CROSS/BLUE SHIELD | Admitting: Family Medicine

## 2017-11-25 NOTE — Telephone Encounter (Signed)
   Answer Assessment - Initial Assessment Questions 1. ANTIBIOTIC: "What antibiotic are you taking?" "How many times per day?"     Patient finished antibiotic last night 2. DURATION: "When was the antibiotic started?"     7 day treatment 3. MAIN SYMPTOM: "What is the main symptom you are concerned about?"     urgency- frequency 4. FEVER: "Do you have a fever?" If so, ask: "What is it, how was it measured, and when did it start?"     No fever 5. OTHER SYMPTOMS: "Do you have any other symptoms?" (e.g., flank pain, vaginal discharge, blood in urine)     Slight pain with urination.  Protocols used: URINARY TRACT INFECTION ON ANTIBIOTIC FOLLOW-UP CALL [facility]   Patient is calling to report that she has just taken her last antibiotic- she is still having some residual symptoms. She is not sure the UTI is completely gone. She does have an appointment with Dr Jimmey Ralph on Monday. Her best contact is [phone]

## 2017-11-25 NOTE — Telephone Encounter (Signed)
See note

## 2017-11-25 NOTE — Telephone Encounter (Signed)
Message left for pt. To call back and discuss her urinary symptoms.

## 2017-11-26 ENCOUNTER — Ambulatory Visit: Payer: BLUE CROSS/BLUE SHIELD | Admitting: Family Medicine

## 2017-11-26 ENCOUNTER — Encounter: Payer: Self-pay | Admitting: Family Medicine

## 2017-11-26 DIAGNOSIS — M1812 Unilateral primary osteoarthritis of first carpometacarpal joint, left hand: Secondary | ICD-10-CM

## 2017-11-26 DIAGNOSIS — S82892A Other fracture of left lower leg, initial encounter for closed fracture: Secondary | ICD-10-CM

## 2017-11-26 MED ORDER — VITAMIN D (ERGOCALCIFEROL) 1.25 MG (50000 UNIT) PO CAPS: 50000.0000 [IU] | ORAL_CAPSULE | ORAL | 0 refills | Status: DC

## 2017-11-26 NOTE — Telephone Encounter (Signed)
LM for patient to return call.  Ok to relay message below with Dr. Lavone Neri instructions.

## 2017-11-26 NOTE — Telephone Encounter (Signed)
May still have urinary tract irritation from the UTI. Would recommend coming in for repeat UA and urine culture if symptoms are worsening or not improving over the next few days.  Katina Degree. Jimmey Ralph, MD 11/26/2017 10:36 AM

## 2017-11-26 NOTE — Assessment & Plan Note (Signed)
Discussed with patient at great length.  May need repeat injection.  Discussed icing regimen and home exercise.  Follow-up again as needed

## 2017-11-26 NOTE — Patient Instructions (Signed)
You are doing amazing Left ankle seems good as well  [email or web address] size small ankle Continue some of the exercises Refilled vitamin D See me again in 2 months if anything is giving you trouble

## 2017-11-26 NOTE — Assessment & Plan Note (Signed)
Well-healed that it started.  Discussed icing regimen and home exercise.  I do think patient will do well with conservative therapy and follow-up as needed

## 2017-11-27 ENCOUNTER — Ambulatory Visit (INDEPENDENT_AMBULATORY_CARE_PROVIDER_SITE_OTHER): Payer: BLUE CROSS/BLUE SHIELD | Admitting: Family Medicine

## 2017-11-27 ENCOUNTER — Encounter: Payer: Self-pay | Admitting: Family Medicine

## 2017-11-27 VITALS — BP 122/74 | HR 91 | Temp 97.5°F | Ht 64.0 in | Wt 122.0 lb

## 2017-11-27 DIAGNOSIS — Z13220 Encounter for screening for lipoid disorders: Secondary | ICD-10-CM | POA: Diagnosis not present

## 2017-11-27 DIAGNOSIS — R7989 Other specified abnormal findings of blood chemistry: Secondary | ICD-10-CM

## 2017-11-27 DIAGNOSIS — I10 Essential (primary) hypertension: Secondary | ICD-10-CM | POA: Insufficient documentation

## 2017-11-27 DIAGNOSIS — F3342 Major depressive disorder, recurrent, in full remission: Secondary | ICD-10-CM

## 2017-11-27 DIAGNOSIS — R350 Frequency of micturition: Secondary | ICD-10-CM

## 2017-11-27 DIAGNOSIS — Z114 Encounter for screening for human immunodeficiency virus [HIV]: Secondary | ICD-10-CM

## 2017-11-27 DIAGNOSIS — F900 Attention-deficit hyperactivity disorder, predominantly inattentive type: Secondary | ICD-10-CM | POA: Diagnosis not present

## 2017-11-27 LAB — LIPID PANEL
CHOL/HDL RATIO: 2
Cholesterol: 225 mg/dL — ABNORMAL HIGH (ref 0–200)
HDL: 128.4 mg/dL (ref >39.00)
LDL CALC: 83 mg/dL (ref 0–99)
NONHDL: 96.25
TRIGLYCERIDES: 67 mg/dL (ref 0.0–149.0)
VLDL: 13.4 mg/dL (ref 0.0–40.0)

## 2017-11-27 LAB — CBC
HCT: 40 % (ref 36.0–46.0)
Hemoglobin: 13.5 g/dL (ref 12.0–15.0)
MCHC: 33.7 g/dL (ref 30.0–36.0)
MCV: 99.2 fl (ref 78.0–100.0)
PLATELETS: 289 10*3/uL (ref 150.0–400.0)
RBC: 4.03 Mil/uL (ref 3.87–5.11)
RDW: 13 % (ref 11.5–15.5)
WBC: 5 10*3/uL (ref 4.0–10.5)

## 2017-11-27 LAB — COMPREHENSIVE METABOLIC PANEL
ALT: 27 U/L (ref 0–35)
AST: 45 U/L — AB (ref 0–37)
Albumin: 4.5 g/dL (ref 3.5–5.2)
Alkaline Phosphatase: 91 U/L (ref 39–117)
BUN: 12 mg/dL (ref 6–23)
CALCIUM: 9.9 mg/dL (ref 8.4–10.5)
CHLORIDE: 101 meq/L (ref 96–112)
CO2: 30 meq/L (ref 19–32)
CREATININE: 0.72 mg/dL (ref 0.40–1.20)
GFR: 90.05 mL/min (ref >60.00)
Glucose, Bld: 86 mg/dL (ref 70–99)
Potassium: 4 mEq/L (ref 3.5–5.1)
SODIUM: 140 meq/L (ref 135–145)
Total Bilirubin: 0.4 mg/dL (ref 0.2–1.2)
Total Protein: 7.2 g/dL (ref 6.0–8.3)

## 2017-11-27 LAB — POCT URINALYSIS DIPSTICK
BILIRUBIN UA: NEGATIVE
Glucose, UA: NEGATIVE
Ketones, UA: NEGATIVE
Leukocytes, UA: NEGATIVE
NITRITE UA: NEGATIVE
PH UA: 7 (ref 5.0–8.0)
PROTEIN UA: NEGATIVE
RBC UA: NEGATIVE
SPEC GRAV UA: 1.015 (ref 1.010–1.025)
UROBILINOGEN UA: 0.2 U/dL

## 2017-11-27 LAB — TSH: TSH: 2.24 u[IU]/mL (ref 0.35–4.50)

## 2017-11-27 LAB — VITAMIN D 25 HYDROXY (VIT D DEFICIENCY, FRACTURES): VITD: 93.9 ng/mL (ref 30.00–100.00)

## 2017-11-27 NOTE — Progress Notes (Signed)
    Subjective:  Rebekah Campbell is a 52 y.o. female who presents today for same-day appointment with a chief complaint of urinary frequency.   HPI:  Urinary Frequency, established problem Patient seen 1 week ago diagnosed with UTI.  Since then her dysuria and malodorous urine have improved, however she has had continued urinary frequency.  She started a probiotic yesterday but is not sure if it is helped.  She was started on Keflex and has finished that course of antibiotics.  No reported fevers or chills.  No back pain.  Low vitamin D level, established problem On vitamin D 50,000 international units daily.  Tolerating well without side effects.  Hypertension, chronic problem On HCTZ 25 mg daily.  Tolerating well without reported side effects.  ROS: Per HPI  PMH: She reports that she has never smoked. She has never used smokeless tobacco. She reports that she drinks alcohol. She reports that she does not use drugs.  Objective:  Physical Exam: BP 122/74 (BP Location: Left Arm, Patient Position: Sitting, Cuff Size: Normal)   Pulse 91   Temp (!) 97.5 F (36.4 C) (Oral)   Ht 5\' 4"  (1.626 m)   Wt 122 lb (55.3 kg)   SpO2 97%   BMI 20.94 kg/m   Gen: NAD, resting comfortably CV: RRR with no murmurs appreciated Pulm: NWOB, CTAB with no crackles, wheezes, or rhonchi GI: Normal bowel sounds present. Soft, Nontender, Nondistended. MSK: No edema, cyanosis, or clubbing noted Skin: Warm, dry Neuro: Grossly normal, moves all extremities Psych: Normal affect and thought content  Results for orders placed or performed in visit on 11/27/17 (from the past 24 hour(s))  POCT urinalysis dipstick     Status: None   Collection Time: 11/27/17  9:34 AM  Result Value Ref Range   Color, UA Yellow    Clarity, UA Clear    Glucose, UA Negative Negative   Bilirubin, UA Negative    Ketones, UA Negative    Spec Grav, UA 1.015 1.010 - 1.025   Blood, UA Negative    pH, UA 7.0 5.0 - 8.0   Protein,  UA Negative Negative   Urobilinogen, UA 0.2 0.2 or 1.0 E.U./dL   Nitrite, UA Negative    Leukocytes, UA Negative Negative   Appearance     Odor     Assessment/Plan:  Urinary frequency UA without signs of infection.  Symptoms likely secondary to bladder wall irritation due to recent UTI.  We will continue with watchful waiting.  Offered but has appeared in, however patient deferred.  We will check urine culture to rule out resistant UTI.  Discussed reasons return to care.  Follow-up as needed.  Essential hypertension At goal.  Continue HCTZ 25 mg daily as needed.  Check CMET, CBC, TSH.  Recurrent major depressive disorder, in full remission (HCC) Controlled.  Continue current treatment.  Low vitamin D level Continue 50,000 international units weekly.  Check vitamin D level today.  Preventative healthcare Check lipid panel.  Check HIV antibody.  Handout given for mammogram.  She is up-to-date on colonoscopy.  We will obtain these records.  She will return next week for CPE with Pap.  [name], MD 11/27/2017 10:02 AM

## 2017-11-27 NOTE — Assessment & Plan Note (Addendum)
At goal.  Continue HCTZ 25 mg daily as needed.  Check CMET, CBC, TSH.

## 2017-11-27 NOTE — Assessment & Plan Note (Signed)
Controlled. Continue current treatment. 

## 2017-11-27 NOTE — Assessment & Plan Note (Signed)
Continue 50,000 international units weekly.  Check vitamin D level today. 

## 2017-11-27 NOTE — Patient Instructions (Addendum)
It was nice to see you today!  Your urine test shows that the infection has most likely cleared.  We do not need to start any more antibiotics today.  We will check a urine culture which will be a more definitive test.  If your symptoms worsen, please let me know.  You reduce your mammogram.  Please call to get this scheduled.  We will check blood work today.  Please come back next week for your physical with Pap smear.  Take care, Dr Jimmey Ralph

## 2017-11-28 LAB — HIV ANTIBODY (ROUTINE TESTING W REFLEX): HIV: NONREACTIVE

## 2017-11-29 LAB — URINE CULTURE
MICRO NUMBER:: 90657962
SPECIMEN QUALITY:: ADEQUATE

## 2017-11-30 ENCOUNTER — Ambulatory Visit (INDEPENDENT_AMBULATORY_CARE_PROVIDER_SITE_OTHER): Payer: BLUE CROSS/BLUE SHIELD | Admitting: Family Medicine

## 2017-11-30 ENCOUNTER — Other Ambulatory Visit (HOSPITAL_COMMUNITY)
Admission: RE | Admit: 2017-11-30 | Discharge: 2017-11-30 | Disposition: A | Discharge status: Home / Self Care | Payer: BLUE CROSS/BLUE SHIELD | Source: Ambulatory Visit | Attending: Family Medicine | Admitting: Family Medicine

## 2017-11-30 ENCOUNTER — Encounter: Payer: Self-pay | Admitting: Family Medicine

## 2017-11-30 VITALS — BP 126/72 | HR 119 | Temp 98.6°F | Ht 64.0 in | Wt 117.4 lb

## 2017-11-30 DIAGNOSIS — Z124 Encounter for screening for malignant neoplasm of cervix: Secondary | ICD-10-CM | POA: Insufficient documentation

## 2017-11-30 DIAGNOSIS — F909 Attention-deficit hyperactivity disorder, unspecified type: Secondary | ICD-10-CM | POA: Insufficient documentation

## 2017-11-30 DIAGNOSIS — F329 Major depressive disorder, single episode, unspecified: Secondary | ICD-10-CM | POA: Diagnosis not present

## 2017-11-30 DIAGNOSIS — Z79899 Other long term (current) drug therapy: Secondary | ICD-10-CM | POA: Diagnosis not present

## 2017-11-30 DIAGNOSIS — I10 Essential (primary) hypertension: Secondary | ICD-10-CM | POA: Diagnosis not present

## 2017-11-30 DIAGNOSIS — Z0001 Encounter for general adult medical examination with abnormal findings: Secondary | ICD-10-CM | POA: Insufficient documentation

## 2017-11-30 DIAGNOSIS — E559 Vitamin D deficiency, unspecified: Secondary | ICD-10-CM | POA: Insufficient documentation

## 2017-11-30 NOTE — Patient Instructions (Signed)
Preventive Care 40-64 Years, Female Preventive care refers to lifestyle choices and visits with your health care provider that can promote health and wellness. What does preventive care include?  A yearly physical exam. This is also called an annual well check.  Dental exams once or twice a year.  Routine eye exams. Ask your health care provider how often you should have your eyes checked.  Personal lifestyle choices, including: ? Daily care of your teeth and gums. ? Regular physical activity. ? Eating a healthy diet. ? Avoiding tobacco and drug use. ? Limiting alcohol use. ? Practicing safe sex. ? Taking low-dose aspirin daily starting at age 53. ? Taking vitamin and mineral supplements as recommended by your health care provider. What happens during an annual well check? The services and screenings done by your health care provider during your annual well check will depend on your age, overall health, lifestyle risk factors, and family history of disease. Counseling Your health care provider may ask you questions about your:  Alcohol use.  Tobacco use.  Drug use.  Emotional well-being.  Home and relationship well-being.  Sexual activity.  Eating habits.  Work and work Statistician.  Method of birth control.  Menstrual cycle.  Pregnancy history.  Screening You may have the following tests or measurements:  Height, weight, and BMI.  Blood pressure.  Lipid and cholesterol levels. These may be checked every 5 years, or more frequently if you are over 53 years old.  Skin check.  Lung cancer screening. You may have this screening every year starting at age 53 if you have a 30-pack-year history of smoking and currently smoke or have quit within the past 15 years.  Fecal occult blood test (FOBT) of the stool. You may have this test every year starting at age 53.  Flexible sigmoidoscopy or colonoscopy. You may have a sigmoidoscopy every 5 years or a colonoscopy  every 10 years starting at age 53.  Hepatitis C blood test.  Hepatitis B blood test.  Sexually transmitted disease (STD) testing.  Diabetes screening. This is done by checking your blood sugar (glucose) after you have not eaten for a while (fasting). You may have this done every 1-3 years.  Mammogram. This may be done every 1-2 years. Talk to your health care provider about when you should start having regular mammograms. This may depend on whether you have a family history of breast cancer.  BRCA-related cancer screening. This may be done if you have a family history of breast, ovarian, tubal, or peritoneal cancers.  Pelvic exam and Pap test. This may be done every 3 years starting at age 53. Starting at age 53, this may be done every 5 years if you have a Pap test in combination with an HPV test.  Bone density scan. This is done to screen for osteoporosis. You may have this scan if you are at high risk for osteoporosis.  Discuss your test results, treatment options, and if necessary, the need for more tests with your health care provider. Vaccines Your health care provider may recommend certain vaccines, such as:  Influenza vaccine. This is recommended every year.  Tetanus, diphtheria, and acellular pertussis (Tdap, Td) vaccine. You may need a Td booster every 10 years.  Varicella vaccine. You may need this if you have not been vaccinated.  Zoster vaccine. You may need this after age 5.  Measles, mumps, and rubella (MMR) vaccine. You may need at least one dose of MMR if you were born in  1958 or later. You may also need a second dose.  Pneumococcal 13-valent conjugate (PCV13) vaccine. You may need this if you have certain conditions and were not previously vaccinated.  Pneumococcal polysaccharide (PPSV23) vaccine. You may need one or two doses if you smoke cigarettes or if you have certain conditions.  Meningococcal vaccine. You may need this if you have certain  conditions.  Hepatitis A vaccine. You may need this if you have certain conditions or if you travel or work in places where you may be exposed to hepatitis A.  Hepatitis B vaccine. You may need this if you have certain conditions or if you travel or work in places where you may be exposed to hepatitis B.  Haemophilus influenzae type b (Hib) vaccine. You may need this if you have certain conditions.  Talk to your health care provider about which screenings and vaccines you need and how often you need them. This information is not intended to replace advice given to you by your health care provider. Make sure you discuss any questions you have with your health care provider. Document Released: 07/13/2015 Document Revised: 03/05/2016 Document Reviewed: 04/17/2015 Elsevier Interactive Patient Education  2018 [employer]  

## 2017-11-30 NOTE — Progress Notes (Signed)
Subjective:  Rebekah Campbell is a 52 y.o. female who presents today for her annual comprehensive physical exam.    HPI:  She has no acute complaints today.   Lifestyle Diet: No specific diets.  Exercise: Swims 3-4 times weekly. Plays tennis regularly.   No flowsheet data found.  Health Maintenance Due  Topic Date Due  . TETANUS/TDAP  11/17/1983  . PAP SMEAR  11/16/1985  . COLONOSCOPY  11/17/2014    ROS: Abdominal pain, otherwise a complete review of systems was negative.   PMH:  The following were reviewed and entered/updated in epic: Past Medical History:  Diagnosis Date  . ADHD (attention deficit hyperactivity disorder)   . Depression   . HTN (hypertension)   . Inguinal hernia of right side with obstruction and without gangrene    Patient Active Problem List   Diagnosis Date Noted  . Essential hypertension 11/27/2017  . Low vitamin D level 11/27/2017  . Arthritis of carpometacarpal [facility]) joint of left thumb 10/27/2017  . TFCC (triangular fibrocartilage complex) injury, left, initial encounter 10/02/2017  . Avulsion fracture of ankle, left, closed, initial encounter 10/02/2017  . Degenerative tear of glenoid labrum of left shoulder 08/28/2017  . Inguinal hernia of right side without obstruction or gangrene 09/29/2016  . Elevated AST (SGOT) 07/30/2016  . Attention deficit hyperactivity disorder (ADHD), predominantly inattentive type 01/03/2016  . Recurrent major depressive disorder, in full remission (HCC) 01/03/2016  . Insomnia 09/21/2015   Past Surgical History:  Procedure Laterality Date  . NO PAST SURGERIES    . TONSILLECTOMY     Family History  Problem Relation Age of Onset  . Anesthesia problems Neg Hx    Medications- reviewed and updated Current Outpatient Medications  Medication Sig Dispense Refill  . acetaminophen (TYLENOL) 500 MG tablet Take 500 mg by mouth every 6 (six) hours as needed for mild pain.    Marland Kitchen amphetamine-dextroamphetamine  (ADDERALL) 30 MG tablet Take 15 mg by mouth 2 (two) times daily.    . clonazePAM (KLONOPIN) 0.5 MG tablet TAKE 1 TABLET BY MOUTH TWICE DAILY AS NEEDED FOR ANXIETY (Patient taking differently: TAKE 0.5mg  BY MOUTH TWICE DAILY AS NEEDED FOR ANXIETY) 20 tablet 0  . Diclofenac Sodium 2 % SOLN Place 2 g onto the skin 2 (two) times daily. 112 g 3  . hydrochlorothiazide (HYDRODIURIL) 25 MG tablet TAKE 1 TABLET BY MOUTH DAILY (Patient taking differently: TAKE 1 tablet as needed) 90 tablet 0  . Melatonin 10 MG TABS Take 3 tablets by mouth at bedtime as needed (for sleep).    . Vitamin D, Ergocalciferol, (DRISDOL) 50000 units CAPS capsule Take 1 capsule (50,000 Units total) by mouth every 7 (seven) days. 12 capsule 0   No current facility-administered medications for this visit.    Facility-Administered Medications Ordered in Other Visits  Medication Dose Route Frequency Provider Last Rate Last Dose  . fentaNYL (SUBLIMAZE) injection    PRN Jeani Hawking, CRNA   50 mcg at 10/18/11 0926  . lidocaine (cardiac) 100 mg/45ml (XYLOCAINE) 20 MG/ML injection 2%    PRN Jeani Hawking, CRNA   80 mg at 10/18/11 0920  . ondansetron (ZOFRAN) injection    PRN Jeani Hawking, CRNA   4 mg at 10/18/11 1527  . propofol (DIPRIVAN) 10 mg/mL bolus    PRN Jeani Hawking, CRNA   200 mg at 10/18/11 0920    Allergies-reviewed and updated Allergies  Allergen Reactions  . Codeine Nausea And Vomiting  Social History   Socioeconomic History  . Marital status: Single    Spouse name: Not on file  . Number of children: Not on file  . Years of education: Not on file  . Highest education level: Not on file  Occupational History  . Not on file  Social Needs  . Financial resource strain: Not on file  . Food insecurity:    Worry: Not on file    Inability: Not on file  . Transportation needs:    Medical: Not on file    Non-medical: Not on file  Tobacco Use  . Smoking status: Never Smoker  . Smokeless tobacco:  Never Used  Substance and Sexual Activity  . Alcohol use: Yes    Alcohol/week: 0.0 oz    Types: 3 - 5 Glasses of wine per week  . Drug use: No  . Sexual activity: Yes  Lifestyle  . Physical activity:    Days per week: Not on file    Minutes per session: Not on file  . Stress: Not on file  Relationships  . Social connections:    Talks on phone: Not on file    Gets together: Not on file    Attends religious service: Not on file    Active member of club or organization: Not on file    Attends meetings of clubs or organizations: Not on file    Relationship status: Not on file  Other Topics Concern  . Not on file  Social History Narrative  . Not on file   Objective:  Physical Exam: BP 126/72 (BP Location: Left Arm, Patient Position: Sitting, Cuff Size: Normal)   Pulse (!) 119   Temp 98.6 F (37 C) (Oral)   Ht 5\' 4"  (1.626 m)   Wt 117 lb 6.4 oz (53.3 kg)   SpO2 99%   BMI 20.15 kg/m   Body mass index is 20.15 kg/m. Wt Readings from Last 3 Encounters:  11/30/17 117 lb 6.4 oz (53.3 kg)  11/27/17 122 lb (55.3 kg)  11/26/17 116 lb (52.6 kg)   Gen: NAD, resting comfortably HEENT: TMs normal bilaterally. OP clear. No thyromegaly noted.  CV: RRR with no murmurs appreciated Pulm: NWOB, CTAB with no crackles, wheezes, or rhonchi GI: Normal bowel sounds present. Soft, Nontender, Nondistended.  Small amount of scar tissue noted at left inguinal crease. GU: Normal internal/external female genitalia.  Chaperone present for exam. MSK: no edema, cyanosis, or clubbing noted Skin: warm, dry Neuro: CN2-12 grossly intact. Strength 5/5 in upper and lower extremities. Reflexes symmetric and intact bilaterally.  Psych: Normal affect and thought content  Assessment/Plan:  Abdominal pain No red flag signs or symptoms.  Likely secondary to small amount of scar tissue she has in place.  Not currently having any pain.  Continue with watchful waiting.  Discussed reasons return to  care.  Preventative Healthcare: Pap smear obtained today.  Patient will be getting mammogram soon.  Obtain records for her colonoscopy.  Otherwise up-to-date on screenings.  Reviewed most recent lipid panel and other lab tests with patient during office visit.  Patient Counseling(The following topics were reviewed and/or handout was given):  -Nutrition: Stressed importance of moderation in sodium/caffeine intake, saturated fat and cholesterol, caloric balance, sufficient intake of fresh fruits, vegetables, and fiber.  -Stressed the importance of regular exercise.   -Substance Abuse: Discussed cessation/primary prevention of tobacco, alcohol, or other drug use; driving or other dangerous activities under the influence; availability of treatment for abuse.   -Injury  prevention: Discussed safety belts, safety helmets, smoke detector, smoking near bedding or upholstery.   -Sexuality: Discussed sexually transmitted diseases, partner selection, use of condoms, avoidance of unintended pregnancy and contraceptive alternatives.   -Dental health: Discussed importance of regular tooth brushing, flossing, and dental visits.  -Health maintenance and immunizations reviewed. Please refer to Health maintenance section.  Return to care in 1 year for next preventative visit.   [name], MD 11/30/2017 9:36 AM

## 2017-12-02 LAB — CYTOLOGY - PAP: HPV: NOT DETECTED

## 2017-12-08 ENCOUNTER — Telehealth: Payer: Self-pay | Admitting: Family Medicine

## 2017-12-08 NOTE — Telephone Encounter (Signed)
Copied from CRM [id number]. Topic: Quick Communication - Lab Results >> Dec 08, 2017  2:38 PM Donnamarie Poaghompson, Joellen Y, CMA wrote: Called patient to inform them of 12/08/2017 lab results. When patient returns call, triage nurse may disclose results. >> Dec 08, 2017  4:55 PM Arlyss Gandyichardson, Chiquita Heckert N, NT wrote: Pt would like a call to discuss her pap smear results further. She states she was told that she will have to come in for another one and she is wanting to know if she will be charged again for her pap smear and pathology result.

## 2017-12-08 NOTE — Telephone Encounter (Signed)
See note

## 2017-12-09 ENCOUNTER — Telehealth: Payer: Self-pay | Admitting: *Deleted

## 2017-12-09 NOTE — Telephone Encounter (Signed)
Pt states she was informed her recent PAP was rejected due to insufficient specimen ('scant cellularity'.). Note from Dr. Jimmey RalphParker reads to return in 6-12 months for repeat.  Pt questioning the delay in repeat exam.  Also questioning if she will be charged for appt, exam and pathology. Per pt, she will not be available to take call until 0930. Please advise: [phone]

## 2017-12-09 NOTE — Telephone Encounter (Signed)
See note

## 2017-12-09 NOTE — Telephone Encounter (Signed)
Patient is calling in reference to her message below. Please advise

## 2017-12-10 NOTE — Telephone Encounter (Signed)
LM for patient to return call.  CRM started. 

## 2017-12-10 NOTE — Telephone Encounter (Signed)
See other message

## 2017-12-10 NOTE — Telephone Encounter (Signed)
The testing that was able to be completed showed no abnormalities.  Per Dr. Jimmey RalphParker, patient does not need to have a repeat Pap for 6-12 months as there were no abnormal cells noted with what was able to be tested.  Unfortunately, this sometimes happens with Pap smears.  If patient prefers to have another Pap smear sooner than 6-12 months from now, she is welcome to schedule an appointment.  She will not be charged for the repeat exam.

## 2018-01-11 ENCOUNTER — Ambulatory Visit (INDEPENDENT_AMBULATORY_CARE_PROVIDER_SITE_OTHER): Payer: BLUE CROSS/BLUE SHIELD | Admitting: Family Medicine

## 2018-01-11 ENCOUNTER — Encounter: Payer: Self-pay | Admitting: Family Medicine

## 2018-01-11 ENCOUNTER — Other Ambulatory Visit (HOSPITAL_COMMUNITY)
Admission: RE | Admit: 2018-01-11 | Discharge: 2018-01-11 | Disposition: A | Discharge status: Home / Self Care | Payer: BLUE CROSS/BLUE SHIELD | Source: Ambulatory Visit | Attending: Family Medicine | Admitting: Family Medicine

## 2018-01-11 VITALS — BP 122/70 | HR 81 | Temp 98.1°F | Ht 64.0 in | Wt 121.6 lb

## 2018-01-11 DIAGNOSIS — Z124 Encounter for screening for malignant neoplasm of cervix: Secondary | ICD-10-CM | POA: Diagnosis not present

## 2018-01-11 DIAGNOSIS — R87615 Unsatisfactory cytologic smear of cervix: Secondary | ICD-10-CM | POA: Diagnosis present

## 2018-01-11 NOTE — Progress Notes (Addendum)
   Subjective:  Rebekah Campbell is a 52 y.o. female who presents today with a chief complaint of pap follow up.   HPI:  Cervical Cancer Screening,  Patient seen about 6 weeks ago for this. Had pap smear that showed inadequate cells. No reported vaginal bleeding.   ROS: Per HPI  Objective:  Physical Exam: BP 122/70 (BP Location: Left Arm, Patient Position: Sitting, Cuff Size: Normal)   Pulse 81   Temp 98.1 F (36.7 C) (Oral)   Ht 5\' 4"  (1.626 m)   Wt 121 lb 9.6 oz (55.2 kg)   SpO2 98%   BMI 20.87 kg/m   Gen: NAD, resting comfortably GU: Normal external and internal female genitalia  Assessment/Plan:  Cervical Cancer screening/ Inadequate cervical cytology sample Normal exam today. Repeat papsmear performed.   Time Spent: I spent 10 minutes face-to-face with the patient, with more than half spent on counseling for preventative healthcare including cervical cancer screening and colon cancer screening.    [name], MD 01/11/2018 10:40 AM

## 2018-01-11 NOTE — Patient Instructions (Addendum)
It was very nice to see you today!  We repeated your pap test today. Everything looked normal on your exam.  We will call you with results when they are available.   Take care, Dr Jimmey RalphParker

## 2018-01-12 LAB — CYTOLOGY - PAP
Diagnosis: NEGATIVE
HPV (WINDOPATH): NOT DETECTED

## 2018-01-12 NOTE — Progress Notes (Signed)
Please inform patient of the following:  Her pap smear is NORMAL. She will need a repeat in 5 years.  [name], MD 01/12/2018 12:52 PM

## 2018-01-19 NOTE — Progress Notes (Signed)
[name] [address] [place], [place] [address] Phone: [phone] Subjective:    I'm seeing this patient by the request  of:    CC: Left ankle pain  BJY:[id number]  Rebekah Campbell is a 52 y.o. female coming in with complaint of ankle pain. States that the last visit she was doing well. Stepped in a hole walking her dog and inverted her ankle. Taking it easy on running. She believes she may have reinjured it.  Patient was found to have a lateral malleolus fracture but seem to be healing at last follow-up.  Patient has been increasing activity up to this last injury.  Onset- 3 weeks ago Location- lateral ankle States it did swell initially but seems to come down much quicker than the other time.  Has been able to bear weight this entire time.  Seems to hurt more with a numbing sensation than a true pain.     Past Medical History:  Diagnosis Date  . ADHD (attention deficit hyperactivity disorder)   . Depression   . HTN (hypertension)   . Inguinal hernia of right side with obstruction and without gangrene    Past Surgical History:  Procedure Laterality Date  . HERNIA REPAIR    . TONSILLECTOMY     Social History   Socioeconomic History  . Marital status: Single    Spouse name: Not on file  . Number of children: Not on file  . Years of education: Not on file  . Highest education level: Not on file  Occupational History  . Not on file  Social Needs  . Financial resource strain: Not on file  . Food insecurity:    Worry: Not on file    Inability: Not on file  . Transportation needs:    Medical: Not on file    Non-medical: Not on file  Tobacco Use  . Smoking status: Never Smoker  . Smokeless tobacco: Never Used  Substance and Sexual Activity  . Alcohol use: Yes    Alcohol/week: 1.8 - 3.0 oz    Types: 3 - 5 Glasses of wine per week  . Drug use: No  . Sexual activity: Yes  Lifestyle  . Physical activity:    Days per week: Not on  file    Minutes per session: Not on file  . Stress: Not on file  Relationships  . Social connections:    Talks on phone: Not on file    Gets together: Not on file    Attends religious service: Not on file    Active member of club or organization: Not on file    Attends meetings of clubs or organizations: Not on file    Relationship status: Not on file  Other Topics Concern  . Not on file  Social History Narrative  . Not on file   Allergies  Allergen Reactions  . Codeine Nausea And Vomiting   Family History  Problem Relation Age of Onset  . Anesthesia problems Neg Hx      Past medical history, social, surgical and family history all reviewed in electronic medical record.  No pertanent information unless stated regarding to the chief complaint.   Review of Systems:Review of systems updated and as accurate as of 01/19/18  No headache, visual changes, nausea, vomiting, diarrhea, constipation, dizziness, abdominal pain, skin rash, fevers, chills, night sweats, weight loss, swollen lymph nodes, body aches, joint swelling, muscle aches, chest pain, shortness of breath, mood changes.  Objective  There were no vitals taken for this visit. Systems examined below as of 01/19/18   General: No apparent distress alert and oriented x3 mood and affect normal, dressed appropriately.  HEENT: Pupils equal, extraocular movements intact  Respiratory: Patient's speak in full sentences and does not appear short of breath  Cardiovascular: No lower extremity edema, non tender, no erythema  Skin: Warm dry intact with no signs of infection or rash on extremities or on axial skeleton.  Abdomen: Soft nontender  Neuro: Cranial nerves II through XII are intact, neurovascularly intact in all extremities with 2+ DTRs and 2+ pulses.  Lymph: No lymphadenopathy of posterior or anterior cervical chain or axillae bilaterally.  Gait normal with good balance and coordination.  MSK:  Non tender with full range  of motion and good stability and symmetric strength and tone of shoulders, elbows, wrist, hip, knee bilaterally.  Ankle: Left No visible erythema or swelling. Range of motion is full in all directions.  Mild pain with dorsiflexion against resistance Strength is 5/5 in all directions. Stable lateral and medial ligaments; squeeze test and kleiger test unremarkable; Talar dome minorly tender more over the medial compartment; No pain at base of 5th MT; No tenderness over cuboid; No tenderness over N spot or navicular prominence No tenderness on posterior aspects of lateral and medial malleolus No sign of peroneal tendon subluxations or tenderness to palpation Negative tarsal tunnel tinel's Able to walk 4 steps. Contralateral ankle unremarkable      Impression and Recommendations:     This case required medical decision making of moderate complexity.      Note: This dictation was prepared with Dragon dictation along with smaller phrase technology. Any transcriptional errors that result from this process are unintentional.

## 2018-01-21 ENCOUNTER — Encounter: Payer: Self-pay | Admitting: Family Medicine

## 2018-01-21 ENCOUNTER — Ambulatory Visit: Payer: BLUE CROSS/BLUE SHIELD | Admitting: Family Medicine

## 2018-01-21 DIAGNOSIS — S82892A Other fracture of left lower leg, initial encounter for closed fracture: Secondary | ICD-10-CM | POA: Diagnosis not present

## 2018-01-21 MED ORDER — VITAMIN D (ERGOCALCIFEROL) 1.25 MG (50000 UNIT) PO CAPS: 50000.0000 [IU] | ORAL_CAPSULE | ORAL | 0 refills | Status: DC

## 2018-01-21 MED ORDER — GABAPENTIN 100 MG PO CAPS: 200.0000 mg | ORAL_CAPSULE | Freq: Every day | ORAL | 3 refills | Status: DC

## 2018-01-21 NOTE — Assessment & Plan Note (Signed)
I do not believe the patient has any significant re-exacerbation of the injury.  Patient though did have may be some mild twisting of the motion.  I do not see any significant swelling.  Full range of motion of the ankle.  Discussed wearing the brace regularly for 1 week, gabapentin for any nighttime relief, topical anti-inflammatories.  If not completely resolved in the next 2 weeks then come back for further evaluation

## 2018-01-21 NOTE — Patient Instructions (Signed)
Good to see you  Ice is your friend Ice 20 minutes 2 times daily. Usually after activity and before bed. pennsaid pinkie amount topically 2 times daily as needed.  Once weekly vitamin D Gabapentin 200mg  at night for 1 week then as needed Wear the brace daily for 1 week to be safe.  You should do fine and be healed in 2 weeks but if not then see me again in 3 weeks

## 2018-01-27 ENCOUNTER — Ambulatory Visit: Payer: BLUE CROSS/BLUE SHIELD | Admitting: Family Medicine

## 2018-01-29 ENCOUNTER — Telehealth: Payer: Self-pay | Admitting: Family Medicine

## 2018-01-29 NOTE — Telephone Encounter (Signed)
Copied from CRM #[id number]. Topic: Quick Communication - Rx Refill/Question >> Jan 29, 2018  3:08 PM Burchel, Abbi R wrote: Medication: Vitamin D, Ergocalciferol, (DRISDOL) 50000 units CAPS capsule  Has the patient contacted their pharmacy? Yes.    [id number] - [place], [place] - [address] [place] [id number] Phone: [phone] Fax: [phone]   Pt was advised that RX refills may take up to 3 business days. We ask that you follow-up with your pharmacy.

## 2018-02-01 ENCOUNTER — Other Ambulatory Visit: Payer: Self-pay

## 2018-02-01 MED ORDER — VITAMIN D (ERGOCALCIFEROL) 1.25 MG (50000 UNIT) PO CAPS: 50000.0000 [IU] | ORAL_CAPSULE | ORAL | 0 refills | Status: DC

## 2018-02-01 NOTE — Telephone Encounter (Signed)
Refilled

## 2018-02-04 ENCOUNTER — Ambulatory Visit (INDEPENDENT_AMBULATORY_CARE_PROVIDER_SITE_OTHER)
Admission: RE | Admit: 2018-02-04 | Discharge: 2018-02-04 | Disposition: A | Discharge status: Home / Self Care | Payer: BLUE CROSS/BLUE SHIELD | Source: Ambulatory Visit | Attending: Family Medicine | Admitting: Family Medicine

## 2018-02-04 ENCOUNTER — Ambulatory Visit: Payer: BLUE CROSS/BLUE SHIELD | Admitting: Family Medicine

## 2018-02-04 ENCOUNTER — Ambulatory Visit: Payer: Self-pay | Admitting: Family Medicine

## 2018-02-04 ENCOUNTER — Encounter: Payer: Self-pay | Admitting: Family Medicine

## 2018-02-04 ENCOUNTER — Ambulatory Visit: Payer: Self-pay

## 2018-02-04 VITALS — BP 112/68 | Ht 64.0 in | Wt 121.0 lb

## 2018-02-04 DIAGNOSIS — M25561 Pain in right knee: Secondary | ICD-10-CM | POA: Diagnosis not present

## 2018-02-04 NOTE — Progress Notes (Signed)
Rebekah Campbell - 52 y.o. female MRN [id number]  Date of birth: Jun 09, 1966  SUBJECTIVE:  Including CC & ROS.  Chief Complaint  Patient presents with  . Knee Pain    Rebekah Campbell is a 52 y.o. female that is presenting with right knee pain. Last night she was walking in her house and tripped over her dog and fell on her stone floors. She has been applying ice and elevating her knee. Pain located at her mid-patella. Pain is mild during flexion. Admits to swelling. Localized to the knee.    Review of Systems  Constitutional: Negative for fever.  HENT: Negative for congestion.   Respiratory: Negative for cough.   Cardiovascular: Negative for chest pain.  Gastrointestinal: Negative for abdominal pain.  Musculoskeletal: Negative for gait problem.  Skin: Negative for color change.  Neurological: Negative for weakness.  Hematological: Negative for adenopathy.  Psychiatric/Behavioral: Negative for agitation.    HISTORY: Past Medical, Surgical, Social, and Family History Reviewed & Updated per EMR.   Pertinent Historical Findings include:  Past Medical History:  Diagnosis Date  . ADHD (attention deficit hyperactivity disorder)   . Depression   . HTN (hypertension)   . Inguinal hernia of right side with obstruction and without gangrene     Past Surgical History:  Procedure Laterality Date  . HERNIA REPAIR    . TONSILLECTOMY      Allergies  Allergen Reactions  . Codeine Nausea And Vomiting    Family History  Problem Relation Age of Onset  . Anesthesia problems Neg Hx      Social History   Socioeconomic History  . Marital status: Single    Spouse name: Not on file  . Number of children: Not on file  . Years of education: Not on file  . Highest education level: Not on file  Occupational History  . Not on file  Social Needs  . Financial resource strain: Not on file  . Food insecurity:    Worry: Not on file    Inability: Not on file  . Transportation needs:   Medical: Not on file    Non-medical: Not on file  Tobacco Use  . Smoking status: Never Smoker  . Smokeless tobacco: Never Used  Substance and Sexual Activity  . Alcohol use: Yes    Alcohol/week: 3.0 - 5.0 standard drinks    Types: 3 - 5 Glasses of wine per week  . Drug use: No  . Sexual activity: Yes  Lifestyle  . Physical activity:    Days per week: Not on file    Minutes per session: Not on file  . Stress: Not on file  Relationships  . Social connections:    Talks on phone: Not on file    Gets together: Not on file    Attends religious service: Not on file    Active member of club or organization: Not on file    Attends meetings of clubs or organizations: Not on file    Relationship status: Not on file  . Intimate partner violence:    Fear of current or ex partner: Not on file    Emotionally abused: Not on file    Physically abused: Not on file    Forced sexual activity: Not on file  Other Topics Concern  . Not on file  Social History Narrative  . Not on file     PHYSICAL EXAM:  VS: BP 112/68   Ht 5\' 4"  (1.626 m)   Wt 121  lb (54.9 kg)   BMI 20.77 kg/m  Physical Exam Gen: NAD, alert, cooperative with exam, well-appearing ENT: normal lips, normal nasal mucosa,  Eye: normal EOM, normal conjunctiva and lids CV:  no edema, +2 pedal pulses   Resp: no accessory muscle use, non-labored,   Skin: no rashes, no areas of induration  Neuro: normal tone, normal sensation to touch Psych:  normal insight, alert and oriented MSK:  Right Knee: Normal to inspection with no erythema or obvious bony abnormalities. Mild effusion Palpation normal with no warmth, joint line tenderness, or condyle tenderness. Mild patellar tenderness  ROM full in flexion and extension and lower leg rotation. Ligaments with solid consistent endpoints including  LCL, MCL. Negative Mcmurray's Pain with patellar compression. Patellar and quadriceps tendons unremarkable. Hamstring and quadriceps  strength is normal.  Neurovascularly intact   Limited ultrasound: right knee:  Moderate effusion  Normal appearing QT and PT  Normal appearing patella  Normal appearing lateral mild medial joint line narrowing  Summary: moderate knee effusion   Ultrasound and interpretation by Clare GandyJeremy Schmitz, MD       ASSESSMENT & PLAN:   Acute pain of right knee Significant effusion on US. No fracture appreciated. Possible to have exacerbation of arthritis or meniscal irritation given amount of effusion  - counseled on supportive care - xray  - if no improvement consider aspiration and injection.

## 2018-02-04 NOTE — Telephone Encounter (Signed)
Patient tripped over dog and fell on stone floor. Patient hit her knee on the floor and has had some swelling and soreness. She has been using ice and a brace- but she is limping and has noticed some slight swelling.  Reason for Disposition . [1] Limp when walking AND [2] due to a twisted knee  Answer Assessment - Initial Assessment Questions 1. MECHANISM: "How did the injury happen?" (e.g., twisting injury, direct blow)      Fell on knee on stone floor 2. ONSET: "When did the injury happen?" (Minutes or hours ago)      Last night 3. LOCATION: "Where is the injury located?"      Right knee 4. APPEARANCE of INJURY: "What does the injury look like?"      Swelling on left side of knee- patient is wearing knee brace for support.  5. SEVERITY: "Can you put weight on that leg?" "Can you walk?"      Patient has been walking- she is limping 6. SIZE: For cuts, bruises, or swelling, ask: "How large is it?" (e.g., inches or centimeters;  entire joint)      Some small bruising and swelling is the size of an egg on side of leg 7. PAIN: "Is there pain?" If so, ask: "How bad is the pain?"    (e.g., Scale 1-10; or mild, moderate, severe)     2- on inside of leg 8. TETANUS: For any breaks in the skin, ask: "When was the last tetanus booster?"     no 9. OTHER SYMPTOMS: "Do you have any other symptoms?"  (e.g., "pop" when knee injured, swelling, locking, buckling)      No- patient is able to move knee- has to stop when she gets to 45 degree angle 10. PREGNANCY: "Is there any chance you are pregnant?" "When was your last menstrual period?"       n/a  Protocols used: KNEE INJURY-A-AH

## 2018-02-04 NOTE — Patient Instructions (Signed)
Nice to meet you  Please try ice the knee  Please try compression  I will call you with the xray results.  Please take an anti-inflammatory if the pain is significant  Please follow up with me in 2-3 weeks if the pain hasn't improved.

## 2018-02-05 DIAGNOSIS — M25561 Pain in right knee: Secondary | ICD-10-CM | POA: Insufficient documentation

## 2018-02-05 NOTE — Telephone Encounter (Signed)
Left VM for patient. If she calls back please have her speak with a nurse/CMA and inform that her xray shows no fracture but a moderate effusion was evident. The [facility] can report results to patient.   If any questions then please take the best time and phone number to call and I will try to call her back.   Myra RudeSchmitz, Daxon Kyne E, MD  Primary Care and Sports Medicine 02/05/2018, 5:06 PM

## 2018-02-05 NOTE — Telephone Encounter (Signed)
   Copied from CRM [id number]. Topic: General - Other >> Feb 05, 2018  1:33 PM Percival SpanishKennedy, Cheryl W wrote:  Pt call to up on Xray results

## 2018-02-05 NOTE — Assessment & Plan Note (Signed)
Significant effusion on US. No fracture appreciated. Possible to have exacerbation of arthritis or meniscal irritation given amount of effusion  - counseled on supportive care - xray  - if no improvement consider aspiration and injection.

## 2018-02-11 ENCOUNTER — Ambulatory Visit: Payer: BLUE CROSS/BLUE SHIELD | Admitting: Family Medicine

## 2018-02-25 ENCOUNTER — Ambulatory Visit: Payer: BLUE CROSS/BLUE SHIELD | Admitting: Family Medicine

## 2018-02-26 ENCOUNTER — Encounter: Payer: Self-pay | Admitting: Family Medicine

## 2018-02-26 ENCOUNTER — Ambulatory Visit: Payer: BLUE CROSS/BLUE SHIELD | Admitting: Family Medicine

## 2018-02-26 VITALS — BP 98/54 | HR 99 | Ht 64.0 in

## 2018-02-26 DIAGNOSIS — M79675 Pain in left toe(s): Secondary | ICD-10-CM

## 2018-02-26 NOTE — Progress Notes (Signed)
Rebekah Campbell - 52 y.o. female MRN [id number]  Date of birth: Oct 20, 1965  SUBJECTIVE:  Including CC & ROS.  Chief Complaint  Patient presents with  . Toe Injury    Rebekah Campbell is a 52 y.o. female that is presenting with toe pain. She hit her pinkie toe on a scale two days ago. Swelling and tenderness present. Pain is mild to severe bearing weight on it. Worse with flexion. She has not taken anything for the pain. Pain with walking. No redness. Does have bruising.    Review of Systems  Constitutional: Negative for fever.  HENT: Negative for congestion.   Respiratory: Negative for cough.   Cardiovascular: Negative for chest pain.  Gastrointestinal: Negative for abdominal pain.  Musculoskeletal: Negative for back pain.  Skin: Positive for color change.  Neurological: Negative for weakness.  Psychiatric/Behavioral: Negative for agitation.    HISTORY: Past Medical, Surgical, Social, and Family History Reviewed & Updated per EMR.   Pertinent Historical Findings include:  Past Medical History:  Diagnosis Date  . ADHD (attention deficit hyperactivity disorder)   . Depression   . HTN (hypertension)   . Inguinal hernia of right side with obstruction and without gangrene     Past Surgical History:  Procedure Laterality Date  . HERNIA REPAIR    . TONSILLECTOMY      Allergies  Allergen Reactions  . Codeine Nausea And Vomiting    Family History  Problem Relation Age of Onset  . Anesthesia problems Neg Hx      Social History   Socioeconomic History  . Marital status: Single    Spouse name: Not on file  . Number of children: Not on file  . Years of education: Not on file  . Highest education level: Not on file  Occupational History  . Not on file  Social Needs  . Financial resource strain: Not on file  . Food insecurity:    Worry: Not on file    Inability: Not on file  . Transportation needs:    Medical: Not on file    Non-medical: Not on file  Tobacco Use   . Smoking status: Never Smoker  . Smokeless tobacco: Never Used  Substance and Sexual Activity  . Alcohol use: Yes    Alcohol/week: 3.0 - 5.0 standard drinks    Types: 3 - 5 Glasses of wine per week  . Drug use: No  . Sexual activity: Yes  Lifestyle  . Physical activity:    Days per week: Not on file    Minutes per session: Not on file  . Stress: Not on file  Relationships  . Social connections:    Talks on phone: Not on file    Gets together: Not on file    Attends religious service: Not on file    Active member of club or organization: Not on file    Attends meetings of clubs or organizations: Not on file    Relationship status: Not on file  . Intimate partner violence:    Fear of current or ex partner: Not on file    Emotionally abused: Not on file    Physically abused: Not on file    Forced sexual activity: Not on file  Other Topics Concern  . Not on file  Social History Narrative  . Not on file     PHYSICAL EXAM:  VS: BP (!) 98/54 (BP Location: Left Arm, Patient Position: Sitting, Cuff Size: Normal)   Pulse 99  Ht 5\' 4"  (1.626 m)   SpO2 98%   BMI 20.77 kg/m  Physical Exam Gen: NAD, alert, cooperative with exam, well-appearing ENT: normal lips, normal nasal mucosa,  Eye: normal EOM, normal conjunctiva and lids CV:  no edema, +2 pedal pulses   Resp: no accessory muscle use, non-labored,  Skin: no rashes, no areas of induration  Neuro: normal tone, normal sensation to touch Psych:  normal insight, alert and oriented MSK:  Left foot:  Swelling and ecchymosis on the dorsal aspect of the 5th toe  TTP of the 5th MTP joint  No TTP of the 5th metatarsal  Neurovascularly intact   Limited ultrasound: left foot:  No fracture appreciated in the 5th proximal or middle phalange  No fracture of the 5th metatarsal  Increased hypoechoic change in the 5th MTP joint to suggest an effusion   Summary: no fracture appreciated   Ultrasound and interpretation by Clare Gandy, MD          ASSESSMENT & PLAN:   Pain of toe of left foot There appears to be a contusion of the 5th MTP joint. No fracture appreciated.  - counseled on supportive care  - ice and buddy taping  - if no improvement consider xray or post op shoe

## 2018-02-26 NOTE — Assessment & Plan Note (Signed)
There appears to be a contusion of the 5th MTP joint. No fracture appreciated.  - counseled on supportive care  - ice and buddy taping  - if no improvement consider xray or post op shoe

## 2018-02-26 NOTE — Patient Instructions (Signed)
Good to see you  Please try ice and tylenol  Please try to wear a harder soled shoe to help.

## 2018-03-03 ENCOUNTER — Telehealth: Payer: Self-pay | Admitting: Family Medicine

## 2018-03-03 NOTE — Telephone Encounter (Signed)
Copied from CRM [id number]. Topic: Quick Communication - Rx Refill/Question >> Mar 03, 2018  6:26 PM Zada Girt, [place] L wrote: Medication: hydrochlorothiazide (HYDRODIURIL) 25 MG tablet (patient says pharmacy faxed twice and no response)  Has the patient contacted their pharmacy? Yes.   (Agent: If no, request that the patient contact the pharmacy for the refill.) (Agent: If yes, when and what did the pharmacy advise?)  Preferred Pharmacy (with phone number or street name): [id number] - [place], [place] - [address] [place] [address] Phone: [phone] Fax: [phone]  Agent: Please be advised that RX refills may take up to 3 business days. We ask that you follow-up with your pharmacy.

## 2018-03-04 ENCOUNTER — Other Ambulatory Visit: Payer: Self-pay

## 2018-03-04 MED ORDER — HYDROCHLOROTHIAZIDE 25 MG PO TABS: 25.0000 mg | ORAL_TABLET | Freq: Every day | ORAL | 0 refills | Status: DC

## 2018-03-04 NOTE — Telephone Encounter (Signed)
Rx has been sent to pharmacy

## 2018-03-04 NOTE — Telephone Encounter (Signed)
See note

## 2018-03-12 NOTE — Progress Notes (Signed)
[name] [address] [place], [place] [address] Phone: [phone] Subjective:   Bruce Donath, am serving as a scribe for Dr. Antoine Primas.  I'm seeing this patient by the request  of:    CC: Left foot pain, left shoulder pain follow-up  BJY:NWGNFAOZHY  Rebekah Campbell is a 52 y.o. female coming in with complaint of left foot, 5th toe pain. Hit foot on metal scale 02/24/18. She has sharp pain over the 5th MCP.  Patient was seen by another provider.  Patien at that time was told that she had a sprained toe.  Continues to have pain.  Has been noticed that she can only be in certain shoes.  Swelling has not improved but the bruising has  Has been having pain at night in the left shoulder. Pain is persisting. Is doing exercises and swimming without problems. Pain has not improved since December.  Patient was seen by me in March and had a partial rotator cuff tear but seem to be improving.  Still having some discomfort and pain.  Recently because she has been using her arm more she has noticed more discomfort and pain than usual.     Past Medical History:  Diagnosis Date  . ADHD (attention deficit hyperactivity disorder)   . Depression   . HTN (hypertension)   . Inguinal hernia of right side with obstruction and without gangrene    Past Surgical History:  Procedure Laterality Date  . HERNIA REPAIR    . TONSILLECTOMY     Social History   Socioeconomic History  . Marital status: Single    Spouse name: Not on file  . Number of children: Not on file  . Years of education: Not on file  . Highest education level: Not on file  Occupational History  . Not on file  Social Needs  . Financial resource strain: Not on file  . Food insecurity:    Worry: Not on file    Inability: Not on file  . Transportation needs:    Medical: Not on file    Non-medical: Not on file  Tobacco Use  . Smoking status: Never Smoker  . Smokeless tobacco: Never Used    Substance and Sexual Activity  . Alcohol use: Yes    Alcohol/week: 3.0 - 5.0 standard drinks    Types: 3 - 5 Glasses of wine per week  . Drug use: No  . Sexual activity: Yes  Lifestyle  . Physical activity:    Days per week: Not on file    Minutes per session: Not on file  . Stress: Not on file  Relationships  . Social connections:    Talks on phone: Not on file    Gets together: Not on file    Attends religious service: Not on file    Active member of club or organization: Not on file    Attends meetings of clubs or organizations: Not on file    Relationship status: Not on file  Other Topics Concern  . Not on file  Social History Narrative  . Not on file   Allergies  Allergen Reactions  . Codeine Nausea And Vomiting   Family History  Problem Relation Age of Onset  . Anesthesia problems Neg Hx       Current Outpatient Medications (Cardiovascular):  .  hydrochlorothiazide (HYDRODIURIL) 25 MG tablet, Take 1 tablet (25 mg total) by mouth daily.   Facility-Administered Medications Ordered in Other Visits (Cardiovascular):  .  lidocaine (cardiac) 100 mg/1ml (XYLOCAINE) 20 MG/ML injection 2%    Current Outpatient Medications (Analgesics):  .  acetaminophen (TYLENOL) 500 MG tablet, Take 500 mg by mouth every 6 (six) hours as needed for mild pain.   Facility-Administered Medications Ordered in Other Visits (Analgesics):  .  fentaNYL (SUBLIMAZE) injection .  propofol (DIPRIVAN) 10 mg/mL bolus    Current Outpatient Medications (Other):  .  amphetamine-dextroamphetamine (ADDERALL) 30 MG tablet, Take 15 mg by mouth 2 (two) times daily. .  clonazePAM (KLONOPIN) 0.5 MG tablet, TAKE 1 TABLET BY MOUTH TWICE DAILY AS NEEDED FOR ANXIETY (Patient taking differently: TAKE 0.5mg  BY MOUTH TWICE DAILY AS NEEDED FOR ANXIETY) .  Diclofenac Sodium 2 % SOLN, Place 2 g onto the skin 2 (two) times daily. .  Melatonin 10 MG TABS, Take 3 tablets by mouth at bedtime as needed (for  sleep). .  Vitamin D, Ergocalciferol, (DRISDOL) 50000 units CAPS capsule, Take 1 capsule (50,000 Units total) by mouth every 7 (seven) days.   Facility-Administered Medications Ordered in Other Visits (Other):  .  ondansetron (ZOFRAN) injection No current facility-administered medications for this visit.     Past medical history, social, surgical and family history all reviewed in electronic medical record.  No pertanent information unless stated regarding to the chief complaint.   Review of Systems:  No headache, visual changes, nausea, vomiting, diarrhea, constipation, dizziness, abdominal pain, skin rash, fevers, chills, night sweats, weight loss, swollen lymph nodes, body aches, joint swelling,  chest pain, shortness of breath, mood changes.  Positive muscle aches  Objective  Pulse (!) 104, height 5\' 4"  (1.626 m), weight 122 lb (55.3 kg), SpO2 96 %.    General: No apparent distress alert and oriented x3 mood and affect normal, dressed appropriately.  HEENT: Pupils equal, extraocular movements intact  Respiratory: Patient's speak in full sentences and does not appear short of breath  Cardiovascular: No lower extremity edema, non tender, no erythema  Skin: Warm dry intact with no signs of infection or rash on extremities or on axial skeleton.  Abdomen: Soft nontender  Neuro: Cranial nerves II through XII are intact, neurovascularly intact in all extremities with 2+ DTRs and 2+ pulses.  Lymph: No lymphadenopathy of posterior or anterior cervical chain or axillae bilaterally.  Gait mild antalgic MSK:  Non tender with full range of motion and good stability and symmetric strength and tone of  elbows, wrist, hip, knee and ankles bilaterally.  Shoulder: left Inspection reveals no abnormalities, atrophy or asymmetry. Palpation is normal with no tenderness over AC joint or bicipital groove. ROM is full in all planes passively. Rotator cuff strength normal throughout. signs of impingement  with positive Neer and Hawkin's tests, but negative empty can sign. Speeds and Yergason's tests normal. No labral pathology noted with negative Obrien's, negative clunk and good stability. Normal scapular function observed. No painful arc and no drop arm sign. No apprehension sign Contralateral shoulder unremarkable  Left foot exam of the fifth metatarsal and phalanx shows the patient does have swelling noted.  Tender to palpation at the PIP joint.   Procedure: Real-time Ultrasound Guided Injection of left glenohumeral joint Device: GE Logiq E  Ultrasound guided injection is preferred based studies that show increased duration, increased effect, greater accuracy, decreased procedural pain, increased response rate with ultrasound guided versus blind injection.  Verbal informed consent obtained.  Time-out conducted.  Noted no overlying erythema, induration, or other signs of local infection.  Skin prepped in a sterile fashion.  Local  anesthesia: Topical Ethyl chloride.  With sterile technique and under real time ultrasound guidance:  Joint visualized.  23g 1  inch needle inserted posterior approach. Pictures taken for needle placement. Patient did have injection of 2 cc of 1% lidocaine, 2 cc of 0.5% Marcaine, and 1.0 cc of Kenalog 40 mg/dL. Completed without difficulty  Pain immediately resolved suggesting accurate placement of the medication.  Advised to call if fevers/chills, erythema, induration, drainage, or persistent bleeding.  Images permanently stored and available for review in the ultrasound unit.  Impression: Technically successful ultrasound guided injection.    Impression and Recommendations:     This case required medical decision making of moderate complexity. The above documentation has been reviewed and is accurate and complete Judi SaaZachary M Smith, DO       Note: This dictation was prepared with Dragon dictation along with smaller phrase technology. Any transcriptional  errors that result from this process are unintentional.

## 2018-03-15 ENCOUNTER — Ambulatory Visit: Payer: BLUE CROSS/BLUE SHIELD | Admitting: Family Medicine

## 2018-03-15 ENCOUNTER — Ambulatory Visit: Payer: Self-pay

## 2018-03-15 VITALS — HR 104 | Ht 64.0 in | Wt 122.0 lb

## 2018-03-15 DIAGNOSIS — M24112 Other articular cartilage disorders, left shoulder: Secondary | ICD-10-CM | POA: Diagnosis not present

## 2018-03-15 DIAGNOSIS — M79672 Pain in left foot: Secondary | ICD-10-CM

## 2018-03-15 DIAGNOSIS — S92355A Nondisplaced fracture of fifth metatarsal bone, left foot, initial encounter for closed fracture: Secondary | ICD-10-CM | POA: Diagnosis not present

## 2018-03-15 MED ORDER — VITAMIN D (ERGOCALCIFEROL) 1.25 MG (50000 UNIT) PO CAPS: 50000.0000 [IU] | ORAL_CAPSULE | ORAL | 0 refills | Status: DC

## 2018-03-15 NOTE — Assessment & Plan Note (Signed)
Distal.  Discussed icing regimen and home exercises.  Discussed which activities to do which wants to avoid.  Patient is to increase activity slowly over the course the next several weeks.  Follow-up again in 4 to 6 weeks

## 2018-03-15 NOTE — Assessment & Plan Note (Signed)
Injected today.  Tolerated the procedure well.  Discussed icing regimen.  Has done home exercises and encouraged her to continue to do so.  May need advanced imaging if this continues.  Follow-up again in 4 to 6 weeks

## 2018-03-15 NOTE — Patient Instructions (Addendum)
Good to see you  Ice is your friend Once weekly vitamin D for 12 weeks.  Hiking boots and avoid being barefoot.  Injected the shoulder Continue the shoulder exercises 2-3 times a week starting again in 2 days See me again in 4 weeks

## 2018-04-11 NOTE — Progress Notes (Signed)
[name] [address] [place], [place] [address] Phone: [phone] Subjective:    I Rebekah Campbell am serving as a Neurosurgeon for Dr. Antoine Primas.   CC: Left foot pain follow-up,  BJY:NWGNFAOZHY  Rebekah Campbell is a 52 y.o. female coming in with complaint of left foot pain. Foot is doing ok. Ankle is sore. States that she pulled her hamstring playing tennis. Has been stretching. Sharp shooting pain that shot up to her head.  Onset- 1 week Location- Hamstring, proximal Character-mostly dull . Aggravating factors- rates severity 7/10 Reliving factors-stopping different activities Therapies tried- Heat and ice Severity- 5     Past Medical History:  Diagnosis Date  . ADHD (attention deficit hyperactivity disorder)   . Depression   . HTN (hypertension)   . Inguinal hernia of right side with obstruction and without gangrene    Past Surgical History:  Procedure Laterality Date  . HERNIA REPAIR    . TONSILLECTOMY     Social History   Socioeconomic History  . Marital status: Single    Spouse name: Not on file  . Number of children: Not on file  . Years of education: Not on file  . Highest education level: Not on file  Occupational History  . Not on file  Social Needs  . Financial resource strain: Not on file  . Food insecurity:    Worry: Not on file    Inability: Not on file  . Transportation needs:    Medical: Not on file    Non-medical: Not on file  Tobacco Use  . Smoking status: Never Smoker  . Smokeless tobacco: Never Used  Substance and Sexual Activity  . Alcohol use: Yes    Alcohol/week: 3.0 - 5.0 standard drinks    Types: 3 - 5 Glasses of wine per week  . Drug use: No  . Sexual activity: Yes  Lifestyle  . Physical activity:    Days per week: Not on file    Minutes per session: Not on file  . Stress: Not on file  Relationships  . Social connections:    Talks on phone: Not on file    Gets together: Not on file   Attends religious service: Not on file    Active member of club or organization: Not on file    Attends meetings of clubs or organizations: Not on file    Relationship status: Not on file  Other Topics Concern  . Not on file  Social History Narrative  . Not on file   Allergies  Allergen Reactions  . Codeine Nausea And Vomiting   Family History  Problem Relation Age of Onset  . Anesthesia problems Neg Hx       Current Outpatient Medications (Cardiovascular):  .  hydrochlorothiazide (HYDRODIURIL) 25 MG tablet, Take 1 tablet (25 mg total) by mouth daily.   Facility-Administered Medications Ordered in Other Visits (Cardiovascular):  .  lidocaine (cardiac) 100 mg/54ml (XYLOCAINE) 20 MG/ML injection 2%    Current Outpatient Medications (Analgesics):  .  acetaminophen (TYLENOL) 500 MG tablet, Take 500 mg by mouth every 6 (six) hours as needed for mild pain.   Facility-Administered Medications Ordered in Other Visits (Analgesics):  .  fentaNYL (SUBLIMAZE) injection .  propofol (DIPRIVAN) 10 mg/mL bolus    Current Outpatient Medications (Other):  .  amphetamine-dextroamphetamine (ADDERALL) 30 MG tablet, Take 15 mg by mouth 2 (two) times daily. .  clonazePAM (KLONOPIN) 0.5 MG tablet, TAKE 1 TABLET BY  MOUTH TWICE DAILY AS NEEDED FOR ANXIETY (Patient taking differently: TAKE 0.5mg  BY MOUTH TWICE DAILY AS NEEDED FOR ANXIETY) .  Melatonin 10 MG TABS, Take 3 tablets by mouth at bedtime as needed (for sleep). .  Vitamin D, Ergocalciferol, (DRISDOL) 50000 units CAPS capsule, Take 1 capsule (50,000 Units total) by mouth every 7 (seven) days. .  Diclofenac Sodium 2 % SOLN, Place 2 g onto the skin 2 (two) times daily.   Facility-Administered Medications Ordered in Other Visits (Other):  .  ondansetron (ZOFRAN) injection No current facility-administered medications for this visit.     Past medical history, social, surgical and family history all reviewed in electronic medical record.   No pertanent information unless stated regarding to the chief complaint.   Review of Systems:  No headache, visual changes, nausea, vomiting, diarrhea, constipation, dizziness, abdominal pain, skin rash, fevers, chills, night sweats, weight loss, swollen lymph nodes, body aches, joint swelling,  chest pain, shortness of breath, mood changes.  Positive muscle aches  Objective  Blood pressure 124/90, pulse 92, height 5\' 4"  (1.626 m), weight 122 lb (55.3 kg), SpO2 98 %.   General: No apparent distress alert and oriented x3 mood and affect normal, dressed appropriately.  HEENT: Pupils equal, extraocular movements intact  Respiratory: Patient's speak in full sentences and does not appear short of breath  Cardiovascular: No lower extremity edema, non tender, no erythema  Skin: Warm dry intact with no signs of infection or rash on extremities or on axial skeleton.  Abdomen: Soft nontender  Neuro: Cranial nerves II through XII are intact, neurovascularly intact in all extremities with 2+ DTRs and 2+ pulses.  Lymph: No lymphadenopathy of posterior or anterior cervical chain or axillae bilaterally.  Gait normal with good balance and coordination.  MSK:  Non tender with full range of motion and good stability and symmetric strength and tone of shoulders, elbows, wrist, hip, knee bilaterally.  Left ankle exam still has some changes in the lateral aspect of the ankle.  Patient does have minimal pain over the fifth toe.  Some results of being able to move it is fine.  No pain at the proximal aspect.  Right hamstring does have some tightness.  More tightness over the right gluteal area.  Limited muscular skeletal ultrasound was performed and interpreted by Judi Saa  Patient's lateral aspect of her lateral malleolus does have what appears to be an avulsion fracture still noted.  This seems to be an acute on chronic 1.  Seems different than previous this time.  Patient's pain in the left toe with the  ankle fracture that was seen previously has completely healed at this time.   Impression and Recommendations:     This case required medical decision making of moderate complexity. The above documentation has been reviewed and is accurate and complete Judi Saa, DO       Note: This dictation was prepared with Dragon dictation along with smaller phrase technology. Any transcriptional errors that result from this process are unintentional.

## 2018-04-12 ENCOUNTER — Other Ambulatory Visit: Payer: Self-pay

## 2018-04-12 ENCOUNTER — Ambulatory Visit: Payer: BLUE CROSS/BLUE SHIELD | Admitting: Family Medicine

## 2018-04-12 ENCOUNTER — Encounter: Payer: Self-pay | Admitting: Family Medicine

## 2018-04-12 DIAGNOSIS — M79675 Pain in left toe(s): Secondary | ICD-10-CM

## 2018-04-12 DIAGNOSIS — S92355A Nondisplaced fracture of fifth metatarsal bone, left foot, initial encounter for closed fracture: Secondary | ICD-10-CM

## 2018-04-12 DIAGNOSIS — S76011A Strain of muscle, fascia and tendon of right hip, initial encounter: Secondary | ICD-10-CM | POA: Diagnosis not present

## 2018-04-12 DIAGNOSIS — S82892A Other fracture of left lower leg, initial encounter for closed fracture: Secondary | ICD-10-CM | POA: Diagnosis not present

## 2018-04-12 MED ORDER — DICLOFENAC SODIUM 2 % TD SOLN: 2.0000 g | Freq: Two times a day (BID) | TRANSDERMAL | 3 refills | Status: DC

## 2018-04-12 MED ORDER — VITAMIN D (ERGOCALCIFEROL) 1.25 MG (50000 UNIT) PO CAPS: 50000.0000 [IU] | ORAL_CAPSULE | ORAL | 0 refills | Status: DC

## 2018-04-12 NOTE — Assessment & Plan Note (Signed)
Well-healed.  No significant difference in change at this time.

## 2018-04-12 NOTE — Assessment & Plan Note (Signed)
Concerned that the avulsion may have an acute on chronic injury.  Discussed more than Airc[employer] again.  Discussed icing regimen and home exercises.  Discussed which activities to doing which wants to avoid.  Follow-up again in 4 to 8 weeks

## 2018-04-12 NOTE — Assessment & Plan Note (Signed)
New injury.  New exercises given.  Patient's hamstring seems to be fairly unremarkable.  Discussed icing regimen and home exercises.  Patient has been playing tennis on a more regular basis.  Discussed icing regimen.  Follow-up again in 4 to 6 weeks

## 2018-04-12 NOTE — Assessment & Plan Note (Signed)
Healed.  Will need to continue down with the rigid shoe.  Discussed icing regimen and home exercise.  Follow-up again 4 to 8 weeks

## 2018-04-12 NOTE — Patient Instructions (Signed)
Good to see you  It looks like you are healing.  Continue the conservative therapy  Error on the side of wearing the brace New exercise for the butt  Refilled vitamin D again and the pennsaid See me again in 6 weeks

## 2018-04-24 ENCOUNTER — Other Ambulatory Visit: Payer: Self-pay | Admitting: Family Medicine

## 2018-05-04 ENCOUNTER — Telehealth: Payer: Self-pay

## 2018-05-04 MED ORDER — TIZANIDINE HCL 2 MG PO TABS: 2.0000 mg | ORAL_TABLET | Freq: Every evening | ORAL | 2 refills | Status: DC

## 2018-05-04 NOTE — Addendum Note (Signed)
Addended by: Judi Saa on: 05/04/2018 03:49 PM   Modules accepted: Orders

## 2018-05-04 NOTE — Telephone Encounter (Signed)
Sending to Dr. Katrinka Blazing. Please advise.

## 2018-05-04 NOTE — Telephone Encounter (Signed)
Copied from CRM [id number]. Topic: General - Other >> May 04, 2018 12:34 PM Gaynelle Adu wrote: Reason for CRM: Patient is calling to request a call back from Antoine Primas or his nurse in regards to muscle spasm she is having in her bottom area. She stated she was seen last month and they talked about it at the appt. She wanting to know if she can be called in  muscle relaxer or something to help. She stated if she needs to come in she will. Please advise

## 2018-05-13 ENCOUNTER — Telehealth: Payer: Self-pay | Admitting: Family Medicine

## 2018-05-13 NOTE — Telephone Encounter (Signed)
Copied from CRM [id number]. Topic: General - Other >> May 13, 2018 11:13 AM Percival SpanishKennedy, Cheryl W wrote:  Pt [facility] [place] Sandria [facility]lyLove [facility]  and he RX her amphetamine-dextroamphetamine (ADDERALL) 30 MG tablet   she is asking if Dr Jimmey RalphParker would start RX  this med . She is asking for a call back

## 2018-05-13 NOTE — Telephone Encounter (Signed)
Please advise.  Psychiatry notes available in Care Everywhere.

## 2018-05-14 NOTE — Telephone Encounter (Signed)
Would be ok with it as long is she is on a stable dose.   Would recommend that she come in for an OV to discuss and bring in her medical records from psychiatry.  [name], MD 05/14/2018 12:51 PM

## 2018-05-17 NOTE — Telephone Encounter (Signed)
Patient returned called and scheduled with PCP for 06/07/2018 and will advise previous psychiatry to fax records to [phone], please note when records received.

## 2018-05-17 NOTE — Telephone Encounter (Signed)
Noted  

## 2018-05-17 NOTE — Telephone Encounter (Signed)
Could you call this patient and get her scheduled for an office visit to discuss her symptoms?  Thank you!

## 2018-05-23 NOTE — Progress Notes (Signed)
Tawana ScaleZach Deyona Soza D.O. [facility] [address] [place], [place] [address] Phone: [phone] Subjective:    I'm seeing this patient by the request  of:    CC: Left toe pain  BJY:[id number]:Subjective  Rebekah Campbell is a 52 y.o. female coming in with complaint of left toe pain. Toe is still painful. Wants another look at the ankle.  Patient was on the fifth toe was having significant swelling.  Did have a small fracture.  Patient states improving but did hit it again.  This is caused her to have more discomfort on the lateral ankle as well.  Was having difficulty with healing secondary to low vitamin D.  Patient is concerned that she is going to have worsening symptoms.  Has not been running yet.     Past Medical History:  Diagnosis Date  . ADHD (attention deficit hyperactivity disorder)   . Depression   . HTN (hypertension)   . Inguinal hernia of right side with obstruction and without gangrene    Past Surgical History:  Procedure Laterality Date  . HERNIA REPAIR    . TONSILLECTOMY     Social History   Socioeconomic History  . Marital status: Single    Spouse name: Not on file  . Number of children: Not on file  . Years of education: Not on file  . Highest education level: Not on file  Occupational History  . Not on file  Social Needs  . Financial resource strain: Not on file  . Food insecurity:    Worry: Not on file    Inability: Not on file  . Transportation needs:    Medical: Not on file    Non-medical: Not on file  Tobacco Use  . Smoking status: Never Smoker  . Smokeless tobacco: Never Used  Substance and Sexual Activity  . Alcohol use: Yes    Alcohol/week: 3.0 - 5.0 standard drinks    Types: 3 - 5 Glasses of wine per week  . Drug use: No  . Sexual activity: Yes  Lifestyle  . Physical activity:    Days per week: Not on file    Minutes per session: Not on file  . Stress: Not on file  Relationships  . Social connections:    Talks on phone: Not on file      Gets together: Not on file    Attends religious service: Not on file    Active member of club or organization: Not on file    Attends meetings of clubs or organizations: Not on file    Relationship status: Not on file  Other Topics Concern  . Not on file  Social History Narrative  . Not on file   Allergies  Allergen Reactions  . Codeine Nausea And Vomiting   Family History  Problem Relation Age of Onset  . Anesthesia problems Neg Hx       Current Outpatient Medications (Cardiovascular):  .  hydrochlorothiazide (HYDRODIURIL) 25 MG tablet, TAKE 1 TABLET(25 MG) BY MOUTH DAILY   Facility-Administered Medications Ordered in Other Visits (Cardiovascular):  .  lidocaine (cardiac) 100 mg/575ml (XYLOCAINE) 20 MG/ML injection 2%    Current Outpatient Medications (Analgesics):  .  acetaminophen (TYLENOL) 500 MG tablet, Take 500 mg by mouth every 6 (six) hours as needed for mild pain. Marland Kitchen.  colchicine 0.6 MG tablet, Take 1 tablet (0.6 mg total) by mouth 2 (two) times daily.   Facility-Administered Medications Ordered in Other Visits (Analgesics):  .  fentaNYL (SUBLIMAZE)  injection .  propofol (DIPRIVAN) 10 mg/mL bolus    Current Outpatient Medications (Other):  .  amphetamine-dextroamphetamine (ADDERALL) 30 MG tablet, Take 15 mg by mouth 2 (two) times daily. .  clonazePAM (KLONOPIN) 0.5 MG tablet, TAKE 1 TABLET BY MOUTH TWICE DAILY AS NEEDED FOR ANXIETY (Patient taking differently: TAKE 0.5mg  BY MOUTH TWICE DAILY AS NEEDED FOR ANXIETY) .  Diclofenac Sodium 2 % SOLN, Place 2 g onto the skin 2 (two) times daily. .  Melatonin 10 MG TABS, Take 3 tablets by mouth at bedtime as needed (for sleep). .  Vitamin D, Ergocalciferol, (DRISDOL) 50000 units CAPS capsule, Take 1 capsule (50,000 Units total) by mouth every 7 (seven) days. Marland Kitchen  doxycycline (VIBRA-TABS) 100 MG tablet, Take 1 tablet (100 mg total) by mouth 2 (two) times daily for 7 days.   Facility-Administered Medications Ordered in  Other Visits (Other):  .  ondansetron (ZOFRAN) injection No current facility-administered medications for this visit.     Past medical history, social, surgical and family history all reviewed in electronic medical record.  No pertanent information unless stated regarding to the chief complaint.   Review of Systems:  No headache, visual changes, nausea, vomiting, diarrhea, constipation, dizziness, abdominal pain, skin rash, fevers, chills, night sweats, weight loss, swollen lymph nodes, body aches, joint swelling, muscle aches, chest pain, shortness of breath, mood changes.   Objective  Blood pressure 110/80, pulse (!) 103, height 5\' 4"  (1.626 m), weight 122 lb (55.3 kg), SpO2 98 %.    General: No apparent distress alert and oriented x3 mood and affect normal, dressed appropriately.  HEENT: Pupils equal, extraocular movements intact  Respiratory: Patient's speak in full sentences and does not appear short of breath  Cardiovascular: No lower extremity edema, non tender, no erythema  Skin: Warm dry intact with no signs of infection or rash on extremities or on axial skeleton.  Abdomen: Soft nontender  Neuro: Cranial nerves II through XII are intact, neurovascularly intact in all extremities with 2+ DTRs and 2+ pulses.  Lymph: No lymphadenopathy of posterior or anterior cervical chain or axillae bilaterally.  Gait normal with good balance and coordination.  MSK:  Non tender with full range of motion and good stability and symmetric strength and tone of shoulders, elbows, wrist, hip, knee bilaterally.  Fifth phalanx fracture on the swelling noted.  No pain over the proximal MTP.  Patient's ankle very mild tenderness over the lateral malleolus but improved from previous exam.  Still has some mild discomfort and pain with anterior drawer but no significant loosening.  Limited musculoskeletal ultrasound was performed and interpreted by Judi Saa  Limited ultrasound of patient's metatarsal  phalanx actually shows that patient does have a cortical defect and callus formation noted.  Patient does have swelling that seems to be a reactive synovitis.  Increasing Doppler flow noted.  Impression: Slow healing fifth metatarsal phalanx fracture   Impression and Recommendations:     This case required medical decision making of moderate complexity. The above documentation has been reviewed and is accurate and complete Judi Saa, DO       Note: This dictation was prepared with Dragon dictation along with smaller phrase technology. Any transcriptional errors that result from this process are unintentional.

## 2018-05-24 ENCOUNTER — Encounter: Payer: Self-pay | Admitting: Family Medicine

## 2018-05-24 ENCOUNTER — Ambulatory Visit: Payer: Self-pay

## 2018-05-24 ENCOUNTER — Ambulatory Visit: Payer: BLUE CROSS/BLUE SHIELD | Admitting: Family Medicine

## 2018-05-24 ENCOUNTER — Ambulatory Visit (INDEPENDENT_AMBULATORY_CARE_PROVIDER_SITE_OTHER)
Admission: RE | Admit: 2018-05-24 | Discharge: 2018-05-24 | Disposition: A | Discharge status: Home / Self Care | Payer: BLUE CROSS/BLUE SHIELD | Source: Ambulatory Visit | Attending: Family Medicine | Admitting: Family Medicine

## 2018-05-24 VITALS — BP 110/80 | HR 103 | Ht 64.0 in | Wt 122.0 lb

## 2018-05-24 DIAGNOSIS — M25572 Pain in left ankle and joints of left foot: Principal | ICD-10-CM

## 2018-05-24 DIAGNOSIS — G8929 Other chronic pain: Secondary | ICD-10-CM

## 2018-05-24 DIAGNOSIS — S92355A Nondisplaced fracture of fifth metatarsal bone, left foot, initial encounter for closed fracture: Secondary | ICD-10-CM

## 2018-05-24 MED ORDER — DOXYCYCLINE HYCLATE 100 MG PO TABS: 100.0000 mg | ORAL_TABLET | Freq: Two times a day (BID) | ORAL | 0 refills | Status: AC

## 2018-05-24 MED ORDER — COLCHICINE 0.6 MG PO TABS: 0.6000 mg | ORAL_TABLET | Freq: Two times a day (BID) | ORAL | 2 refills | Status: DC

## 2018-05-24 NOTE — Patient Instructions (Signed)
Good to see you  Ice is your friend We will get xray downstairs COnitnue the vitmain D Try the colchicine 2 times a day for 5 days then write me to tell me how it went  Also doxycycline 2 times a day for 1 week.  Not done with me yet.  See me again in 4-6 weeks

## 2018-05-24 NOTE — Assessment & Plan Note (Addendum)
Patient does have a toe healing but it seems to be slow interval.  Continue the rigid sole shoes.  Discussed icing regimen, home exercise, which activities to continue to avoid.  Continue vitamin D supplementation.  Follow-up again in 4 to 8 weeks  Spent  25 minutes with patient face-to-face and had greater than 50% of counseling including as described above in assessment and plan.

## 2018-06-07 ENCOUNTER — Ambulatory Visit: Payer: BLUE CROSS/BLUE SHIELD | Admitting: Family Medicine

## 2018-06-07 DIAGNOSIS — Z0289 Encounter for other administrative examinations: Secondary | ICD-10-CM

## 2018-06-08 ENCOUNTER — Encounter: Payer: Self-pay | Admitting: Family Medicine

## 2018-06-10 ENCOUNTER — Other Ambulatory Visit: Payer: Self-pay | Admitting: Family Medicine

## 2018-06-10 NOTE — Telephone Encounter (Signed)
Copied from CRM [id number]. Topic: Quick Communication - Rx Refill/Question >> Jun 10, 2018  2:18 PM Lynne LoganHudson, Caryn D wrote: Medication: Vitamin D, Ergocalciferol, (DRISDOL) 50000 units CAPS capsule  Has the patient contacted their pharmacy? Yes.   (Agent: If no, request that the patient contact the pharmacy for the refill.) (Agent: If yes, when and what did the pharmacy advise?)  Preferred Pharmacy (with phone number or street name): [id number] - [place], [place] - [address] [phone] (Phone) [phone] (Fax)    Agent: Please be advised that RX refills may take up to 3 business days. We ask that you follow-up with your pharmacy.

## 2018-06-11 MED ORDER — VITAMIN D (ERGOCALCIFEROL) 1.25 MG (50000 UNIT) PO CAPS: 50000.0000 [IU] | ORAL_CAPSULE | ORAL | 0 refills | Status: DC

## 2018-06-11 NOTE — Telephone Encounter (Signed)
Refill done.  

## 2018-06-18 ENCOUNTER — Encounter: Payer: Self-pay | Admitting: Family Medicine

## 2018-06-18 ENCOUNTER — Ambulatory Visit: Payer: BLUE CROSS/BLUE SHIELD | Admitting: Family Medicine

## 2018-06-18 VITALS — BP 120/84 | HR 104 | Temp 97.7°F | Ht 64.0 in | Wt 122.6 lb

## 2018-06-18 DIAGNOSIS — I10 Essential (primary) hypertension: Secondary | ICD-10-CM

## 2018-06-18 DIAGNOSIS — F900 Attention-deficit hyperactivity disorder, predominantly inattentive type: Secondary | ICD-10-CM

## 2018-06-18 DIAGNOSIS — M79675 Pain in left toe(s): Secondary | ICD-10-CM

## 2018-06-18 MED ORDER — AMPHETAMINE-DEXTROAMPHETAMINE 30 MG PO TABS: 30.0000 mg | ORAL_TABLET | Freq: Two times a day (BID) | ORAL | 0 refills | Status: DC

## 2018-06-18 MED ORDER — HYDROCHLOROTHIAZIDE 25 MG PO TABS: 25.0000 mg | ORAL_TABLET | Freq: Every day | ORAL | 1 refills | Status: DC

## 2018-06-18 NOTE — Assessment & Plan Note (Signed)
At goal.  Continue HCTZ 25 mg daily 

## 2018-06-18 NOTE — Assessment & Plan Note (Addendum)
Database reviewed without red flags.  She has been on a stable dose for several years.  Her Adderall was refilled today for 3 months. Unfortunately, due to insurance changes she will be having to switch PCPs.  Discussed importance of establishing a new PCP or psychiatrist soon to take over this medication.  She voiced understanding.

## 2018-06-18 NOTE — Patient Instructions (Signed)
It was very nice to see you today!  I will send in 3 months of your medications.  Please try using a rigid soled shoe for your foot pain.  Please follow-up with Dr. Katrinka BlazingSmith soon.  Take care, Dr Jimmey RalphParker

## 2018-06-18 NOTE — Assessment & Plan Note (Signed)
Nonhealing fracture based on x-ray from a few weeks ago.  Recommended rigid sole shoe.  She will follow-up with sports medicine.

## 2018-06-18 NOTE — Progress Notes (Signed)
   Subjective:  Rebekah Campbell is a 52 y.o. female who presents today with a chief complaint of ADHD.   HPI:  ADHD, chronic problem, new to provider Several year history.  Previously managed by psychiatry however she would like to have her prescriptions managed by this office.  She has been on Adderall 30 mg twice daily for several years.  Does well at this dose.  Will occasionally take half of the dose.  Symptoms are stable.  No reported side effects.  No palpitations.  No chest pain or shortness of breath.  No weight loss.  No insomnia.  Foot Pain, chronic problem, stable Patient suffered fracture of her left fifth proximal phalanx several months ago.  Recently saw sports medicine about 3 to 4 weeks ago and was told that the fracture had not healed.  Still has some pain to the area.  Worse with walking.  Hypertension, chronic problem, stable Takes HCTZ 25 mg daily.  Tolerating well.  No side effects.  ROS: Per HPI  PMH: She reports that she has never smoked. She has never used smokeless tobacco. She reports current alcohol use of about 3.0 - 5.0 standard drinks of alcohol per week. She reports that she does not use drugs.  Objective:  Physical Exam: BP 120/84 (BP Location: Left Arm, Patient Position: Sitting, Cuff Size: Normal)   Pulse (!) 104   Temp 97.7 F (36.5 C) (Oral)   Ht 5\' 4"  (1.626 m)   Wt 122 lb 9.6 oz (55.6 kg)   SpO2 96%   BMI 21.04 kg/m   Gen: NAD, resting comfortably CV: RRR with no murmurs appreciated Pulm: NWOB, CTAB with no crackles, wheezes, or rhonchi MSK: -Left foot: Left fifth MTP joint erythematous tender to palpation.  Distal cap refill intact.  Assessment/Plan:  Pain of toe of left foot Nonhealing fracture based on x-ray from a few weeks ago.  Recommended rigid sole shoe.  She will follow-up with sports medicine.   Essential hypertension At goal.  Continue HCTZ 25 mg daily.  Attention deficit hyperactivity disorder (ADHD), predominantly  inattentive type Database reviewed without red flags.  She has been on a stable dose for several years.  Her Adderall was refilled today for 3 months. Unfortunately, due to insurance changes she will be having to switch PCPs.  Discussed importance of establishing a new PCP or psychiatrist soon to take over this medication.  She voiced understanding.     [name], MD 06/18/2018 12:33 PM

## 2018-06-27 NOTE — Progress Notes (Deleted)
[name] [address] [place], [place] [address] Phone: [phone] Subjective:    I'm seeing this patient by the request  of:    CC:   [id number]  Rebekah Campbell is a 52 y.o. female coming in with complaint of ***  Onset-  Location Duration-  Character- Aggravating factors- Reliving factors-  Therapies tried-  Severity-     Past Medical History:  Diagnosis Date  . ADHD (attention deficit hyperactivity disorder)   . Depression   . HTN (hypertension)   . Inguinal hernia of right side with obstruction and without gangrene    Past Surgical History:  Procedure Laterality Date  . HERNIA REPAIR    . TONSILLECTOMY     Social History   Socioeconomic History  . Marital status: Single    Spouse name: Not on file  . Number of children: Not on file  . Years of education: Not on file  . Highest education level: Not on file  Occupational History  . Not on file  Social Needs  . Financial resource strain: Not on file  . Food insecurity:    Worry: Not on file    Inability: Not on file  . Transportation needs:    Medical: Not on file    Non-medical: Not on file  Tobacco Use  . Smoking status: Never Smoker  . Smokeless tobacco: Never Used  Substance and Sexual Activity  . Alcohol use: Yes    Alcohol/week: 3.0 - 5.0 standard drinks    Types: 3 - 5 Glasses of wine per week  . Drug use: No  . Sexual activity: Yes  Lifestyle  . Physical activity:    Days per week: Not on file    Minutes per session: Not on file  . Stress: Not on file  Relationships  . Social connections:    Talks on phone: Not on file    Gets together: Not on file    Attends religious service: Not on file    Active member of club or organization: Not on file    Attends meetings of clubs or organizations: Not on file    Relationship status: Not on file  Other Topics Concern  . Not on file  Social History Narrative  . Not on file   Allergies  Allergen  Reactions  . Codeine Nausea And Vomiting   Family History  Problem Relation Age of Onset  . Anesthesia problems Neg Hx       Current Outpatient Medications (Cardiovascular):  .  hydrochlorothiazide (HYDRODIURIL) 25 MG tablet, Take 1 tablet (25 mg total) by mouth daily.   Facility-Administered Medications Ordered in Other Visits (Cardiovascular):  .  lidocaine (cardiac) 100 mg/8ml (XYLOCAINE) 20 MG/ML injection 2%    Current Outpatient Medications (Analgesics):  .  acetaminophen (TYLENOL) 500 MG tablet, Take 500 mg by mouth every 6 (six) hours as needed for mild pain. Marland Kitchen  colchicine 0.6 MG tablet, Take 1 tablet (0.6 mg total) by mouth 2 (two) times daily.   Facility-Administered Medications Ordered in Other Visits (Analgesics):  .  fentaNYL (SUBLIMAZE) injection .  propofol (DIPRIVAN) 10 mg/mL bolus    Current Outpatient Medications (Other):  .  amphetamine-dextroamphetamine (ADDERALL) 30 MG tablet, Take 1 tablet by mouth 2 (two) times daily. Melene Muller ON 08/17/2018] amphetamine-dextroamphetamine (ADDERALL) 30 MG tablet, Take 1 tablet by mouth 2 (two) times daily. Melene Muller ON 07/18/2018] amphetamine-dextroamphetamine (ADDERALL) 30 MG tablet, Take 1 tablet by mouth 2 (  two) times daily. .  clonazePAM (KLONOPIN) 0.5 MG tablet, TAKE 1 TABLET BY MOUTH TWICE DAILY AS NEEDED FOR ANXIETY (Patient taking differently: TAKE 0.5mg BY MOUTH TWICE DAILY AS NEEDED FOR ANXIETY) .  Diclofenac Sodium 2 % SOLN, Place 2 g onto the skin 2 (two) times daily. .  Melatonin 10 MG TABS, Take 3 tablets by mouth at bedtime as needed (for sleep). .  Vitamin D, Ergocalciferol, (DRISDOL) 1.25 MG (50000 UT) CAPS capsule, Take 1 capsule (50,000 Units total) by mouth every 7 (seven) days.   Facility-Administered Medications Ordered in Other Visits (Other):  .  ondansetron (ZOFRAN) injection No current facility-administered medications for this visit.     Past medical history, social, surgical and family  history all reviewed in electronic medical record.  No pertanent information unless stated regarding to the chief complaint.   Review of Systems:  No headache, visual changes, nausea, vomiting, diarrhea, constipation, dizziness, abdominal pain, skin rash, fevers, chills, night sweats, weight loss, swollen lymph nodes, body aches, joint swelling, muscle aches, chest pain, shortness of breath, mood changes.   Objective  There were no vitals taken for this visit. Systems examined below as of    General: No apparent distress alert and oriented x3 mood and affect normal, dressed appropriately.  HEENT: Pupils equal, extraocular movements intact  Respiratory: Patient's speak in full sentences and does not appear short of breath  Cardiovascular: No lower extremity edema, non tender, no erythema  Skin: Warm dry intact with no signs of infection or rash on extremities or on axial skeleton.  Abdomen: Soft nontender  Neuro: Cranial nerves II through XII are intact, neurovascularly intact in all extremities with 2+ DTRs and 2+ pulses.  Lymph: No lymphadenopathy of posterior or anterior cervical chain or axillae bilaterally.  Gait normal with good balance and coordination.  MSK:  Non tender with full range of motion and good stability and symmetric strength and tone of shoulders, elbows, wrist, hip, knee and ankles bilaterally.     Impression and Recommendations:     This case required medical decision making of moderate complexity. The above documentation has been reviewed and is accurate and complete Zachary M Smith, DO       Note: This dictation was prepared with Dragon dictation along with smaller phrase technology. Any transcriptional errors that result from this process are unintentional.         

## 2018-06-29 ENCOUNTER — Ambulatory Visit: Payer: BLUE CROSS/BLUE SHIELD | Admitting: Family Medicine

## 2018-06-30 NOTE — Progress Notes (Deleted)
[name] [address] [place], [place] [address] Phone: [phone] Subjective:    I'm seeing this patient by the request  of:    CC:   [id number]  Rebekah Campbell is a 53 y.o. female coming in with complaint of ***  Onset-  Location Duration-  Character- Aggravating factors- Reliving factors-  Therapies tried-  Severity-     Past Medical History:  Diagnosis Date  . ADHD (attention deficit hyperactivity disorder)   . Depression   . HTN (hypertension)   . Inguinal hernia of right side with obstruction and without gangrene    Past Surgical History:  Procedure Laterality Date  . HERNIA REPAIR    . TONSILLECTOMY     Social History   Socioeconomic History  . Marital status: Single    Spouse name: Not on file  . Number of children: Not on file  . Years of education: Not on file  . Highest education level: Not on file  Occupational History  . Not on file  Social Needs  . Financial resource strain: Not on file  . Food insecurity:    Worry: Not on file    Inability: Not on file  . Transportation needs:    Medical: Not on file    Non-medical: Not on file  Tobacco Use  . Smoking status: Never Smoker  . Smokeless tobacco: Never Used  Substance and Sexual Activity  . Alcohol use: Yes    Alcohol/week: 3.0 - 5.0 standard drinks    Types: 3 - 5 Glasses of wine per week  . Drug use: No  . Sexual activity: Yes  Lifestyle  . Physical activity:    Days per week: Not on file    Minutes per session: Not on file  . Stress: Not on file  Relationships  . Social connections:    Talks on phone: Not on file    Gets together: Not on file    Attends religious service: Not on file    Active member of club or organization: Not on file    Attends meetings of clubs or organizations: Not on file    Relationship status: Not on file  Other Topics Concern  . Not on file  Social History Narrative  . Not on file   Allergies  Allergen  Reactions  . Codeine Nausea And Vomiting   Family History  Problem Relation Age of Onset  . Anesthesia problems Neg Hx       Current Outpatient Medications (Cardiovascular):  .  hydrochlorothiazide (HYDRODIURIL) 25 MG tablet, Take 1 tablet (25 mg total) by mouth daily.   Facility-Administered Medications Ordered in Other Visits (Cardiovascular):  .  lidocaine (cardiac) 100 mg/8ml (XYLOCAINE) 20 MG/ML injection 2%    Current Outpatient Medications (Analgesics):  .  acetaminophen (TYLENOL) 500 MG tablet, Take 500 mg by mouth every 6 (six) hours as needed for mild pain. Marland Kitchen  colchicine 0.6 MG tablet, Take 1 tablet (0.6 mg total) by mouth 2 (two) times daily.   Facility-Administered Medications Ordered in Other Visits (Analgesics):  .  fentaNYL (SUBLIMAZE) injection .  propofol (DIPRIVAN) 10 mg/mL bolus    Current Outpatient Medications (Other):  .  amphetamine-dextroamphetamine (ADDERALL) 30 MG tablet, Take 1 tablet by mouth 2 (two) times daily. Melene Muller ON 08/17/2018] amphetamine-dextroamphetamine (ADDERALL) 30 MG tablet, Take 1 tablet by mouth 2 (two) times daily. Melene Muller ON 07/18/2018] amphetamine-dextroamphetamine (ADDERALL) 30 MG tablet, Take 1 tablet by mouth 2 (  two) times daily. .  clonazePAM (KLONOPIN) 0.5 MG tablet, TAKE 1 TABLET BY MOUTH TWICE DAILY AS NEEDED FOR ANXIETY (Patient taking differently: TAKE 0.5mg  BY MOUTH TWICE DAILY AS NEEDED FOR ANXIETY) .  Diclofenac Sodium 2 % SOLN, Place 2 g onto the skin 2 (two) times daily. .  Melatonin 10 MG TABS, Take 3 tablets by mouth at bedtime as needed (for sleep). .  Vitamin D, Ergocalciferol, (DRISDOL) 1.25 MG (50000 UT) CAPS capsule, Take 1 capsule (50,000 Units total) by mouth every 7 (seven) days.   Facility-Administered Medications Ordered in Other Visits (Other):  .  ondansetron (ZOFRAN) injection No current facility-administered medications for this visit.     Past medical history, social, surgical and family  history all reviewed in electronic medical record.  No pertanent information unless stated regarding to the chief complaint.   Review of Systems:  No headache, visual changes, nausea, vomiting, diarrhea, constipation, dizziness, abdominal pain, skin rash, fevers, chills, night sweats, weight loss, swollen lymph nodes, body aches, joint swelling, muscle aches, chest pain, shortness of breath, mood changes.   Objective  There were no vitals taken for this visit. Systems examined below as of    General: No apparent distress alert and oriented x3 mood and affect normal, dressed appropriately.  HEENT: Pupils equal, extraocular movements intact  Respiratory: Patient's speak in full sentences and does not appear short of breath  Cardiovascular: No lower extremity edema, non tender, no erythema  Skin: Warm dry intact with no signs of infection or rash on extremities or on axial skeleton.  Abdomen: Soft nontender  Neuro: Cranial nerves II through XII are intact, neurovascularly intact in all extremities with 2+ DTRs and 2+ pulses.  Lymph: No lymphadenopathy of posterior or anterior cervical chain or axillae bilaterally.  Gait normal with good balance and coordination.  MSK:  Non tender with full range of motion and good stability and symmetric strength and tone of shoulders, elbows, wrist, hip, knee and ankles bilaterally.     Impression and Recommendations:     This case required medical decision making of moderate complexity. The above documentation has been reviewed and is accurate and complete Judi Saa, DO       Note: This dictation was prepared with Dragon dictation along with smaller phrase technology. Any transcriptional errors that result from this process are unintentional.

## 2018-07-01 ENCOUNTER — Ambulatory Visit: Payer: BLUE CROSS/BLUE SHIELD | Admitting: Family Medicine

## 2018-07-05 ENCOUNTER — Ambulatory Visit: Payer: BLUE CROSS/BLUE SHIELD | Admitting: Family Medicine

## 2018-09-06 ENCOUNTER — Other Ambulatory Visit: Payer: Self-pay

## 2018-09-06 MED ORDER — TIZANIDINE HCL 2 MG PO TABS: 2.0000 mg | ORAL_TABLET | Freq: Every evening | ORAL | 2 refills | Status: AC

## 2018-11-03 ENCOUNTER — Telehealth: Payer: Self-pay | Admitting: Family Medicine

## 2018-11-03 ENCOUNTER — Other Ambulatory Visit: Payer: Self-pay

## 2018-11-03 MED ORDER — HYDROCHLOROTHIAZIDE 25 MG PO TABS: 25.0000 mg | ORAL_TABLET | Freq: Every day | ORAL | 0 refills | Status: DC

## 2018-11-03 NOTE — Telephone Encounter (Signed)
Copied from CRM [id number]. Topic: Appointment Scheduling - Scheduling Inquiry for Clinic >> Nov 02, 2018 11:07 AM Colin Benton wrote: Reason for CRM: Called pt to schedule virtual follow up appt with Dr. Jimmey Ralph. No answer, LVM. >> Nov 02, 2018  5:27 PM Laural Benes, [place] C wrote: Pt called in to update provider. Pt says that she is returning the office call. Pt says that she was advised by her insurance company [employer]) that PCP is no longer in network. Pt would like to know if provider is able to provide one more refill on medications until she transition to a different PCP?    CB: [phone]

## 2018-11-03 NOTE — Telephone Encounter (Signed)
Rx request Adderall and Vitamin D. I sent a 30 day supply of Hydrochlorothiazide.Patient was aware in December to change PCP.

## 2018-11-04 NOTE — Telephone Encounter (Signed)
It has been 6 months since her last visit. Needs OV for adderall refill.  Katina Degree. Jimmey Ralph, MD 11/04/2018 8:33 AM

## 2018-11-04 NOTE — Telephone Encounter (Signed)
Patient returning call to office. Advised that a virtual visit is needed for refills. Patient states that her insurance is now out of network for Dr Jimmey Ralph. States that she was just wanting to know if he could refill the medications for 1 month until she can find a new PCP? Does not wish to pay $350 for an office visit at this time. Please advise.

## 2018-11-04 NOTE — Telephone Encounter (Signed)
Left voice message for patient to call clinic.  

## 2018-11-04 NOTE — Telephone Encounter (Signed)
See note

## 2018-11-05 NOTE — Telephone Encounter (Signed)
Left voice message not able to fill Rx

## 2018-11-05 NOTE — Telephone Encounter (Signed)
Please advise 

## 2018-11-05 NOTE — Telephone Encounter (Signed)
Unfortunately we require visits at least every 6 months for controlled substances refills. I will not be able to refill without a visit.  Katina Degree. Jimmey Ralph, MD 11/05/2018 10:36 AM

## 2018-12-06 ENCOUNTER — Telehealth: Payer: Self-pay

## 2018-12-06 NOTE — Telephone Encounter (Signed)
Left message for patient to call back for virtual visit for gabapentin.

## 2018-12-13 ENCOUNTER — Other Ambulatory Visit: Payer: Self-pay | Admitting: Family Medicine

## 2019-07-21 ENCOUNTER — Encounter: Payer: Self-pay | Admitting: Family Medicine

## 2019-07-21 ENCOUNTER — Other Ambulatory Visit: Payer: Self-pay

## 2019-07-21 ENCOUNTER — Ambulatory Visit (INDEPENDENT_AMBULATORY_CARE_PROVIDER_SITE_OTHER): Payer: 59 | Admitting: Family Medicine

## 2019-07-21 VITALS — BP 124/78 | HR 87 | Temp 97.0°F | Ht 64.0 in | Wt 131.0 lb

## 2019-07-21 DIAGNOSIS — Z13220 Encounter for screening for lipoid disorders: Secondary | ICD-10-CM | POA: Diagnosis not present

## 2019-07-21 DIAGNOSIS — Z0001 Encounter for general adult medical examination with abnormal findings: Secondary | ICD-10-CM

## 2019-07-21 DIAGNOSIS — F900 Attention-deficit hyperactivity disorder, predominantly inattentive type: Secondary | ICD-10-CM

## 2019-07-21 DIAGNOSIS — I10 Essential (primary) hypertension: Secondary | ICD-10-CM

## 2019-07-21 DIAGNOSIS — Z1211 Encounter for screening for malignant neoplasm of colon: Secondary | ICD-10-CM

## 2019-07-21 DIAGNOSIS — Z124 Encounter for screening for malignant neoplasm of cervix: Secondary | ICD-10-CM

## 2019-07-21 MED ORDER — HYDROCHLOROTHIAZIDE 25 MG PO TABS: ORAL_TABLET | ORAL | 3 refills | Status: DC

## 2019-07-21 MED ORDER — AMPHETAMINE-DEXTROAMPHETAMINE 30 MG PO TABS: 30.0000 mg | ORAL_TABLET | Freq: Two times a day (BID) | ORAL | 0 refills | Status: DC

## 2019-07-21 NOTE — Assessment & Plan Note (Signed)
At goal.  Check CBC, C met, TSH.  Continue HCTZ 25 mg daily.

## 2019-07-21 NOTE — Patient Instructions (Signed)
It was very nice to see you today!  I will send your medications.  We will check blood work today.  I will place an order for you get your Cologuard done.  Come back in 1 year for your next checkup with blood work, or sooner if needed.  Take care, Dr Jerline Pain  Please try these tips to maintain a healthy lifestyle:   Eat at least 3 REAL meals and 1-2 snacks per day.  Aim for no more than 5 hours between eating.  If you eat breakfast, please do so within one hour of getting up.    Each meal should contain half fruits/vegetables, one quarter protein, and one quarter carbs (no bigger than a computer mouse)   Cut down on sweet beverages. This includes juice, soda, and sweet tea.     Drink at least 1 glass of water with each meal and aim for at least 8 glasses per day   Exercise at least 150 minutes every week.    Preventive Care 64-67 Years Old, Female Preventive care refers to visits with your health care provider and lifestyle choices that can promote health and wellness. This includes:  A yearly physical exam. This may also be called an annual well check.  Regular dental visits and eye exams.  Immunizations.  Screening for certain conditions.  Healthy lifestyle choices, such as eating a healthy diet, getting regular exercise, not using drugs or products that contain nicotine and tobacco, and limiting alcohol use. What can I expect for my preventive care visit? Physical exam Your health care provider will check your:  Height and weight. This may be used to calculate body mass index (BMI), which tells if you are at a healthy weight.  Heart rate and blood pressure.  Skin for abnormal spots. Counseling Your health care provider may ask you questions about your:  Alcohol, tobacco, and drug use.  Emotional well-being.  Home and relationship well-being.  Sexual activity.  Eating habits.  Work and work Statistician.  Method of birth control.  Menstrual  cycle.  Pregnancy history. What immunizations do I need?  Influenza (flu) vaccine  This is recommended every year. Tetanus, diphtheria, and pertussis (Tdap) vaccine  You may need a Td booster every 10 years. Varicella (chickenpox) vaccine  You may need this if you have not been vaccinated. Zoster (shingles) vaccine  You may need this after age 6. Measles, mumps, and rubella (MMR) vaccine  You may need at least one dose of MMR if you were born in 1957 or later. You may also need a second dose. Pneumococcal conjugate (PCV13) vaccine  You may need this if you have certain conditions and were not previously vaccinated. Pneumococcal polysaccharide (PPSV23) vaccine  You may need one or two doses if you smoke cigarettes or if you have certain conditions. Meningococcal conjugate (MenACWY) vaccine  You may need this if you have certain conditions. Hepatitis A vaccine  You may need this if you have certain conditions or if you travel or work in places where you may be exposed to hepatitis A. Hepatitis B vaccine  You may need this if you have certain conditions or if you travel or work in places where you may be exposed to hepatitis B. Haemophilus influenzae type b (Hib) vaccine  You may need this if you have certain conditions. Human papillomavirus (HPV) vaccine  If recommended by your health care provider, you may need three doses over 6 months. You may receive vaccines as individual doses or as  more than one vaccine together in one shot (combination vaccines). Talk with your health care provider about the risks and benefits of combination vaccines. What tests do I need? Blood tests  Lipid and cholesterol levels. These may be checked every 5 years, or more frequently if you are over 46 years old.  Hepatitis C test.  Hepatitis B test. Screening  Lung cancer screening. You may have this screening every year starting at age 76 if you have a 30-pack-year history of smoking and  currently smoke or have quit within the past 15 years.  Colorectal cancer screening. All adults should have this screening starting at age 31 and continuing until age 83. Your health care provider may recommend screening at age 25 if you are at increased risk. You will have tests every 1-10 years, depending on your results and the type of screening test.  Diabetes screening. This is done by checking your blood sugar (glucose) after you have not eaten for a while (fasting). You may have this done every 1-3 years.  Mammogram. This may be done every 1-2 years. Talk with your health care provider about when you should start having regular mammograms. This may depend on whether you have a family history of breast cancer.  BRCA-related cancer screening. This may be done if you have a family history of breast, ovarian, tubal, or peritoneal cancers.  Pelvic exam and Pap test. This may be done every 3 years starting at age 36. Starting at age 71, this may be done every 5 years if you have a Pap test in combination with an HPV test. Other tests  Sexually transmitted disease (STD) testing.  Bone density scan. This is done to screen for osteoporosis. You may have this scan if you are at high risk for osteoporosis. Follow these instructions at home: Eating and drinking  Eat a diet that includes fresh fruits and vegetables, whole grains, lean protein, and low-fat dairy.  Take vitamin and mineral supplements as recommended by your health care provider.  Do not drink alcohol if: ? Your health care provider tells you not to drink. ? You are pregnant, may be pregnant, or are planning to become pregnant.  If you drink alcohol: ? Limit how much you have to 0-1 drink a day. ? Be aware of how much alcohol is in your drink. In the [place] one drink equals one 12 oz bottle of beer (355 mL), one 5 oz glass of wine (148 mL), or one 1 oz glass of hard liquor (44 mL). Lifestyle  Take daily care of your teeth and  gums.  Stay active. Exercise for at least 30 minutes on 5 or more days each week.  Do not use any products that contain nicotine or tobacco, such as cigarettes, e-cigarettes, and chewing tobacco. If you need help quitting, ask your health care provider.  If you are sexually active, practice safe sex. Use a condom or other form of birth control (contraception) in order to prevent pregnancy and STIs (sexually transmitted infections).  If told by your health care provider, take low-dose aspirin daily starting at age 31. What's next?  Visit your health care provider once a year for a well check visit.  Ask your health care provider how often you should have your eyes and teeth checked.  Stay up to date on all vaccines. This information is not intended to replace advice given to you by your health care provider. Make sure you discuss any questions you have with your health  care provider. Document Revised: 02/25/2018 Document Reviewed: 02/25/2018 Elsevier Patient Education  2020 [employer].

## 2019-07-21 NOTE — Progress Notes (Signed)
Chief Complaint:  Rebekah Campbell is a 54 y.o. female who presents today for her annual comprehensive physical exam.    Assessment/Plan:  Chronic Problems Addressed Today: Essential hypertension At goal.  Check CBC, C met, TSH.  Continue HCTZ 25 mg daily.  Attention deficit hyperactivity disorder (ADHD), predominantly inattentive type Database with no red flags.  She was previously seen her psychiatrist however we will switch her medication management back over to [place] now that we are on her insurance plan.  Will refill Adderall 30 mg twice daily.  Follow-up in 6 months.  Preventative Healthcare: Check CBC, C met, TSH, lipid panel.  Will place order for Cologuard.  Will place referral for cervical cancer screening.  Patient Counseling(The following topics were reviewed and/or handout was given):  -Nutrition: Stressed importance of moderation in sodium/caffeine intake, saturated fat and cholesterol, caloric balance, sufficient intake of fresh fruits, vegetables, and fiber.  -Stressed the importance of regular exercise.   -Substance Abuse: Discussed cessation/primary prevention of tobacco, alcohol, or other drug use; driving or other dangerous activities under the influence; availability of treatment for abuse.   -Injury prevention: Discussed safety belts, safety helmets, smoke detector, smoking near bedding or upholstery.   -Sexuality: Discussed sexually transmitted diseases, partner selection, use of condoms, avoidance of unintended pregnancy and contraceptive alternatives.   -Dental health: Discussed importance of regular tooth brushing, flossing, and dental visits.  -Health maintenance and immunizations reviewed. Please refer to Health maintenance section.  Return to care in 1 year for next preventative visit.     Subjective:  HPI:  She has no acute complaints today.   She has been taking Adderall 30 mg twice daily for ADHD tolerating well.  No reported side effects.  Medications  help with her ability to focus and stay on task at work.  Also takes HCTZ 25 mg daily and tolerating well.  Lifestyle Diet: None.  Exercise: Likes to run.   Depression screen PHQ 2/9 06/18/2018  Decreased Interest 0  Down, Depressed, Hopeless 0  PHQ - 2 Score 0    Health Maintenance Due  Topic Date Due  . COLONOSCOPY  11/17/2014     ROS: Per HPI, otherwise a complete review of systems was negative.   PMH:  The following were reviewed and entered/updated in epic: Past Medical History:  Diagnosis Date  . ADHD (attention deficit hyperactivity disorder)   . Depression   . HTN (hypertension)   . Inguinal hernia of right side with obstruction and without gangrene    Patient Active Problem List   Diagnosis Date Noted  . Muscle strain of right gluteal region 04/12/2018  . Nondisplaced fracture of fifth metatarsal bone, left foot, initial encounter for closed fracture 03/15/2018  . Essential hypertension 11/27/2017  . Low vitamin D level 11/27/2017  . Arthritis of carpometacarpal [facility]) joint of left thumb 10/27/2017  . TFCC (triangular fibrocartilage complex) injury, left, initial encounter 10/02/2017  . Avulsion fracture of ankle, left, closed, initial encounter 10/02/2017  . Degenerative tear of glenoid labrum of left shoulder 08/28/2017  . Inguinal hernia of right side without obstruction or gangrene 09/29/2016  . Elevated AST (SGOT) 07/30/2016  . Attention deficit hyperactivity disorder (ADHD), predominantly inattentive type 01/03/2016  . Recurrent major depressive disorder, in full remission ([place]) 01/03/2016  . Insomnia 09/21/2015   Past Surgical History:  Procedure Laterality Date  . HERNIA REPAIR    . TONSILLECTOMY      Family History  Problem Relation Age of Onset  .  Anesthesia problems Neg Hx     Medications- reviewed and updated Current Outpatient Medications  Medication Sig Dispense Refill  . acetaminophen (TYLENOL) 500 MG tablet Take 500 mg by mouth  every 6 (six) hours as needed for mild pain.    Marland Kitchen amphetamine-dextroamphetamine (ADDERALL) 30 MG tablet Take 1 tablet by mouth 2 (two) times daily. 60 tablet 0  . hydrochlorothiazide (HYDRODIURIL) 25 MG tablet TAKE 1 TABLET(25 MG) BY MOUTH DAILY 90 tablet 3  . clonazePAM (KLONOPIN) 0.5 MG tablet TAKE 1 TABLET BY MOUTH TWICE DAILY AS NEEDED FOR ANXIETY (Patient taking differently: TAKE 0.57m BY MOUTH TWICE DAILY AS NEEDED FOR ANXIETY) 20 tablet 0   No current facility-administered medications for this visit.   Facility-Administered Medications Ordered in Other Visits  Medication Dose Route Frequency Provider Last Rate Last Admin  . fentaNYL (SUBLIMAZE) injection    PRN BLaretta Alstrom CRNA   50 mcg at 10/18/11 0926  . lidocaine (cardiac) 100 mg/565m(XYLOCAINE) 20 MG/ML injection 2%    PRN BeLaretta AlstromCRNA   80 mg at 10/18/11 0920  . ondansetron (ZOFRAN) injection    PRN BeLaretta AlstromCRNA   4 mg at 10/18/11 1527  . propofol (DIPRIVAN) 10 mg/mL bolus    PRN BeLaretta AlstromCRNA   200 mg at 10/18/11 0920    Allergies-reviewed and updated Allergies  Allergen Reactions  . Codeine Nausea And Vomiting    Social History   Socioeconomic History  . Marital status: Single    Spouse name: Not on file  . Number of children: Not on file  . Years of education: Not on file  . Highest education level: Not on file  Occupational History  . Not on file  Tobacco Use  . Smoking status: Never Smoker  . Smokeless tobacco: Never Used  Substance and Sexual Activity  . Alcohol use: Yes    Alcohol/week: 3.0 - 5.0 standard drinks    Types: 3 - 5 Glasses of wine per week  . Drug use: No  . Sexual activity: Yes  Other Topics Concern  . Not on file  Social History Narrative  . Not on file   Social Determinants of Health   Financial Resource Strain:   . Difficulty of Paying Living Expenses: Not on file  Food Insecurity:   . Worried About RuCharity fundraisern the Last Year: Not on  file  . Ran Out of Food in the Last Year: Not on file  Transportation Needs:   . Lack of Transportation (Medical): Not on file  . Lack of Transportation (Non-Medical): Not on file  Physical Activity:   . Days of Exercise per Week: Not on file  . Minutes of Exercise per Session: Not on file  Stress:   . Feeling of Stress : Not on file  Social Connections:   . Frequency of Communication with Friends and Family: Not on file  . Frequency of Social Gatherings with Friends and Family: Not on file  . Attends Religious Services: Not on file  . Active Member of Clubs or Organizations: Not on file  . Attends ClArchivisteetings: Not on file  . Marital Status: Not on file        Objective:  Physical Exam: BP 124/78   Pulse 87   Temp (!) 97 F (36.1 C)   Ht '5\' 4"'  (1.626 m)   Wt 131 lb (59.4 kg)   SpO2 99%   BMI 22.49 kg/m  Body mass index is 22.49 kg/m. Wt Readings from Last 3 Encounters:  07/21/19 131 lb (59.4 kg)  06/18/18 122 lb 9.6 oz (55.6 kg)  05/24/18 122 lb (55.3 kg)   Gen: NAD, resting comfortably HEENT: TMs normal bilaterally. OP clear. No thyromegaly noted.  CV: RRR with no murmurs appreciated Pulm: NWOB, CTAB with no crackles, wheezes, or rhonchi GI: Normal bowel sounds present. Soft, Nontender, Nondistended. MSK: no edema, cyanosis, or clubbing noted Skin: warm, dry Neuro: CN2-12 grossly intact. Strength 5/5 in upper and lower extremities. Reflexes symmetric and intact bilaterally.  Psych: Normal affect and thought content     Jesiah Grismer M. Jerline Pain, MD 07/21/2019 3:38 PM

## 2019-07-21 NOTE — Assessment & Plan Note (Signed)
Database with no red flags.  She was previously seen her psychiatrist however we will switch her medication management back over to [place] now that we are on her insurance plan.  Will refill Adderall 30 mg twice daily.  Follow-up in 6 months.

## 2019-07-22 LAB — COMPREHENSIVE METABOLIC PANEL
ALT: 21 U/L (ref 0–35)
AST: 52 U/L — ABNORMAL HIGH (ref 0–37)
Albumin: 4.5 g/dL (ref 3.5–5.2)
Alkaline Phosphatase: 91 U/L (ref 39–117)
BUN: 13 mg/dL (ref 6–23)
CO2: 29 mEq/L (ref 19–32)
Calcium: 9.6 mg/dL (ref 8.4–10.5)
Chloride: 102 mEq/L (ref 96–112)
Creatinine, Ser: 0.69 mg/dL (ref 0.40–1.20)
GFR: 88.44 mL/min (ref >60.00)
Glucose, Bld: 96 mg/dL (ref 70–99)
Potassium: 4.2 mEq/L (ref 3.5–5.1)
Sodium: 142 mEq/L (ref 135–145)
Total Bilirubin: 0.5 mg/dL (ref 0.2–1.2)
Total Protein: 7 g/dL (ref 6.0–8.3)

## 2019-07-22 LAB — LIPID PANEL
Cholesterol: 220 mg/dL — ABNORMAL HIGH (ref 0–200)
HDL: 129.5 mg/dL (ref >39.00)
LDL Cholesterol: 70 mg/dL (ref 0–99)
NonHDL: 90.89
Total CHOL/HDL Ratio: 2
Triglycerides: 104 mg/dL (ref 0.0–149.0)
VLDL: 20.8 mg/dL (ref 0.0–40.0)

## 2019-07-22 LAB — CBC
HCT: 42.1 % (ref 36.0–46.0)
Hemoglobin: 14.3 g/dL (ref 12.0–15.0)
MCHC: 33.9 g/dL (ref 30.0–36.0)
MCV: 99.8 fl (ref 78.0–100.0)
Platelets: 287 10*3/uL (ref 150.0–400.0)
RBC: 4.22 Mil/uL (ref 3.87–5.11)
RDW: 12.7 % (ref 11.5–15.5)
WBC: 4.7 10*3/uL (ref 4.0–10.5)

## 2019-07-22 LAB — TSH: TSH: 1.83 u[IU]/mL (ref 0.35–4.50)

## 2019-07-25 NOTE — Progress Notes (Signed)
Please inform patient of the following:  Labs are all stable compared to least year. Would like for her to keep up the good work and we can recheck in a year or so.  Rebekah Campbell. Jimmey Ralph, MD 07/25/2019 10:26 AM

## 2019-07-27 ENCOUNTER — Telehealth: Payer: Self-pay | Admitting: Family Medicine

## 2019-07-27 NOTE — Telephone Encounter (Signed)
Patient called in upset because she has not heard from anyone about her lab results. Patient would like for [place] to leave a detailed message on her voicemail if she doesn't answer if everything is fine.

## 2019-07-28 NOTE — Telephone Encounter (Signed)
Left detailed message regarding lab results Per DPR.Informed patient to call clinic if any questions or concerns.  

## 2019-09-02 ENCOUNTER — Telehealth: Payer: Self-pay | Admitting: Family Medicine

## 2019-09-02 NOTE — Telephone Encounter (Signed)
Patient called in this afternoon about a billing issue and also wanted to know if she can have the cologuard sent to her.

## 2019-09-06 NOTE — Telephone Encounter (Signed)
Left detailed message for billing issue and informed cologuard request sent.

## 2019-09-12 ENCOUNTER — Telehealth: Payer: Self-pay | Admitting: Family Medicine

## 2019-09-12 NOTE — Telephone Encounter (Signed)
Patient states she was referred to a gynecologist.  Would like a call back with that contact info.

## 2019-09-12 NOTE — Telephone Encounter (Signed)
Patient is requesting a call back.  States that she was referred for a cologuard test.  Patient would like to know more about insurance coverage.  I have also advised patient to follow up with her insurance in regard.

## 2019-09-12 NOTE — Telephone Encounter (Signed)
Left detailed message stating Cologuard ordered again, and to check with insurance about cost.

## 2019-09-13 ENCOUNTER — Other Ambulatory Visit: Payer: Self-pay

## 2019-09-13 DIAGNOSIS — R87615 Unsatisfactory cytologic smear of cervix: Secondary | ICD-10-CM

## 2019-09-13 NOTE — Telephone Encounter (Signed)
Referral placed left detailed message on voice mail to patient.

## 2019-09-19 ENCOUNTER — Encounter: Payer: Self-pay | Admitting: Obstetrics and Gynecology

## 2019-10-04 ENCOUNTER — Encounter: Payer: Self-pay | Admitting: Family Medicine

## 2019-10-04 ENCOUNTER — Telehealth: Payer: Self-pay | Admitting: *Deleted

## 2019-10-04 LAB — COLOGUARD: Cologuard: NEGATIVE

## 2019-10-04 NOTE — Telephone Encounter (Signed)
Left message to return call to our office at their convenience.  Called patient to give results=Cologuard results Negative

## 2019-10-04 NOTE — Telephone Encounter (Signed)
Patient has called back.  I have given her the result.  Patient understands.

## 2019-10-04 NOTE — Telephone Encounter (Signed)
Patient is requesting a call back to inform her of when her next colonoscopy should be.    She also is requesting a copy of the cologuard test to turn into insurance for wellness credit.

## 2019-10-06 ENCOUNTER — Ambulatory Visit: Payer: 59 | Admitting: Obstetrics and Gynecology

## 2019-10-07 NOTE — Telephone Encounter (Signed)
Noted. Agree with plan.  Rebekah Campbell. Jimmey Ralph, MD 10/07/2019 3:12 PM

## 2019-10-07 NOTE — Telephone Encounter (Signed)
Pt advised to repeat cologuard test in 3 years

## 2019-10-21 ENCOUNTER — Telehealth: Payer: Self-pay | Admitting: Family Medicine

## 2019-10-21 MED ORDER — AMPHETAMINE-DEXTROAMPHETAMINE 30 MG PO TABS: 30.0000 mg | ORAL_TABLET | Freq: Two times a day (BID) | ORAL | 0 refills | Status: DC

## 2019-10-21 NOTE — Telephone Encounter (Signed)
MEDICATION: adderall 30 MG  PHARMACY: [facility] [address] [place] [address] at [facility] [address]  Comments: pt asked for 6 month prescription  **Let patient know to contact pharmacy at the end of the day to make sure medication is ready. **  ** Please notify patient to allow 48-72 hours to process**  **Encourage patient to contact the pharmacy for refills or they can request refills through [facility]**

## 2019-10-21 NOTE — Telephone Encounter (Signed)
Rx Adderall Last refill on 07/21/2019 Last OV 07/21/2019

## 2019-10-21 NOTE — Telephone Encounter (Signed)
Patient notifed

## 2019-11-03 ENCOUNTER — Ambulatory Visit: Payer: 59

## 2019-11-09 ENCOUNTER — Ambulatory Visit: Payer: 59 | Admitting: Obstetrics and Gynecology

## 2019-11-10 ENCOUNTER — Other Ambulatory Visit: Payer: Self-pay

## 2019-11-11 ENCOUNTER — Ambulatory Visit: Payer: 59 | Admitting: Obstetrics and Gynecology

## 2019-12-02 ENCOUNTER — Ambulatory Visit: Payer: 59 | Admitting: Obstetrics and Gynecology

## 2019-12-15 ENCOUNTER — Other Ambulatory Visit: Payer: Self-pay | Admitting: Family Medicine

## 2019-12-15 ENCOUNTER — Telehealth: Payer: Self-pay | Admitting: Family Medicine

## 2019-12-15 MED ORDER — AMPHETAMINE-DEXTROAMPHETAMINE 30 MG PO TABS: 30.0000 mg | ORAL_TABLET | Freq: Two times a day (BID) | ORAL | 0 refills | Status: DC

## 2019-12-15 NOTE — Addendum Note (Signed)
Addended by: Orland Mustard on: 12/15/2019 02:18 PM   Modules accepted: Orders

## 2019-12-15 NOTE — Telephone Encounter (Signed)
I have refilled her adderall for her. I do not see a muscle relaxer in her medications. Dr. Jimmey Ralph will be back next week.   Orland Mustard, MD [place] [facility]

## 2019-12-15 NOTE — Telephone Encounter (Signed)
Reason for Call Request for General Office Information Initial Comment Caller states she is wanting a refill of her muscle relaxer sent to her pharmacy. She has called several times with no response. Additional Comment [facility] [phone]

## 2019-12-15 NOTE — Telephone Encounter (Signed)
Patient requesting Adderall I informed the patient Dr.Parkers is out of the office please advise

## 2019-12-15 NOTE — Telephone Encounter (Signed)
Left voice message for patient to call clinic.  

## 2019-12-16 NOTE — Telephone Encounter (Signed)
Left detailed voice message Rx sent in

## 2020-01-03 ENCOUNTER — Ambulatory Visit: Payer: 59 | Admitting: Family Medicine

## 2020-01-03 NOTE — Progress Notes (Deleted)
[name] [address] [place] [address] Phone: [phone] Subjective:    I'm seeing this patient by the request  of:  Ardith Dark, MD  CC:   [id number]  Rebekah Campbell is a 54 y.o. female coming in with complaint of left ankle pain in 2019.   Onset-  Location Duration-  Character- Aggravating factors- Reliving factors-  Therapies tried-  Severity-     Past Medical History:  Diagnosis Date  . ADHD (attention deficit hyperactivity disorder)   . Depression   . HTN (hypertension)   . Inguinal hernia of right side with obstruction and without gangrene    Past Surgical History:  Procedure Laterality Date  . HERNIA REPAIR    . TONSILLECTOMY     Social History   Socioeconomic History  . Marital status: Single    Spouse name: Not on file  . Number of children: Not on file  . Years of education: Not on file  . Highest education level: Not on file  Occupational History  . Not on file  Tobacco Use  . Smoking status: Never Smoker  . Smokeless tobacco: Never Used  Substance and Sexual Activity  . Alcohol use: Yes    Alcohol/week: 3.0 - 5.0 standard drinks    Types: 3 - 5 Glasses of wine per week  . Drug use: No  . Sexual activity: Yes  Other Topics Concern  . Not on file  Social History Narrative  . Not on file   Social Determinants of Health   Financial Resource Strain:   . Difficulty of Paying Living Expenses:   Food Insecurity:   . Worried About Programme researcher, broadcasting/film/video in the Last Year:   . Barista in the Last Year:   Transportation Needs:   . Freight forwarder (Medical):   Marland Kitchen Lack of Transportation (Non-Medical):   Physical Activity:   . Days of Exercise per Week:   . Minutes of Exercise per Session:   Stress:   . Feeling of Stress :   Social Connections:   . Frequency of Communication with Friends and Family:   . Frequency of Social Gatherings with Friends and Family:   . Attends  Religious Services:   . Active Member of Clubs or Organizations:   . Attends Banker Meetings:   Marland Kitchen Marital Status:    Allergies  Allergen Reactions  . Codeine Nausea And Vomiting   Family History  Problem Relation Age of Onset  . Anesthesia problems Neg Hx       Current Outpatient Medications (Cardiovascular):  .  hydrochlorothiazide (HYDRODIURIL) 25 MG tablet, TAKE 1 TABLET(25 MG) BY MOUTH DAILY   Facility-Administered Medications Ordered in Other Visits (Cardiovascular):  .  lidocaine (cardiac) 100 mg/71ml (XYLOCAINE) 20 MG/ML injection 2%    Current Outpatient Medications (Analgesics):  .  acetaminophen (TYLENOL) 500 MG tablet, Take 500 mg by mouth every 6 (six) hours as needed for mild pain.   Facility-Administered Medications Ordered in Other Visits (Analgesics):  .  fentaNYL (SUBLIMAZE) injection .  propofol (DIPRIVAN) 10 mg/mL bolus    Current Outpatient Medications (Other):  .  amphetamine-dextroamphetamine (ADDERALL) 30 MG tablet, Take 1 tablet by mouth 2 (two) times daily. .  clonazePAM (KLONOPIN) 0.5 MG tablet, TAKE 1 TABLET BY MOUTH TWICE DAILY AS NEEDED FOR ANXIETY (Patient taking differently: TAKE 0.5mg  BY MOUTH TWICE DAILY AS NEEDED FOR ANXIETY)   Facility-Administered Medications Ordered in Other Visits (Other):  .  ondansetron (ZOFRAN) injection No current facility-administered medications for this visit.   Reviewed prior external information including notes and imaging from  primary care provider As well as notes that were available from care everywhere and other healthcare systems.  Past medical history, social, surgical and family history all reviewed in electronic medical record.  No pertanent information unless stated regarding to the chief complaint.   Review of Systems:  No headache, visual changes, nausea, vomiting, diarrhea, constipation, dizziness, abdominal pain, skin rash, fevers, chills, night sweats, weight loss, swollen  lymph nodes, body aches, joint swelling, chest pain, shortness of breath, mood changes. POSITIVE muscle aches  Objective  There were no vitals taken for this visit.   General: No apparent distress alert and oriented x3 mood and affect normal, dressed appropriately.  HEENT: Pupils equal, extraocular movements intact  Respiratory: Patient's speak in full sentences and does not appear short of breath  Cardiovascular: No lower extremity edema, non tender, no erythema  Neuro: Cranial nerves II through XII are intact, neurovascularly intact in all extremities with 2+ DTRs and 2+ pulses.  Gait normal with good balance and coordination.  MSK:  Non tender with full range of motion and good stability and symmetric strength and tone of shoulders, elbows, wrist, hip, knee and ankles bilaterally.     Impression and Recommendations:     The above documentation has been reviewed and is accurate and complete Judi Saa, DO       Note: This dictation was prepared with Dragon dictation along with smaller phrase technology. Any transcriptional errors that result from this process are unintentional.

## 2020-01-25 ENCOUNTER — Ambulatory Visit: Payer: 59 | Admitting: Obstetrics and Gynecology

## 2020-02-01 ENCOUNTER — Ambulatory Visit: Payer: 59 | Admitting: Family Medicine

## 2020-02-01 NOTE — Progress Notes (Deleted)
[name] [address] [place] [address] Phone: [phone] Subjective:    I'm seeing this patient by the request  of:  Ardith Dark, MD  CC:   [id number]  Rebekah Campbell is a 54 y.o. female coming in with complaint of X. Last seen in 2019 for foot pain. Patient states       Past Medical History:  Diagnosis Date  . ADHD (attention deficit hyperactivity disorder)   . Depression   . HTN (hypertension)   . Inguinal hernia of right side with obstruction and without gangrene    Past Surgical History:  Procedure Laterality Date  . HERNIA REPAIR    . TONSILLECTOMY     Social History   Socioeconomic History  . Marital status: Single    Spouse name: Not on file  . Number of children: Not on file  . Years of education: Not on file  . Highest education level: Not on file  Occupational History  . Not on file  Tobacco Use  . Smoking status: Never Smoker  . Smokeless tobacco: Never Used  Substance and Sexual Activity  . Alcohol use: Yes    Alcohol/week: 3.0 - 5.0 standard drinks    Types: 3 - 5 Glasses of wine per week  . Drug use: No  . Sexual activity: Yes  Other Topics Concern  . Not on file  Social History Narrative  . Not on file   Social Determinants of Health   Financial Resource Strain:   . Difficulty of Paying Living Expenses:   Food Insecurity:   . Worried About Programme researcher, broadcasting/film/video in the Last Year:   . Barista in the Last Year:   Transportation Needs:   . Freight forwarder (Medical):   Marland Kitchen Lack of Transportation (Non-Medical):   Physical Activity:   . Days of Exercise per Week:   . Minutes of Exercise per Session:   Stress:   . Feeling of Stress :   Social Connections:   . Frequency of Communication with Friends and Family:   . Frequency of Social Gatherings with Friends and Family:   . Attends Religious Services:   . Active Member of Clubs or Organizations:   . Attends Tax inspector Meetings:   Marland Kitchen Marital Status:    Allergies  Allergen Reactions  . Codeine Nausea And Vomiting   Family History  Problem Relation Age of Onset  . Anesthesia problems Neg Hx       Current Outpatient Medications (Cardiovascular):  .  hydrochlorothiazide (HYDRODIURIL) 25 MG tablet, TAKE 1 TABLET(25 MG) BY MOUTH DAILY   Facility-Administered Medications Ordered in Other Visits (Cardiovascular):  .  lidocaine (cardiac) 100 mg/27ml (XYLOCAINE) 20 MG/ML injection 2%    Current Outpatient Medications (Analgesics):  .  acetaminophen (TYLENOL) 500 MG tablet, Take 500 mg by mouth every 6 (six) hours as needed for mild pain.   Facility-Administered Medications Ordered in Other Visits (Analgesics):  .  fentaNYL (SUBLIMAZE) injection .  propofol (DIPRIVAN) 10 mg/mL bolus    Current Outpatient Medications (Other):  .  amphetamine-dextroamphetamine (ADDERALL) 30 MG tablet, Take 1 tablet by mouth 2 (two) times daily. .  clonazePAM (KLONOPIN) 0.5 MG tablet, TAKE 1 TABLET BY MOUTH TWICE DAILY AS NEEDED FOR ANXIETY (Patient taking differently: TAKE 0.5mg  BY MOUTH TWICE DAILY AS NEEDED FOR ANXIETY)   Facility-Administered Medications Ordered in Other Visits (Other):  .  ondansetron (ZOFRAN) injection No current facility-administered medications  for this visit.   Reviewed prior external information including notes and imaging from  primary care provider As well as notes that were available from care everywhere and other healthcare systems.  Past medical history, social, surgical and family history all reviewed in electronic medical record.  No pertanent information unless stated regarding to the chief complaint.   Review of Systems:  No headache, visual changes, nausea, vomiting, diarrhea, constipation, dizziness, abdominal pain, skin rash, fevers, chills, night sweats, weight loss, swollen lymph nodes, body aches, joint swelling, chest pain, shortness of breath, mood changes.  POSITIVE muscle aches  Objective  There were no vitals taken for this visit.   General: No apparent distress alert and oriented x3 mood and affect normal, dressed appropriately.  HEENT: Pupils equal, extraocular movements intact  Respiratory: Patient's speak in full sentences and does not appear short of breath  Cardiovascular: No lower extremity edema, non tender, no erythema  Neuro: Cranial nerves II through XII are intact, neurovascularly intact in all extremities with 2+ DTRs and 2+ pulses.  Gait normal with good balance and coordination.  MSK:  Non tender with full range of motion and good stability and symmetric strength and tone of shoulders, elbows, wrist, hip, knee and ankles bilaterally.     Impression and Recommendations:     The above documentation has been reviewed and is accurate and complete Judi Saa, DO       Note: This dictation was prepared with Dragon dictation along with smaller phrase technology. Any transcriptional errors that result from this process are unintentional.

## 2020-02-02 ENCOUNTER — Ambulatory Visit: Payer: 59 | Admitting: Family Medicine

## 2020-02-02 NOTE — Progress Notes (Deleted)
[name] [address] [place] [address] Phone: [phone] Subjective:    I'm seeing this patient by the request  of:  Ardith Dark, MD  CC:   [id number]  Rebekah Campbell is a 54 y.o. female coming in with complaint of muscle spasm. Last seen in 2019 for muscle spasm. Patient states       Past Medical History:  Diagnosis Date  . ADHD (attention deficit hyperactivity disorder)   . Depression   . HTN (hypertension)   . Inguinal hernia of right side with obstruction and without gangrene    Past Surgical History:  Procedure Laterality Date  . HERNIA REPAIR    . TONSILLECTOMY     Social History   Socioeconomic History  . Marital status: Single    Spouse name: Not on file  . Number of children: Not on file  . Years of education: Not on file  . Highest education level: Not on file  Occupational History  . Not on file  Tobacco Use  . Smoking status: Never Smoker  . Smokeless tobacco: Never Used  Substance and Sexual Activity  . Alcohol use: Yes    Alcohol/week: 3.0 - 5.0 standard drinks    Types: 3 - 5 Glasses of wine per week  . Drug use: No  . Sexual activity: Yes  Other Topics Concern  . Not on file  Social History Narrative  . Not on file   Social Determinants of Health   Financial Resource Strain:   . Difficulty of Paying Living Expenses:   Food Insecurity:   . Worried About Programme researcher, broadcasting/film/video in the Last Year:   . Barista in the Last Year:   Transportation Needs:   . Freight forwarder (Medical):   Marland Kitchen Lack of Transportation (Non-Medical):   Physical Activity:   . Days of Exercise per Week:   . Minutes of Exercise per Session:   Stress:   . Feeling of Stress :   Social Connections:   . Frequency of Communication with Friends and Family:   . Frequency of Social Gatherings with Friends and Family:   . Attends Religious Services:   . Active Member of Clubs or Organizations:   . Attends  Banker Meetings:   Marland Kitchen Marital Status:    Allergies  Allergen Reactions  . Codeine Nausea And Vomiting   Family History  Problem Relation Age of Onset  . Anesthesia problems Neg Hx       Current Outpatient Medications (Cardiovascular):  .  hydrochlorothiazide (HYDRODIURIL) 25 MG tablet, TAKE 1 TABLET(25 MG) BY MOUTH DAILY   Facility-Administered Medications Ordered in Other Visits (Cardiovascular):  .  lidocaine (cardiac) 100 mg/74ml (XYLOCAINE) 20 MG/ML injection 2%    Current Outpatient Medications (Analgesics):  .  acetaminophen (TYLENOL) 500 MG tablet, Take 500 mg by mouth every 6 (six) hours as needed for mild pain.   Facility-Administered Medications Ordered in Other Visits (Analgesics):  .  fentaNYL (SUBLIMAZE) injection .  propofol (DIPRIVAN) 10 mg/mL bolus    Current Outpatient Medications (Other):  .  amphetamine-dextroamphetamine (ADDERALL) 30 MG tablet, Take 1 tablet by mouth 2 (two) times daily. .  clonazePAM (KLONOPIN) 0.5 MG tablet, TAKE 1 TABLET BY MOUTH TWICE DAILY AS NEEDED FOR ANXIETY (Patient taking differently: TAKE 0.5mg  BY MOUTH TWICE DAILY AS NEEDED FOR ANXIETY)   Facility-Administered Medications Ordered in Other Visits (Other):  .  ondansetron (ZOFRAN) injection No current facility-administered  medications for this visit.   Reviewed prior external information including notes and imaging from  primary care provider As well as notes that were available from care everywhere and other healthcare systems.  Past medical history, social, surgical and family history all reviewed in electronic medical record.  No pertanent information unless stated regarding to the chief complaint.   Review of Systems:  No headache, visual changes, nausea, vomiting, diarrhea, constipation, dizziness, abdominal pain, skin rash, fevers, chills, night sweats, weight loss, swollen lymph nodes, body aches, joint swelling, chest pain, shortness of breath, mood  changes. POSITIVE muscle aches  Objective  There were no vitals taken for this visit.   General: No apparent distress alert and oriented x3 mood and affect normal, dressed appropriately.  HEENT: Pupils equal, extraocular movements intact  Respiratory: Patient's speak in full sentences and does not appear short of breath  Cardiovascular: No lower extremity edema, non tender, no erythema  Neuro: Cranial nerves II through XII are intact, neurovascularly intact in all extremities with 2+ DTRs and 2+ pulses.  Gait normal with good balance and coordination.  MSK:  Non tender with full range of motion and good stability and symmetric strength and tone of shoulders, elbows, wrist, hip, knee and ankles bilaterally.     Impression and Recommendations:     The above documentation has been reviewed and is accurate and complete Wilford Grist       Note: This dictation was prepared with Dragon dictation along with smaller phrase technology. Any transcriptional errors that result from this process are unintentional.

## 2020-02-07 ENCOUNTER — Ambulatory Visit: Payer: 59 | Admitting: Family Medicine

## 2020-02-07 NOTE — Progress Notes (Deleted)
[name] [address] [place] [address] Phone: [phone] Subjective:    I'm seeing this patient by the request  of:  Ardith Dark, MD  CC:   [id number]  Rebekah Campbell is a 54 y.o. female coming in with complaint of ***  Onset-  Location Duration-  Character- Aggravating factors- Reliving factors-  Therapies tried-  Severity-     Past Medical History:  Diagnosis Date  . ADHD (attention deficit hyperactivity disorder)   . Depression   . HTN (hypertension)   . Inguinal hernia of right side with obstruction and without gangrene    Past Surgical History:  Procedure Laterality Date  . HERNIA REPAIR    . TONSILLECTOMY     Social History   Socioeconomic History  . Marital status: Single    Spouse name: Not on file  . Number of children: Not on file  . Years of education: Not on file  . Highest education level: Not on file  Occupational History  . Not on file  Tobacco Use  . Smoking status: Never Smoker  . Smokeless tobacco: Never Used  Substance and Sexual Activity  . Alcohol use: Yes    Alcohol/week: 3.0 - 5.0 standard drinks    Types: 3 - 5 Glasses of wine per week  . Drug use: No  . Sexual activity: Yes  Other Topics Concern  . Not on file  Social History Narrative  . Not on file   Social Determinants of Health   Financial Resource Strain:   . Difficulty of Paying Living Expenses:   Food Insecurity:   . Worried About Programme researcher, broadcasting/film/video in the Last Year:   . Barista in the Last Year:   Transportation Needs:   . Freight forwarder (Medical):   Marland Kitchen Lack of Transportation (Non-Medical):   Physical Activity:   . Days of Exercise per Week:   . Minutes of Exercise per Session:   Stress:   . Feeling of Stress :   Social Connections:   . Frequency of Communication with Friends and Family:   . Frequency of Social Gatherings with Friends and Family:   . Attends Religious Services:   .  Active Member of Clubs or Organizations:   . Attends Banker Meetings:   Marland Kitchen Marital Status:    Allergies  Allergen Reactions  . Codeine Nausea And Vomiting   Family History  Problem Relation Age of Onset  . Anesthesia problems Neg Hx       Current Outpatient Medications (Cardiovascular):  .  hydrochlorothiazide (HYDRODIURIL) 25 MG tablet, TAKE 1 TABLET(25 MG) BY MOUTH DAILY   Facility-Administered Medications Ordered in Other Visits (Cardiovascular):  .  lidocaine (cardiac) 100 mg/92ml (XYLOCAINE) 20 MG/ML injection 2%    Current Outpatient Medications (Analgesics):  .  acetaminophen (TYLENOL) 500 MG tablet, Take 500 mg by mouth every 6 (six) hours as needed for mild pain.   Facility-Administered Medications Ordered in Other Visits (Analgesics):  .  fentaNYL (SUBLIMAZE) injection .  propofol (DIPRIVAN) 10 mg/mL bolus    Current Outpatient Medications (Other):  .  amphetamine-dextroamphetamine (ADDERALL) 30 MG tablet, Take 1 tablet by mouth 2 (two) times daily. .  clonazePAM (KLONOPIN) 0.5 MG tablet, TAKE 1 TABLET BY MOUTH TWICE DAILY AS NEEDED FOR ANXIETY (Patient taking differently: TAKE 0.5mg  BY MOUTH TWICE DAILY AS NEEDED FOR ANXIETY)   Facility-Administered Medications Ordered in Other Visits (Other):  .  ondansetron (ZOFRAN)  injection No current facility-administered medications for this visit.   Reviewed prior external information including notes and imaging from  primary care provider As well as notes that were available from care everywhere and other healthcare systems.  Past medical history, social, surgical and family history all reviewed in electronic medical record.  No pertanent information unless stated regarding to the chief complaint.   Review of Systems:  No headache, visual changes, nausea, vomiting, diarrhea, constipation, dizziness, abdominal pain, skin rash, fevers, chills, night sweats, weight loss, swollen lymph nodes, body aches,  joint swelling, chest pain, shortness of breath, mood changes. POSITIVE muscle aches  Objective  There were no vitals taken for this visit.   General: No apparent distress alert and oriented x3 mood and affect normal, dressed appropriately.  HEENT: Pupils equal, extraocular movements intact  Respiratory: Patient's speak in full sentences and does not appear short of breath  Cardiovascular: No lower extremity edema, non tender, no erythema  Neuro: Cranial nerves II through XII are intact, neurovascularly intact in all extremities with 2+ DTRs and 2+ pulses.  Gait normal with good balance and coordination.  MSK:  Non tender with full range of motion and good stability and symmetric strength and tone of shoulders, elbows, wrist, hip, knee and ankles bilaterally.     Impression and Recommendations:     The above documentation has been reviewed and is accurate and complete Judi Saa, DO       Note: This dictation was prepared with Dragon dictation along with smaller phrase technology. Any transcriptional errors that result from this process are unintentional.

## 2020-02-08 ENCOUNTER — Telehealth: Payer: Self-pay

## 2020-02-08 NOTE — Telephone Encounter (Signed)
..   LAST APPOINTMENT DATE: 12/15/2019   NEXT APPOINTMENT DATE:@Visit  date not found  MEDICATION:amphetamine-dextroamphetamine (ADDERALL) 30 MG tablet    PHARMACY:[id number] - [place], [place] - [address]    Pt states " Tell JoEllen that [employer] says Hello! Oh I just love jo ellen so much!"

## 2020-02-08 NOTE — Telephone Encounter (Signed)
Rx request 

## 2020-02-09 MED ORDER — AMPHETAMINE-DEXTROAMPHETAMINE 30 MG PO TABS: 30.0000 mg | ORAL_TABLET | Freq: Two times a day (BID) | ORAL | 0 refills | Status: DC

## 2020-02-20 ENCOUNTER — Telehealth: Payer: Self-pay

## 2020-02-20 NOTE — Telephone Encounter (Signed)
Pt is requesting a 6 panel drug test put in so she can have it for work. She is also requesting a CMA give her a call about some labs she had done in [place]

## 2020-02-21 NOTE — Telephone Encounter (Signed)
OK to order drug test?

## 2020-02-21 NOTE — Telephone Encounter (Signed)
Ok with me. Please place any necessary orders. 

## 2020-02-22 ENCOUNTER — Other Ambulatory Visit: Payer: Self-pay

## 2020-02-22 DIAGNOSIS — Z0283 Encounter for blood-alcohol and blood-drug test: Secondary | ICD-10-CM

## 2020-02-22 NOTE — Telephone Encounter (Signed)
Order placed

## 2020-03-20 ENCOUNTER — Other Ambulatory Visit: Payer: Self-pay | Admitting: Family Medicine

## 2020-03-20 DIAGNOSIS — Z1231 Encounter for screening mammogram for malignant neoplasm of breast: Secondary | ICD-10-CM

## 2020-03-30 ENCOUNTER — Ambulatory Visit: Payer: 59

## 2020-03-30 ENCOUNTER — Encounter: Payer: Self-pay | Admitting: Family Medicine

## 2020-04-09 ENCOUNTER — Other Ambulatory Visit: Payer: Self-pay

## 2020-04-09 MED ORDER — AMPHETAMINE-DEXTROAMPHETAMINE 30 MG PO TABS: 30.0000 mg | ORAL_TABLET | Freq: Two times a day (BID) | ORAL | 0 refills | Status: DC

## 2020-04-09 NOTE — Telephone Encounter (Signed)
MEDICATION:  amphetamine-dextroamphetamine (ADDERALL) 30 MG tablet  PHARMACY:  [id number] - [place], [place] - [address] Phone:  [phone]  Fax:  [phone]       Comments:   **Let patient know to contact pharmacy at the end of the day to make sure medication is ready. **  ** Please notify patient to allow 48-72 hours to process**  **Encourage patient to contact the pharmacy for refills or they can request refills through MY[facility]**   

## 2020-04-09 NOTE — Telephone Encounter (Signed)
LAST APPOINTMENT DATE: 07/21/2019   NEXT APPOINTMENT DATE: Visit date not found    LAST REFILL: 02/18/2020  QTY: 30

## 2020-04-11 ENCOUNTER — Ambulatory Visit: Payer: 59

## 2020-05-03 ENCOUNTER — Other Ambulatory Visit: Payer: Self-pay | Admitting: Gastroenterology

## 2020-05-03 DIAGNOSIS — R140 Abdominal distension (gaseous): Secondary | ICD-10-CM

## 2020-05-08 ENCOUNTER — Other Ambulatory Visit: Payer: Self-pay

## 2020-05-08 ENCOUNTER — Ambulatory Visit
Admission: RE | Admit: 2020-05-08 | Discharge: 2020-05-08 | Disposition: A | Discharge status: Home / Self Care | Payer: 59 | Source: Ambulatory Visit | Attending: Family Medicine | Admitting: Family Medicine

## 2020-05-08 DIAGNOSIS — Z1231 Encounter for screening mammogram for malignant neoplasm of breast: Secondary | ICD-10-CM

## 2020-05-10 ENCOUNTER — Ambulatory Visit
Admission: RE | Admit: 2020-05-10 | Discharge: 2020-05-10 | Disposition: A | Discharge status: Home / Self Care | Payer: 59 | Source: Ambulatory Visit | Attending: Gastroenterology | Admitting: Gastroenterology

## 2020-05-10 DIAGNOSIS — R140 Abdominal distension (gaseous): Secondary | ICD-10-CM

## 2020-05-23 ENCOUNTER — Ambulatory Visit: Payer: 59 | Admitting: Family Medicine

## 2020-05-23 LAB — HM COLONOSCOPY

## 2020-05-29 ENCOUNTER — Telehealth: Payer: Self-pay

## 2020-05-29 ENCOUNTER — Encounter (HOSPITAL_COMMUNITY): Payer: Self-pay | Admitting: Emergency Medicine

## 2020-05-29 ENCOUNTER — Ambulatory Visit (HOSPITAL_COMMUNITY): Admission: EM | Admit: 2020-05-29 | Discharge: 2020-05-29 | Discharge status: Against Medical Advice | Payer: 59

## 2020-05-29 ENCOUNTER — Other Ambulatory Visit: Payer: Self-pay

## 2020-05-29 NOTE — Telephone Encounter (Signed)
See below

## 2020-05-29 NOTE — Telephone Encounter (Signed)
Patient called back and states she was still having a nose bleed  Nurse Assessment Nurse: Stefano Gaul, RN, Dwana Curd Date/Time (Eastern Time): 05/29/2020 1:57:21 PM Confirm and document reason for call. If symptomatic, describe symptoms. ---Caller states she has been having a nosebleed for over 30 min. has some clots. did not know what to do. Does the patient have any new or worsening symptoms? ---Yes Will a triage be completed? ---Yes Related visit to physician within the last 2 weeks? ---No Does the PT have any chronic conditions? (i.e. diabetes, asthma, this includes High risk factors for pregnancy, etc.) ---No Is this a behavioral health or substance abuse call? ---No Guidelines Guideline Title Affirmed Question Affirmed Notes Nurse Date/Time (Eastern Time) Nosebleed [1] Nosebleed AND [2] bleeding now BUT [3] not using correct technique Stefano Gaul, RN, Vera 05/29/2020 1:59:04 PM Disp. Time Lamount Cohen Time) Disposition Final User 05/29/2020 1:55:53 PM Send to Urgent Queue Cherylynn Ridges 05/29/2020 2:09:58 PM Home Care Yes Stefano Gaul, RN, Clerance Lav Disagree/Comply Comply Caller Understands Yes PLEASE NOTE: All timestamps contained within this report are represented as [place] Standard Time. CONFIDENTIALTY NOTICE: This fax transmission is intended only for the addressee. It contains information that is legally privileged, confidential or otherwise protected from use or disclosure. If you are not the intended recipient, you are strictly prohibited from reviewing, disclosing, copying using or disseminating any of this information or taking any action in reliance on or regarding this information. If you have received this fax in error, please notify us immediately by telephone so that we can arrange for its return to [place]. Phone: [phone], Toll-Free: [phone], Fax: [phone] Page: 2 of 2 Call Id: [id number] PreDisposition Call Doctor Care Advice Given Per Guideline HOME CARE: * You  should be able to treat this at home. REASSURANCE AND EDUCATION - NOSEBLEED: * Nosebleeds are common. * It sounds like a routine nosebleed that we can treat at home. PINCH THE NOSTRILS TO STOP A NOSEBLEED: * First gently blow the nose to clear out any large clots. * LEAN FORWARD: Sit down and lean forward. Reason: Blood makes people choke if they lean backwards. [facility] THE NOSE: Gently squeeze the soft parts of the lower nose (nostrils) together. Use your thumb and your index finger in a pinching manner. Do this for 15 minutes. Use a clock or watch to measure the time. Goal: Apply constant pressure to the bleeding point. * If the bleeding continues after 15 minutes of squeezing, move your point of pressure and repeat again for another 15 minutes. CALL BACK IF: * Nosebleeding lasts longer than 30 minutes with using direct pressure * Lightheadedness or weakness occurs * Nosebleeds become worse * You become worse

## 2020-05-29 NOTE — ED Triage Notes (Signed)
Patient reports nose bleed from right nostril . Bleeding started 2 hours ago.  Reports bleeding has not stopped.  Denies sinus issues recently

## 2020-05-29 NOTE — ED Notes (Signed)
Pt told front desk staff he will leave as the nosebleed stopped.

## 2020-05-29 NOTE — ED Notes (Addendum)
Patient is fidgety, tearful.  Asked patient if something else was going on , adding to anxiety.  Points to gentleman with her and says he keeps telling me to calm down.  Frequent movement of extremities, purposeful.  Continues to cry.  Reassured patient and instructed on proper pinching of nose

## 2020-06-11 ENCOUNTER — Encounter: Payer: Self-pay | Admitting: Family Medicine

## 2020-06-15 ENCOUNTER — Telehealth: Payer: Self-pay

## 2020-06-15 MED ORDER — AMPHETAMINE-DEXTROAMPHETAMINE 30 MG PO TABS: 30.0000 mg | ORAL_TABLET | Freq: Two times a day (BID) | ORAL | 0 refills | Status: DC

## 2020-06-15 NOTE — Telephone Encounter (Signed)
LAST APPOINTMENT DATE: 07/21/2019   NEXT APPOINTMENT DATE: 07/27/2020   Rx Adderall  LAST REFILL: 04/09/2020  QTY: 60

## 2020-06-15 NOTE — Telephone Encounter (Signed)
  LAST APPOINTMENT DATE: 07/21/2019  NEXT APPOINTMENT DATE: 07/27/2020  MEDICATION:amphetamine-dextroamphetamine (ADDERALL) 30 MG tablet  PHARMACY:[id number] - [place], [place] - [address] [place] [address] [place] [address]  COMMENTS: Patient is completely out and is wanting this sent in today.   Please advise

## 2020-06-15 NOTE — Addendum Note (Signed)
Addended by: Ardith Dark on: 06/15/2020 03:46 PM   Modules accepted: Orders

## 2020-07-27 ENCOUNTER — Encounter: Payer: 59 | Admitting: Family Medicine

## 2020-07-27 ENCOUNTER — Telehealth: Payer: Self-pay

## 2020-07-27 MED ORDER — HYDROCHLOROTHIAZIDE 25 MG PO TABS: ORAL_TABLET | ORAL | 3 refills | Status: DC

## 2020-07-27 NOTE — Telephone Encounter (Signed)
Rx sent in

## 2020-07-27 NOTE — Telephone Encounter (Signed)
MEDICATION: hydrochlorothiazide (HYDRODIURIL) 25 MG tablet  PHARMACY:  [id number] - [place], [place] - [address] [place] [address] [place] [address] Phone:  [phone]  Fax:  [phone]       Comments:   **Let patient know to contact pharmacy at the end of the day to make sure medication is ready. **  ** Please notify patient to allow 48-72 hours to process**  **Encourage patient to contact the pharmacy for refills or they can request refills through [facility]**

## 2020-08-14 ENCOUNTER — Telehealth: Payer: Self-pay | Admitting: Internal Medicine

## 2020-08-14 ENCOUNTER — Ambulatory Visit (INDEPENDENT_AMBULATORY_CARE_PROVIDER_SITE_OTHER): Payer: 59 | Admitting: Family Medicine

## 2020-08-14 ENCOUNTER — Encounter: Payer: Self-pay | Admitting: Family Medicine

## 2020-08-14 ENCOUNTER — Telehealth: Payer: Self-pay

## 2020-08-14 ENCOUNTER — Other Ambulatory Visit: Payer: Self-pay

## 2020-08-14 VITALS — BP 142/89 | HR 94 | Temp 98.2°F | Ht 64.0 in | Wt 136.0 lb

## 2020-08-14 DIAGNOSIS — F900 Attention-deficit hyperactivity disorder, predominantly inattentive type: Secondary | ICD-10-CM

## 2020-08-14 DIAGNOSIS — Z0001 Encounter for general adult medical examination with abnormal findings: Secondary | ICD-10-CM

## 2020-08-14 DIAGNOSIS — G4700 Insomnia, unspecified: Secondary | ICD-10-CM | POA: Diagnosis not present

## 2020-08-14 DIAGNOSIS — R7989 Other specified abnormal findings of blood chemistry: Secondary | ICD-10-CM

## 2020-08-14 DIAGNOSIS — R300 Dysuria: Secondary | ICD-10-CM

## 2020-08-14 DIAGNOSIS — R739 Hyperglycemia, unspecified: Secondary | ICD-10-CM | POA: Diagnosis not present

## 2020-08-14 DIAGNOSIS — F419 Anxiety disorder, unspecified: Secondary | ICD-10-CM

## 2020-08-14 DIAGNOSIS — F3342 Major depressive disorder, recurrent, in full remission: Secondary | ICD-10-CM

## 2020-08-14 DIAGNOSIS — I10 Essential (primary) hypertension: Secondary | ICD-10-CM

## 2020-08-14 LAB — LIPID PANEL
Cholesterol: 190 mg/dL (ref 0–200)
HDL: 111.5 mg/dL (ref >39.00)
LDL Cholesterol: 69 mg/dL (ref 0–99)
NonHDL: 78.98
Total CHOL/HDL Ratio: 2
Triglycerides: 49 mg/dL (ref 0.0–149.0)
VLDL: 9.8 mg/dL (ref 0.0–40.0)

## 2020-08-14 LAB — COMPREHENSIVE METABOLIC PANEL
ALT: 17 U/L (ref 0–35)
AST: 27 U/L (ref 0–37)
Albumin: 4.2 g/dL (ref 3.5–5.2)
Alkaline Phosphatase: 106 U/L (ref 39–117)
BUN: 13 mg/dL (ref 6–23)
CO2: 31 mEq/L (ref 19–32)
Calcium: 9.5 mg/dL (ref 8.4–10.5)
Chloride: 95 mEq/L — ABNORMAL LOW (ref 96–112)
Creatinine, Ser: 0.67 mg/dL (ref 0.40–1.20)
GFR: 98.17 mL/min (ref >60.00)
Glucose, Bld: 92 mg/dL (ref 70–99)
Potassium: 3.8 mEq/L (ref 3.5–5.1)
Sodium: 135 mEq/L (ref 135–145)
Total Bilirubin: 0.6 mg/dL (ref 0.2–1.2)
Total Protein: 7.5 g/dL (ref 6.0–8.3)

## 2020-08-14 LAB — HEMOGLOBIN A1C: Hgb A1c MFr Bld: 5.5 % (ref 4.6–6.5)

## 2020-08-14 LAB — CBC
HCT: 37 % (ref 36.0–46.0)
Hemoglobin: 12.8 g/dL (ref 12.0–15.0)
MCHC: 34.5 g/dL (ref 30.0–36.0)
MCV: 98.2 fl (ref 78.0–100.0)
Platelets: 276 10*3/uL (ref 150.0–400.0)
RBC: 3.77 Mil/uL — ABNORMAL LOW (ref 3.87–5.11)
RDW: 12.7 % (ref 11.5–15.5)
WBC: 8.1 10*3/uL (ref 4.0–10.5)

## 2020-08-14 LAB — VITAMIN D 25 HYDROXY (VIT D DEFICIENCY, FRACTURES): VITD: 79.17 ng/mL (ref 30.00–100.00)

## 2020-08-14 LAB — TSH: TSH: 3.12 u[IU]/mL (ref 0.35–4.50)

## 2020-08-14 MED ORDER — AMPHETAMINE-DEXTROAMPHETAMINE 30 MG PO TABS: 30.0000 mg | ORAL_TABLET | Freq: Two times a day (BID) | ORAL | 0 refills | Status: DC

## 2020-08-14 MED ORDER — ESCITALOPRAM OXALATE 10 MG PO TABS: 10.0000 mg | ORAL_TABLET | Freq: Every day | ORAL | 3 refills | Status: DC

## 2020-08-14 MED ORDER — NITROFURANTOIN MONOHYD MACRO 100 MG PO CAPS: 100.0000 mg | ORAL_CAPSULE | Freq: Two times a day (BID) | ORAL | 0 refills | Status: DC

## 2020-08-14 NOTE — Progress Notes (Signed)
Chief Complaint:  Rebekah Campbell is a 55 y.o. female who presents today for her annual comprehensive physical exam.    Assessment/Plan:  New/Acute Problems: Dysuria Check urine culture.  No red flags.  Chronic Problems Addressed Today: Anxiety Much worse.  She already has a therapist.  Discussed treatment options.  We will start Lexapro as this is worked well for her in the past.  She will check in with me in a couple of weeks via MyChart.  Low vitamin D level Check vitamin D level.  Essential hypertension Slightly above goal.  Has been well controlled in the past.  Continue Lexapro 25 mg daily.  Check labs today.  Continue home monitoring goal 140/90 or lower.  Recurrent major depressive disorder, in full remission (HCC) Much worsened.  Will restart Lexapro 10 mg daily and she will check with me in a couple of weeks via MyChart.  Attention deficit hyperactivity disorder (ADHD), predominantly inattentive type She is on Adderall 30 mg twice daily.  We will continue this.  Follow-up in 6 months for refills if needed.   Preventative Healthcare: Check labs.  Up-to-date on vaccines and cancer screening.  Patient Counseling(The following topics were reviewed and/or handout was given):  -Nutrition: Stressed importance of moderation in sodium/caffeine intake, saturated fat and cholesterol, caloric balance, sufficient intake of fresh fruits, vegetables, and fiber.  -Stressed the importance of regular exercise.   -Substance Abuse: Discussed cessation/primary prevention of tobacco, alcohol, or other drug use; driving or other dangerous activities under the influence; availability of treatment for abuse.   -Injury prevention: Discussed safety belts, safety helmets, smoke detector, smoking near bedding or upholstery.   -Sexuality: Discussed sexually transmitted diseases, partner selection, use of condoms, avoidance of unintended pregnancy and contraceptive alternatives.   -Dental health:  Discussed importance of regular tooth brushing, flossing, and dental visits.  -Health maintenance and immunizations reviewed. Please refer to Health maintenance section.  Return to care in 1 year for next preventative visit.     Subjective:  HPI:  She has no acute complaints today.  She has been under much more stress recently.  She works in [employer] which has been difficult during [employer] pandemic.  She has been on medications for anxiety and depression in the past.  She did not tolerate Zoloft or Celexa in the past she has been on Lexapro in the past which is doing well.  She is very hesitant to try any medications but she is interested in starting Lexapro for a short period time.  She already has a therapist.    She is also concerned about possible UTI.  Has been having urinary hesitancy and low back pain for a few days.  No reported fevers or chills.  Lifestyle Diet: Balanced.  Exercise: Goes to gym.   Depression screen PHQ 2/9 06/18/2018  Decreased Interest 0  Down, Depressed, Hopeless 0  PHQ - 2 Score 0    Health Maintenance Due  Topic Date Due  . Hepatitis C Screening  Never done  . COVID-19 Vaccine (1) Never done  . TETANUS/TDAP  Never done     ROS: Per HPI, otherwise a complete review of systems was negative.   PMH:  The following were reviewed and entered/updated in epic: Past Medical History:  Diagnosis Date  . ADHD (attention deficit hyperactivity disorder)   . Depression   . HTN (hypertension)   . Inguinal hernia of right side with obstruction and without gangrene    Patient Active Problem List  Diagnosis Date Noted  . Anxiety 08/14/2020  . Essential hypertension 11/27/2017  . Low vitamin D level 11/27/2017  . Arthritis of carpometacarpal [facility]) joint of left thumb 10/27/2017  . TFCC (triangular fibrocartilage complex) injury, left, initial encounter 10/02/2017  . Degenerative tear of glenoid labrum of left shoulder 08/28/2017  . Inguinal hernia of  right side without obstruction or gangrene 09/29/2016  . Elevated AST (SGOT) 07/30/2016  . Attention deficit hyperactivity disorder (ADHD), predominantly inattentive type 01/03/2016  . Recurrent major depressive disorder, in full remission (HCC) 01/03/2016  . Insomnia 09/21/2015   Past Surgical History:  Procedure Laterality Date  . HERNIA REPAIR    . TONSILLECTOMY      Family History  Problem Relation Age of Onset  . Anesthesia problems Neg Hx     Medications- reviewed and updated Current Outpatient Medications  Medication Sig Dispense Refill  . amphetamine-dextroamphetamine (ADDERALL) 30 MG tablet Take 1 tablet by mouth 2 (two) times daily. 60 tablet 0  . escitalopram (LEXAPRO) 10 MG tablet Take 1 tablet (10 mg total) by mouth daily. 90 tablet 3  . hydrochlorothiazide (HYDRODIURIL) 25 MG tablet TAKE 1 TABLET(25 MG) BY MOUTH DAILY 90 tablet 3   No current facility-administered medications for this visit.   Facility-Administered Medications Ordered in Other Visits  Medication Dose Route Frequency Provider Last Rate Last Admin  . fentaNYL (SUBLIMAZE) injection    PRN Jeani Hawking, CRNA   50 mcg at 10/18/11 0926  . lidocaine (cardiac) 100 mg/48ml (XYLOCAINE) 20 MG/ML injection 2%    PRN Jeani Hawking, CRNA   80 mg at 10/18/11 0920  . ondansetron (ZOFRAN) injection    PRN Jeani Hawking, CRNA   4 mg at 10/18/11 1527  . propofol (DIPRIVAN) 10 mg/mL bolus    PRN Jeani Hawking, CRNA   200 mg at 10/18/11 0920    Allergies-reviewed and updated Allergies  Allergen Reactions  . Codeine Nausea And Vomiting    Social History   Socioeconomic History  . Marital status: Single    Spouse name: Not on file  . Number of children: Not on file  . Years of education: Not on file  . Highest education level: Not on file  Occupational History  . Not on file  Tobacco Use  . Smoking status: Never Smoker  . Smokeless tobacco: Never Used  Substance and Sexual Activity  . Alcohol  use: Yes    Alcohol/week: 3.0 - 5.0 standard drinks    Types: 3 - 5 Glasses of wine per week  . Drug use: No  . Sexual activity: Yes  Other Topics Concern  . Not on file  Social History Narrative  . Not on file   Social Determinants of Health   Financial Resource Strain: Not on file  Food Insecurity: Not on file  Transportation Needs: Not on file  Physical Activity: Not on file  Stress: Not on file  Social Connections: Not on file        Objective:  Physical Exam: BP (!) 142/89   Pulse 94   Temp 98.2 F (36.8 C) (Oral)   Ht 5\' 4"  (1.626 m)   Wt 136 lb (61.7 kg)   SpO2 96%   BMI 23.34 kg/m   Body mass index is 23.34 kg/m. Wt Readings from Last 3 Encounters:  08/14/20 136 lb (61.7 kg)  07/21/19 131 lb (59.4 kg)  06/18/18 122 lb 9.6 oz (55.6 kg)   Gen: NAD, resting comfortably HEENT: TMs normal  bilaterally. OP clear. No thyromegaly noted.  CV: RRR with no murmurs appreciated Pulm: NWOB, CTAB with no crackles, wheezes, or rhonchi GI: Normal bowel sounds present. Soft, Nontender, Nondistended. MSK: no edema, cyanosis, or clubbing noted Skin: warm, dry Neuro: CN2-12 grossly intact. Strength 5/5 in upper and lower extremities. Reflexes symmetric and intact bilaterally.  Psych: Normal affect and thought content     Camary Sosa M. Jimmey Ralph, MD 08/14/2020 12:09 PM

## 2020-08-14 NOTE — Assessment & Plan Note (Signed)
Check vitamin D level 

## 2020-08-14 NOTE — Telephone Encounter (Signed)
Patient called team health upset tonight that her antibiotic for UTI was not called in as expected. Called in macrobid for patient after verification of pharmacy and allergies.

## 2020-08-14 NOTE — Assessment & Plan Note (Addendum)
Much worse.  She already has a therapist.  Discussed treatment options.  We will start Lexapro as this is worked well for her in the past.  She will check in with me in a couple of weeks via MyChart.

## 2020-08-14 NOTE — Telephone Encounter (Signed)
Patient requesting refill Rx Adderall

## 2020-08-14 NOTE — Assessment & Plan Note (Signed)
Slightly above goal.  Has been well controlled in the past.  Continue Lexapro 25 mg daily.  Check labs today.  Continue home monitoring goal 140/90 or lower.

## 2020-08-14 NOTE — Assessment & Plan Note (Signed)
She is on Adderall 30 mg twice daily.  We will continue this.  Follow-up in 6 months for refills if needed.

## 2020-08-14 NOTE — Telephone Encounter (Signed)
MEDICATION:  amphetamine-dextroamphetamine (ADDERALL) 30 MG tablet  PHARMACY:  [id number] - [place], [place] - [address] Phone:  [phone]  Fax:  [phone]       Comments:   **Let patient know to contact pharmacy at the end of the day to make sure medication is ready. **  ** Please notify patient to allow 48-72 hours to process**  **Encourage patient to contact the pharmacy for refills or they can request refills through [facility]**

## 2020-08-14 NOTE — Patient Instructions (Signed)
It was very nice to see you today!  Please start the Lexapro.  See me a message in a few weeks to let me know how you are doing.  We will check blood work today.  Also check urine sample.  Please ask to speak with Tammy or Jeanett Schlein.   I will see back in a year for your next physical. Come back to see me sooner if needed.  Take care, Dr Jerline Pain  Please try these tips to maintain a healthy lifestyle:   Eat at least 3 REAL meals and 1-2 snacks per day.  Aim for no more than 5 hours between eating.  If you eat breakfast, please do so within one hour of getting up.    Each meal should contain half fruits/vegetables, one quarter protein, and one quarter carbs (no bigger than a computer mouse)   Cut down on sweet beverages. This includes juice, soda, and sweet tea.     Drink at least 1 glass of water with each meal and aim for at least 8 glasses per day   Exercise at least 150 minutes every week.    Preventive Care 1-81 Years Old, Female Preventive care refers to lifestyle choices and visits with your health care provider that can promote health and wellness. This includes:  A yearly physical exam. This is also called an annual wellness visit.  Regular dental and eye exams.  Immunizations.  Screening for certain conditions.  Healthy lifestyle choices, such as: ? Eating a healthy diet. ? Getting regular exercise. ? Not using drugs or products that contain nicotine and tobacco. ? Limiting alcohol use. What can I expect for my preventive care visit? Physical exam Your health care provider will check your:  Height and weight. These may be used to calculate your BMI (body mass index). BMI is a measurement that tells if you are at a healthy weight.  Heart rate and blood pressure.  Body temperature.  Skin for abnormal spots. Counseling Your health care provider may ask you questions about your:  Past medical problems.  Family's medical history.  Alcohol, tobacco,  and drug use.  Emotional well-being.  Home life and relationship well-being.  Sexual activity.  Diet, exercise, and sleep habits.  Work and work Statistician.  Access to firearms.  Method of birth control.  Menstrual cycle.  Pregnancy history. What immunizations do I need? Vaccines are usually given at various ages, according to a schedule. Your health care provider will recommend vaccines for you based on your age, medical history, and lifestyle or other factors, such as travel or where you work.   What tests do I need? Blood tests  Lipid and cholesterol levels. These may be checked every 5 years, or more often if you are over 43 years old.  Hepatitis C test.  Hepatitis B test. Screening  Lung cancer screening. You may have this screening every year starting at age 70 if you have a 30-pack-year history of smoking and currently smoke or have quit within the past 15 years.  Colorectal cancer screening. ? All adults should have this screening starting at age 52 and continuing until age 65. ? Your health care provider may recommend screening at age 49 if you are at increased risk. ? You will have tests every 1-10 years, depending on your results and the type of screening test.  Diabetes screening. ? This is done by checking your blood sugar (glucose) after you have not eaten for a while (fasting). ? You may have  this done every 1-3 years.  Mammogram. ? This may be done every 1-2 years. ? Talk with your health care provider about when you should start having regular mammograms. This may depend on whether you have a family history of breast cancer.  BRCA-related cancer screening. This may be done if you have a family history of breast, ovarian, tubal, or peritoneal cancers.  Pelvic exam and Pap test. ? This may be done every 3 years starting at age 32. ? Starting at age 29, this may be done every 5 years if you have a Pap test in combination with an HPV test. Other  tests  STD (sexually transmitted disease) testing, if you are at risk.  Bone density scan. This is done to screen for osteoporosis. You may have this scan if you are at high risk for osteoporosis. Talk with your health care provider about your test results, treatment options, and if necessary, the need for more tests. Follow these instructions at home: Eating and drinking  Eat a diet that includes fresh fruits and vegetables, whole grains, lean protein, and low-fat dairy products.  Take vitamin and mineral supplements as recommended by your health care provider.  Do not drink alcohol if: ? Your health care provider tells you not to drink. ? You are pregnant, may be pregnant, or are planning to become pregnant.  If you drink alcohol: ? Limit how much you have to 0-1 drink a day. ? Be aware of how much alcohol is in your drink. In the [place] one drink equals one 12 oz bottle of beer (355 mL), one 5 oz glass of wine (148 mL), or one 1 oz glass of hard liquor (44 mL).   Lifestyle  Take daily care of your teeth and gums. Brush your teeth every morning and night with fluoride toothpaste. Floss one time each day.  Stay active. Exercise for at least 30 minutes 5 or more days each week.  Do not use any products that contain nicotine or tobacco, such as cigarettes, e-cigarettes, and chewing tobacco. If you need help quitting, ask your health care provider.  Do not use drugs.  If you are sexually active, practice safe sex. Use a condom or other form of protection to prevent STIs (sexually transmitted infections).  If you do not wish to become pregnant, use a form of birth control. If you plan to become pregnant, see your health care provider for a prepregnancy visit.  If told by your health care provider, take low-dose aspirin daily starting at age 40.  Find healthy ways to cope with stress, such as: ? Meditation, yoga, or listening to music. ? Journaling. ? Talking to a trusted  person. ? Spending time with friends and family. Safety  Always wear your seat belt while driving or riding in a vehicle.  Do not drive: ? If you have been drinking alcohol. Do not ride with someone who has been drinking. ? When you are tired or distracted. ? While texting.  Wear a helmet and other protective equipment during sports activities.  If you have firearms in your house, make sure you follow all gun safety procedures. What's next?  Visit your health care provider once a year for an annual wellness visit.  Ask your health care provider how often you should have your eyes and teeth checked.  Stay up to date on all vaccines. This information is not intended to replace advice given to you by your health care provider. Make sure you discuss any  questions you have with your health care provider. Document Revised: 03/20/2020 Document Reviewed: 02/25/2018 Elsevier Patient Education  2021 [employer].

## 2020-08-14 NOTE — Assessment & Plan Note (Signed)
Much worsened.  Will restart Lexapro 10 mg daily and she will check with me in a couple of weeks via MyChart.

## 2020-08-15 ENCOUNTER — Telehealth: Payer: Self-pay

## 2020-08-15 LAB — URINE CULTURE
MICRO NUMBER:: 11536093
SPECIMEN QUALITY:: ADEQUATE

## 2020-08-15 NOTE — Telephone Encounter (Signed)
Patient called angry about using antibiotic and not helping confused why she is taking antibiotic if her culture not back yet. Also complain of low back pain requesting Rx probiotic. Patient was told to take tylenol for back pain and heating pad. If not want to take antibiotic she can stop and will discuss with PCP tomorrow patient verbalized understanding

## 2020-08-15 NOTE — Telephone Encounter (Signed)
Fyi.

## 2020-08-15 NOTE — Telephone Encounter (Signed)
Chief Complaint Urination Pain Reason for Call Symptomatic / Request for Health Information Initial Comment Caller states she needs an antibiotic called in. She was in the office today. The prescription is for a UTI. Translation No No Triage Reason Patient declined Nurse Assessment Nurse: Tresa Endo, RN, Kim Date/Time Lamount Cohen Time): 08/14/2020 5:56:28 PM Confirm and document reason for call. If symptomatic, describe symptoms. ---Caller states she was in the office today for a u/a; antibiotic was supposed to sent to [facility] but it's not there. States her current pain level is 10/10. Does the patient have any new or worsening symptoms? ---Yes Will a triage be completed? ---No Select reason for no triage. ---Patient declined Nurse: Tresa Endo, RN, Kim Date/Time (Eastern Time): 08/14/2020 6:00:24 PM Please select the assessment type ---RX called in but not at pharm Additional Documentation ---Caller verbalizes much frustration and dissatisfaction the she was seen in the office today and her antibiotic for her UTI has not been sent to pharmacy. States she was up writhing in pain all night last night, currently has severe pain and demands that her antibiotic be sent in now. Document the name of the medication. ---caller doesn't know Pharmacy name and phone number. ---[facility] [phone] Has the office closed within the last 30 minutes? ---No Does the client directives allow for assistance with medications after hours? ---Yes Is there an on-call physician for the client? ---Yes PLEASE NOTE: All timestamps contained within this report are represented as [place] Standard Time. CONFIDENTIALTY NOTICE: This fax transmission is intended only for the addressee. It contains information that is legally privileged, confidential or otherwise protected from use or disclosure. If you are not the intended recipient, you are strictly prohibited from reviewing, disclosing, copying using or disseminating any of  this information or taking any action in reliance on or regarding this information. If you have received this fax in error, please notify us immediately by telephone so that we can arrange for its return to [place]. Phone: [phone], Toll-Free: [phone], Fax: [phone] Page: 2 of 2 Call Id: [id number] Nurse Assessment Additional Documentation ---Nurse called [facility], spoke with pharmacy and verified that script for antibiotic has not been received. Disp. Time Lamount Cohen Time) Disposition Final User 08/14/2020 6:07:18 PM Called On-Call Provider Tresa Endo, RN, Kim 08/14/2020 6:10:18 PM Clinical Call Yes Tresa Endo, RN, Kim Comments User: Lovenia Shuck, RN Date/Time Lamount Cohen Time): 08/14/2020 6:09:56 PM Caller informed that on call provider will be sending in antibiotic within next 5 minutes. Paging DoctorName Phone DateTime Result/Outcome Message Type Notes Hillard Danker- MD [phone] 08/14/2020 6:07:18 PM Called On Call Provider - Reached Doctor Paged Hillard Danker- MD 08/14/2020 6:07:38 PM Spoke with On Call - General Message Result MD will send antibiotic to [facility] within next few minutes

## 2020-08-16 NOTE — Telephone Encounter (Signed)
We didn't send in an antibiotic. Her culture is negative. She does not need one.  Looks like she called the afterhours triage line demanding one after our visit and it was send in byt the on call doctor.   She can take over the counter probiotics if needed. She does not have a UTI.  We can place a referral for PT or sports medicine for her back if she wishes.  Katina Degree. Jimmey Ralph, MD 08/16/2020 8:12 AM

## 2020-08-16 NOTE — Progress Notes (Signed)
Please inform patient of the following:  Labs are all normal. Urine culture is negative. She does not have a UTI and does not need antibiotics. She can follow up with PT or sports medicine if her symptoms are not improving.  Katina Degree. Jimmey Ralph, MD 08/16/2020 9:10 AM

## 2020-08-17 ENCOUNTER — Telehealth: Payer: Self-pay

## 2020-08-17 NOTE — Telephone Encounter (Signed)
Pt did not like taking her Lexapro. It made her nauseas and feel weird. Pt states she is going to throw it in the trash and wants it removed from her chart. Pt states she wants all traces of anything with depression removed from her chart. I let pt know we are unable to remove office notes that mention depression, but we could remove the antidepressant off her active medication list.

## 2020-08-17 NOTE — Telephone Encounter (Signed)
I cant commit fraud and alter her note.  Where are we with the dismissal process? This is the patient that made racist comments and other rude comments to our staff this week.  Thanks.   Katina Degree. Jimmey Ralph, MD 08/17/2020 3:47 PM

## 2020-08-17 NOTE — Telephone Encounter (Signed)
See note

## 2020-08-21 ENCOUNTER — Other Ambulatory Visit: Payer: 59

## 2020-09-29 ENCOUNTER — Other Ambulatory Visit: Payer: Self-pay

## 2020-09-29 ENCOUNTER — Inpatient Hospital Stay (HOSPITAL_COMMUNITY)
Admission: EM | Admit: 2020-09-29 | Discharge: 2020-10-02 | DRG: 464 | Disposition: A | Discharge status: Home / Self Care | Payer: 59 | Attending: Student | Admitting: Student

## 2020-09-29 ENCOUNTER — Emergency Department (HOSPITAL_COMMUNITY): Payer: 59

## 2020-09-29 ENCOUNTER — Encounter (HOSPITAL_COMMUNITY): Payer: Self-pay

## 2020-09-29 DIAGNOSIS — M898X9 Other specified disorders of bone, unspecified site: Secondary | ICD-10-CM | POA: Diagnosis present

## 2020-09-29 DIAGNOSIS — S51012A Laceration without foreign body of left elbow, initial encounter: Secondary | ICD-10-CM | POA: Diagnosis present

## 2020-09-29 DIAGNOSIS — F909 Attention-deficit hyperactivity disorder, unspecified type: Secondary | ICD-10-CM | POA: Diagnosis present

## 2020-09-29 DIAGNOSIS — S42362B Displaced segmental fracture of shaft of humerus, left arm, initial encounter for open fracture: Secondary | ICD-10-CM | POA: Diagnosis present

## 2020-09-29 DIAGNOSIS — S42322B Displaced transverse fracture of shaft of humerus, left arm, initial encounter for open fracture: Principal | ICD-10-CM | POA: Diagnosis present

## 2020-09-29 DIAGNOSIS — S42492B Other displaced fracture of lower end of left humerus, initial encounter for open fracture: Secondary | ICD-10-CM | POA: Diagnosis present

## 2020-09-29 DIAGNOSIS — Z87898 Personal history of other specified conditions: Secondary | ICD-10-CM

## 2020-09-29 DIAGNOSIS — S61219A Laceration without foreign body of unspecified finger without damage to nail, initial encounter: Secondary | ICD-10-CM

## 2020-09-29 DIAGNOSIS — Z419 Encounter for procedure for purposes other than remedying health state, unspecified: Secondary | ICD-10-CM

## 2020-09-29 DIAGNOSIS — Y9355 Activity, bike riding: Secondary | ICD-10-CM

## 2020-09-29 DIAGNOSIS — D62 Acute posthemorrhagic anemia: Secondary | ICD-10-CM | POA: Diagnosis present

## 2020-09-29 DIAGNOSIS — S42402B Unspecified fracture of lower end of left humerus, initial encounter for open fracture: Secondary | ICD-10-CM | POA: Diagnosis present

## 2020-09-29 DIAGNOSIS — Z20822 Contact with and (suspected) exposure to covid-19: Secondary | ICD-10-CM | POA: Diagnosis present

## 2020-09-29 DIAGNOSIS — Z885 Allergy status to narcotic agent status: Secondary | ICD-10-CM

## 2020-09-29 DIAGNOSIS — S6992XA Unspecified injury of left wrist, hand and finger(s), initial encounter: Secondary | ICD-10-CM | POA: Diagnosis present

## 2020-09-29 DIAGNOSIS — S61214A Laceration without foreign body of right ring finger without damage to nail, initial encounter: Secondary | ICD-10-CM | POA: Diagnosis present

## 2020-09-29 DIAGNOSIS — I10 Essential (primary) hypertension: Secondary | ICD-10-CM | POA: Diagnosis present

## 2020-09-29 DIAGNOSIS — T148XXA Other injury of unspecified body region, initial encounter: Secondary | ICD-10-CM

## 2020-09-29 DIAGNOSIS — S6990XA Unspecified injury of unspecified wrist, hand and finger(s), initial encounter: Secondary | ICD-10-CM

## 2020-09-29 LAB — PROTIME-INR
INR: 1 (ref 0.8–1.2)
Prothrombin Time: 12.3 seconds (ref 11.4–15.2)

## 2020-09-29 LAB — BASIC METABOLIC PANEL
Anion gap: 11 (ref 5–15)
BUN: 14 mg/dL (ref 6–20)
CO2: 25 mmol/L (ref 22–32)
Calcium: 8.5 mg/dL — ABNORMAL LOW (ref 8.9–10.3)
Chloride: 103 mmol/L (ref 98–111)
Creatinine, Ser: 0.56 mg/dL (ref 0.44–1.00)
GFR, Estimated: >60 mL/min (ref >60)
Glucose, Bld: 105 mg/dL — ABNORMAL HIGH (ref 70–99)
Potassium: 3.5 mmol/L (ref 3.5–5.1)
Sodium: 139 mmol/L (ref 135–145)

## 2020-09-29 LAB — CBC WITH DIFFERENTIAL/PLATELET
Abs Immature Granulocytes: 0.05 10*3/uL (ref 0.00–0.07)
Basophils Absolute: 0.1 10*3/uL (ref 0.0–0.1)
Basophils Relative: 1 %
Eosinophils Absolute: 0 10*3/uL (ref 0.0–0.5)
Eosinophils Relative: 0 %
HCT: 35.2 % — ABNORMAL LOW (ref 36.0–46.0)
Hemoglobin: 11.9 g/dL — ABNORMAL LOW (ref 12.0–15.0)
Immature Granulocytes: 0 %
Lymphocytes Relative: 11 %
Lymphs Abs: 1.3 10*3/uL (ref 0.7–4.0)
MCH: 33.2 pg (ref 26.0–34.0)
MCHC: 33.8 g/dL (ref 30.0–36.0)
MCV: 98.3 fL (ref 80.0–100.0)
Monocytes Absolute: 1.2 10*3/uL — ABNORMAL HIGH (ref 0.1–1.0)
Monocytes Relative: 10 %
Neutro Abs: 9.6 10*3/uL — ABNORMAL HIGH (ref 1.7–7.7)
Neutrophils Relative %: 78 %
Platelets: 206 10*3/uL (ref 150–400)
RBC: 3.58 MIL/uL — ABNORMAL LOW (ref 3.87–5.11)
RDW: 12.9 % (ref 11.5–15.5)
WBC: 12.2 10*3/uL — ABNORMAL HIGH (ref 4.0–10.5)
nRBC: 0 % (ref 0.0–0.2)

## 2020-09-29 MED ORDER — ACETAMINOPHEN 325 MG PO TABS: 325.0000 mg | ORAL_TABLET | Freq: Four times a day (QID) | ORAL | Status: DC | PRN

## 2020-09-29 MED ORDER — HYDROCODONE-ACETAMINOPHEN 5-325 MG PO TABS: 1.0000 | ORAL_TABLET | ORAL | Status: DC | PRN

## 2020-09-29 MED ORDER — MORPHINE SULFATE (PF) 4 MG/ML IV SOLN
4.0000 mg | Freq: Once | INTRAVENOUS | Status: AC
  Administered 2020-09-29: 4 mg via INTRAVENOUS
  Filled 2020-09-29: qty 1

## 2020-09-29 MED ORDER — ONDANSETRON HCL 4 MG PO TABS
4.0000 mg | ORAL_TABLET | Freq: Four times a day (QID) | ORAL | Status: DC | PRN
  Administered 2020-10-02 (×2): 4 mg via ORAL
  Filled 2020-09-29 (×2): qty 1

## 2020-09-29 MED ORDER — SODIUM CHLORIDE 0.9 % IV SOLN
INTRAVENOUS | Status: DC
  Administered 2020-09-30: 950 mL via INTRAVENOUS

## 2020-09-29 MED ORDER — HYDROMORPHONE HCL 1 MG/ML IJ SOLN
0.5000 mg | INTRAMUSCULAR | Status: AC | PRN
  Administered 2020-09-29 – 2020-09-30 (×2): 0.5 mg via INTRAVENOUS
  Filled 2020-09-29 (×2): qty 1

## 2020-09-29 MED ORDER — ONDANSETRON HCL 4 MG/2ML IJ SOLN: 4.0000 mg | Freq: Three times a day (TID) | INTRAMUSCULAR | Status: DC | PRN

## 2020-09-29 MED ORDER — HYDROCODONE-ACETAMINOPHEN 7.5-325 MG PO TABS
1.0000 | ORAL_TABLET | ORAL | Status: DC | PRN
  Administered 2020-09-30: 2 via ORAL
  Filled 2020-09-29: qty 2

## 2020-09-29 MED ORDER — CEFAZOLIN SODIUM-DEXTROSE 2-4 GM/100ML-% IV SOLN
2.0000 g | Freq: Once | INTRAVENOUS | Status: AC
  Administered 2020-09-29: 2 g via INTRAVENOUS
  Filled 2020-09-29: qty 100

## 2020-09-29 MED ORDER — MORPHINE SULFATE (PF) 2 MG/ML IV SOLN
0.5000 mg | INTRAVENOUS | Status: DC | PRN
  Administered 2020-09-30 – 2020-10-02 (×7): 1 mg via INTRAVENOUS
  Filled 2020-09-29 (×7): qty 1

## 2020-09-29 MED ORDER — METHOCARBAMOL 500 MG PO TABS: 500.0000 mg | ORAL_TABLET | Freq: Four times a day (QID) | ORAL | Status: DC | PRN

## 2020-09-29 MED ORDER — POLYETHYLENE GLYCOL 3350 17 G PO PACK: 17.0000 g | PACK | Freq: Every day | ORAL | Status: DC | PRN

## 2020-09-29 MED ORDER — DOCUSATE SODIUM 100 MG PO CAPS
100.0000 mg | ORAL_CAPSULE | Freq: Two times a day (BID) | ORAL | Status: DC
  Administered 2020-09-30 – 2020-10-02 (×4): 100 mg via ORAL
  Filled 2020-09-29 (×4): qty 1

## 2020-09-29 MED ORDER — METHOCARBAMOL 1000 MG/10ML IJ SOLN
500.0000 mg | Freq: Four times a day (QID) | INTRAVENOUS | Status: DC | PRN
  Filled 2020-09-29: qty 5

## 2020-09-29 MED ORDER — ONDANSETRON HCL 4 MG/2ML IJ SOLN
4.0000 mg | Freq: Four times a day (QID) | INTRAMUSCULAR | Status: DC | PRN
  Administered 2020-09-30 – 2020-10-01 (×4): 4 mg via INTRAVENOUS
  Filled 2020-09-29 (×4): qty 2

## 2020-09-29 MED ORDER — CEFAZOLIN SODIUM-DEXTROSE 2-4 GM/100ML-% IV SOLN
2.0000 g | Freq: Three times a day (TID) | INTRAVENOUS | Status: DC
  Administered 2020-09-30: 2 g via INTRAVENOUS
  Filled 2020-09-29 (×2): qty 100

## 2020-09-29 NOTE — Progress Notes (Signed)
Patient ID: Rebekah Campbell, female   DOB: [birth year], 55 y.o.   MRN: [id number]  Consult received for left humerus fracture  Admit to [facility] for OR likely Monday Open fx order set ordered for Ancef for 24 hours  Pain control Regular diet  Formal admission note with OR plans to follow in am
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
## 2020-09-29 NOTE — ED Notes (Signed)
Wet to dry dressing applied to open wound on right arm

## 2020-09-30 ENCOUNTER — Inpatient Hospital Stay (HOSPITAL_COMMUNITY): Payer: 59

## 2020-09-30 DIAGNOSIS — S42402B Unspecified fracture of lower end of left humerus, initial encounter for open fracture: Secondary | ICD-10-CM | POA: Diagnosis present

## 2020-09-30 LAB — CBC
HCT: 34.9 % — ABNORMAL LOW (ref 36.0–46.0)
Hemoglobin: 11.7 g/dL — ABNORMAL LOW (ref 12.0–15.0)
MCH: 32.8 pg (ref 26.0–34.0)
MCHC: 33.5 g/dL (ref 30.0–36.0)
MCV: 97.8 fL (ref 80.0–100.0)
Platelets: 193 10*3/uL (ref 150–400)
RBC: 3.57 MIL/uL — ABNORMAL LOW (ref 3.87–5.11)
RDW: 13 % (ref 11.5–15.5)
WBC: 8.7 10*3/uL (ref 4.0–10.5)
nRBC: 0 % (ref 0.0–0.2)

## 2020-09-30 LAB — TYPE AND SCREEN
ABO/RH(D): O NEG
Antibody Screen: NEGATIVE

## 2020-09-30 LAB — RESP PANEL BY RT-PCR (FLU A&B, COVID) ARPGX2
Influenza A by PCR: NEGATIVE
Influenza B by PCR: NEGATIVE
SARS Coronavirus 2 by RT PCR: NEGATIVE

## 2020-09-30 LAB — ABO/RH: ABO/RH(D): O NEG

## 2020-09-30 LAB — ETHANOL: Alcohol, Ethyl (B): <10 mg/dL (ref <10)

## 2020-09-30 LAB — HIV ANTIBODY (ROUTINE TESTING W REFLEX): HIV Screen 4th Generation wRfx: NONREACTIVE

## 2020-09-30 MED ORDER — SODIUM CHLORIDE 0.9 % IV SOLN
12.5000 mg | Freq: Four times a day (QID) | INTRAVENOUS | Status: DC | PRN
  Filled 2020-09-30 (×3): qty 0.5

## 2020-09-30 MED ORDER — ACETAMINOPHEN 10 MG/ML IV SOLN
1000.0000 mg | Freq: Four times a day (QID) | INTRAVENOUS | Status: AC
  Administered 2020-09-30 – 2020-10-01 (×4): 1000 mg via INTRAVENOUS
  Filled 2020-09-30 (×5): qty 100

## 2020-09-30 MED ORDER — OXYCODONE HCL 5 MG PO TABS
5.0000 mg | ORAL_TABLET | Freq: Four times a day (QID) | ORAL | Status: DC | PRN
  Administered 2020-09-30 – 2020-10-02 (×4): 10 mg via ORAL
  Filled 2020-09-30 (×4): qty 2

## 2020-09-30 MED ORDER — CEFAZOLIN SODIUM-DEXTROSE 2-4 GM/100ML-% IV SOLN
2.0000 g | Freq: Three times a day (TID) | INTRAVENOUS | Status: DC
  Administered 2020-09-30 – 2020-10-01 (×3): 2 g via INTRAVENOUS
  Filled 2020-09-30 (×5): qty 100

## 2020-09-30 MED ORDER — METOCLOPRAMIDE HCL 10 MG PO TABS
5.0000 mg | ORAL_TABLET | Freq: Three times a day (TID) | ORAL | Status: DC | PRN
  Filled 2020-09-30: qty 1

## 2020-09-30 MED ORDER — METHOCARBAMOL 1000 MG/10ML IJ SOLN
500.0000 mg | Freq: Three times a day (TID) | INTRAVENOUS | Status: DC
  Administered 2020-09-30 – 2020-10-01 (×2): 500 mg via INTRAVENOUS
  Filled 2020-09-30: qty 500
  Filled 2020-09-30 (×2): qty 5

## 2020-09-30 MED ORDER — METOCLOPRAMIDE HCL 5 MG/ML IJ SOLN: 5.0000 mg | Freq: Three times a day (TID) | INTRAMUSCULAR | Status: DC | PRN

## 2020-09-30 MED ORDER — TRAMADOL HCL 50 MG PO TABS
50.0000 mg | ORAL_TABLET | Freq: Four times a day (QID) | ORAL | Status: DC | PRN
  Administered 2020-09-30: 50 mg via ORAL
  Filled 2020-09-30: qty 1

## 2020-09-30 NOTE — Plan of Care (Signed)
  Problem: Activity: Goal: Risk for activity intolerance will decrease Outcome: Progressing   Problem: Coping: Goal: Level of anxiety will decrease Outcome: Progressing   Problem: Pain Managment: Goal: General experience of comfort will improve Outcome: Progressing   Problem: Safety: Goal: Ability to remain free from injury will improve Outcome: Progressing   Problem: Skin Integrity: Goal: Risk for impaired skin integrity will decrease Outcome: Progressing   
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
## 2020-09-30 NOTE — ED Notes (Signed)
Call placed to [facility] for patient transport to [facility].

## 2020-09-30 NOTE — Progress Notes (Signed)
Orthopedic Tech Progress Note Patient Details:  Rebekah Campbell January 14, 1966 [id number]  Ortho Devices Type of Ortho Device: Post (long arm) splint Ortho Device/Splint Location: lue Ortho Device/Splint Interventions: Ordered,Application,Adjustment   Post Interventions Patient Tolerated: Well Instructions Provided: Care of device,Adjustment of device   [name] 09/30/2020, 1:23 AM

## 2020-09-30 NOTE — ED Notes (Signed)
Adjusted in bed for comfort.

## 2020-09-30 NOTE — ED Notes (Signed)
Ortho tech at bedside applying splint. 

## 2020-09-30 NOTE — Progress Notes (Signed)
Orthopedic Tech Progress Note Patient Details:  Rebekah Campbell 1965-08-22 [id number]  Patient ID: Merita Norton, female   DOB: June 14, 1966, 55 y.o.   MRN: [id number]   Kizzie Fantasia 09/30/2020, 1:39 PM Pt declined sling. Said PA said to dc sling.

## 2020-10-01 ENCOUNTER — Inpatient Hospital Stay (HOSPITAL_COMMUNITY): Payer: 59

## 2020-10-01 ENCOUNTER — Inpatient Hospital Stay (HOSPITAL_COMMUNITY): Payer: 59 | Admitting: Anesthesiology

## 2020-10-01 ENCOUNTER — Encounter (HOSPITAL_COMMUNITY): Payer: Self-pay | Admitting: Orthopedic Surgery

## 2020-10-01 ENCOUNTER — Encounter (HOSPITAL_COMMUNITY)
Admission: EM | Disposition: A | Discharge status: Home / Self Care | Payer: Self-pay | Source: Home / Self Care | Attending: Orthopedic Surgery

## 2020-10-01 HISTORY — PX: I & D EXTREMITY: SHX5045

## 2020-10-01 HISTORY — PX: OTHER SURGICAL HISTORY: SHX169

## 2020-10-01 HISTORY — PX: ORIF HUMERUS FRACTURE: SHX2126

## 2020-10-01 LAB — COMPREHENSIVE METABOLIC PANEL
ALT: 28 U/L (ref 0–44)
AST: 48 U/L — ABNORMAL HIGH (ref 15–41)
Albumin: 3.2 g/dL — ABNORMAL LOW (ref 3.5–5.0)
Alkaline Phosphatase: 72 U/L (ref 38–126)
Anion gap: 7 (ref 5–15)
BUN: 12 mg/dL (ref 6–20)
CO2: 27 mmol/L (ref 22–32)
Calcium: 8.6 mg/dL — ABNORMAL LOW (ref 8.9–10.3)
Chloride: 101 mmol/L (ref 98–111)
Creatinine, Ser: 0.58 mg/dL (ref 0.44–1.00)
GFR, Estimated: >60 mL/min (ref >60)
Glucose, Bld: 91 mg/dL (ref 70–99)
Potassium: 3.3 mmol/L — ABNORMAL LOW (ref 3.5–5.1)
Sodium: 135 mmol/L (ref 135–145)
Total Bilirubin: 1 mg/dL (ref 0.3–1.2)
Total Protein: 5.9 g/dL — ABNORMAL LOW (ref 6.5–8.1)

## 2020-10-01 LAB — PROTIME-INR
INR: 1 (ref 0.8–1.2)
Prothrombin Time: 12.5 seconds (ref 11.4–15.2)

## 2020-10-01 LAB — RAPID URINE DRUG SCREEN, HOSP PERFORMED
Amphetamines: POSITIVE — AB
Barbiturates: NOT DETECTED
Benzodiazepines: NOT DETECTED
Cocaine: NOT DETECTED
Opiates: POSITIVE — AB
Tetrahydrocannabinol: NOT DETECTED

## 2020-10-01 LAB — CBC
HCT: 33.9 % — ABNORMAL LOW (ref 36.0–46.0)
Hemoglobin: 11.3 g/dL — ABNORMAL LOW (ref 12.0–15.0)
MCH: 33.5 pg (ref 26.0–34.0)
MCHC: 33.3 g/dL (ref 30.0–36.0)
MCV: 100.6 fL — ABNORMAL HIGH (ref 80.0–100.0)
Platelets: 176 10*3/uL (ref 150–400)
RBC: 3.37 MIL/uL — ABNORMAL LOW (ref 3.87–5.11)
RDW: 13.1 % (ref 11.5–15.5)
WBC: 5.1 10*3/uL (ref 4.0–10.5)
nRBC: 0 % (ref 0.0–0.2)

## 2020-10-01 LAB — URINALYSIS, ROUTINE W REFLEX MICROSCOPIC
Bacteria, UA: NONE SEEN
Bilirubin Urine: NEGATIVE
Glucose, UA: NEGATIVE mg/dL
Hgb urine dipstick: NEGATIVE
Ketones, ur: 20 mg/dL — AB
Nitrite: NEGATIVE
Protein, ur: NEGATIVE mg/dL
Specific Gravity, Urine: 1.024 (ref 1.005–1.030)
pH: 7 (ref 5.0–8.0)

## 2020-10-01 LAB — VITAMIN D 25 HYDROXY (VIT D DEFICIENCY, FRACTURES): Vit D, 25-Hydroxy: 62.21 ng/mL (ref 30–100)

## 2020-10-01 LAB — SURGICAL PCR SCREEN
MRSA, PCR: NEGATIVE
Staphylococcus aureus: NEGATIVE

## 2020-10-01 SURGERY — IRRIGATION AND DEBRIDEMENT EXTREMITY
Anesthesia: General | Laterality: Left

## 2020-10-01 MED ORDER — FENTANYL CITRATE (PF) 250 MCG/5ML IJ SOLN
INTRAMUSCULAR | Status: DC | PRN
  Administered 2020-10-01: 150 ug via INTRAVENOUS
  Administered 2020-10-01 (×3): 50 ug via INTRAVENOUS

## 2020-10-01 MED ORDER — DEXMEDETOMIDINE (PRECEDEX) IN NS 20 MCG/5ML (4 MCG/ML) IV SYRINGE
PREFILLED_SYRINGE | INTRAVENOUS | Status: DC | PRN
  Administered 2020-10-01: 8 ug via INTRAVENOUS
  Administered 2020-10-01: 4 ug via INTRAVENOUS

## 2020-10-01 MED ORDER — DEXAMETHASONE SODIUM PHOSPHATE 10 MG/ML IJ SOLN
INTRAMUSCULAR | Status: DC | PRN
  Administered 2020-10-01: 5 mg via INTRAVENOUS

## 2020-10-01 MED ORDER — FENTANYL CITRATE (PF) 100 MCG/2ML IJ SOLN
INTRAMUSCULAR | Status: AC
  Filled 2020-10-01: qty 2

## 2020-10-01 MED ORDER — ACETAMINOPHEN 10 MG/ML IV SOLN
INTRAVENOUS | Status: DC | PRN
  Administered 2020-10-01: 1000 mg via INTRAVENOUS

## 2020-10-01 MED ORDER — SUGAMMADEX SODIUM 200 MG/2ML IV SOLN
INTRAVENOUS | Status: DC | PRN
  Administered 2020-10-01: 200 mg via INTRAVENOUS

## 2020-10-01 MED ORDER — CHLORHEXIDINE GLUCONATE 0.12 % MT SOLN
OROMUCOSAL | Status: AC
  Administered 2020-10-01: 15 mL
  Filled 2020-10-01: qty 15

## 2020-10-01 MED ORDER — ACETAMINOPHEN 500 MG PO TABS
1000.0000 mg | ORAL_TABLET | Freq: Four times a day (QID) | ORAL | Status: DC
  Administered 2020-10-02 (×3): 1000 mg via ORAL
  Filled 2020-10-01 (×4): qty 2

## 2020-10-01 MED ORDER — ACETAMINOPHEN 10 MG/ML IV SOLN: 1000.0000 mg | Freq: Once | INTRAVENOUS | Status: DC | PRN

## 2020-10-01 MED ORDER — VANCOMYCIN HCL 1000 MG IV SOLR
INTRAVENOUS | Status: AC
  Filled 2020-10-01: qty 1000

## 2020-10-01 MED ORDER — PROPOFOL 10 MG/ML IV BOLUS
INTRAVENOUS | Status: DC | PRN
  Administered 2020-10-01: 20 mg via INTRAVENOUS
  Administered 2020-10-01: 120 mg via INTRAVENOUS
  Administered 2020-10-01: 30 mg via INTRAVENOUS

## 2020-10-01 MED ORDER — 0.9 % SODIUM CHLORIDE (POUR BTL) OPTIME
TOPICAL | Status: DC | PRN
  Administered 2020-10-01: 1000 mL

## 2020-10-01 MED ORDER — OXYCODONE HCL 5 MG/5ML PO SOLN
5.0000 mg | Freq: Once | ORAL | Status: DC | PRN
Start: 2020-10-01 — End: 2020-10-01

## 2020-10-01 MED ORDER — DEXMEDETOMIDINE (PRECEDEX) IN NS 20 MCG/5ML (4 MCG/ML) IV SYRINGE
PREFILLED_SYRINGE | INTRAVENOUS | Status: AC
  Filled 2020-10-01: qty 5

## 2020-10-01 MED ORDER — ACETAMINOPHEN 160 MG/5ML PO SOLN: 1000.0000 mg | Freq: Once | ORAL | Status: DC | PRN

## 2020-10-01 MED ORDER — PROPOFOL 10 MG/ML IV BOLUS
INTRAVENOUS | Status: AC
  Filled 2020-10-01: qty 20

## 2020-10-01 MED ORDER — ROCURONIUM BROMIDE 10 MG/ML (PF) SYRINGE
PREFILLED_SYRINGE | INTRAVENOUS | Status: DC | PRN
  Administered 2020-10-01 (×2): 10 mg via INTRAVENOUS
  Administered 2020-10-01: 60 mg via INTRAVENOUS

## 2020-10-01 MED ORDER — LIDOCAINE 2% (20 MG/ML) 5 ML SYRINGE
INTRAMUSCULAR | Status: AC
  Filled 2020-10-01: qty 5

## 2020-10-01 MED ORDER — VITAMIN D 25 MCG (1000 UNIT) PO TABS
1000.0000 [IU] | ORAL_TABLET | Freq: Every day | ORAL | Status: DC
  Administered 2020-10-02: 1000 [IU] via ORAL
  Filled 2020-10-01: qty 1

## 2020-10-01 MED ORDER — VANCOMYCIN HCL 1000 MG IV SOLR
INTRAVENOUS | Status: DC | PRN
  Administered 2020-10-01: 1000 mg via TOPICAL

## 2020-10-01 MED ORDER — FENTANYL CITRATE (PF) 100 MCG/2ML IJ SOLN
25.0000 ug | INTRAMUSCULAR | Status: DC | PRN
  Administered 2020-10-01 (×2): 25 ug via INTRAVENOUS

## 2020-10-01 MED ORDER — BUPIVACAINE LIPOSOME 1.3 % IJ SUSP
INTRAMUSCULAR | Status: DC | PRN
  Administered 2020-10-01: 10 mL via PERINEURAL

## 2020-10-01 MED ORDER — FENTANYL CITRATE (PF) 250 MCG/5ML IJ SOLN
INTRAMUSCULAR | Status: AC
  Filled 2020-10-01: qty 5

## 2020-10-01 MED ORDER — BUPIVACAINE-EPINEPHRINE (PF) 0.5% -1:200000 IJ SOLN
INTRAMUSCULAR | Status: DC | PRN
  Administered 2020-10-01: 15 mL via PERINEURAL

## 2020-10-01 MED ORDER — CHLORHEXIDINE GLUCONATE 0.12 % MT SOLN
OROMUCOSAL | Status: AC
  Filled 2020-10-01: qty 15

## 2020-10-01 MED ORDER — CEFAZOLIN SODIUM-DEXTROSE 2-4 GM/100ML-% IV SOLN
2.0000 g | Freq: Three times a day (TID) | INTRAVENOUS | Status: AC
  Administered 2020-10-01 – 2020-10-02 (×3): 2 g via INTRAVENOUS
  Filled 2020-10-01 (×3): qty 100

## 2020-10-01 MED ORDER — DEXAMETHASONE SODIUM PHOSPHATE 10 MG/ML IJ SOLN
INTRAMUSCULAR | Status: AC
  Filled 2020-10-01: qty 1

## 2020-10-01 MED ORDER — MIDAZOLAM HCL 2 MG/2ML IJ SOLN
INTRAMUSCULAR | Status: DC | PRN
  Administered 2020-10-01: 2 mg via INTRAVENOUS

## 2020-10-01 MED ORDER — CEFAZOLIN SODIUM-DEXTROSE 2-3 GM-%(50ML) IV SOLR
INTRAVENOUS | Status: DC | PRN
  Administered 2020-10-01: 2 g via INTRAVENOUS

## 2020-10-01 MED ORDER — PANTOPRAZOLE SODIUM 40 MG PO TBEC
40.0000 mg | DELAYED_RELEASE_TABLET | Freq: Every day | ORAL | Status: DC
  Administered 2020-10-02: 40 mg via ORAL
  Filled 2020-10-01: qty 1

## 2020-10-01 MED ORDER — ENOXAPARIN SODIUM 40 MG/0.4ML ~~LOC~~ SOLN
40.0000 mg | SUBCUTANEOUS | Status: DC
  Administered 2020-10-02: 40 mg via SUBCUTANEOUS
  Filled 2020-10-01: qty 0.4

## 2020-10-01 MED ORDER — ONDANSETRON HCL 4 MG/2ML IJ SOLN
INTRAMUSCULAR | Status: AC
  Filled 2020-10-01: qty 2

## 2020-10-01 MED ORDER — OXYCODONE HCL 5 MG PO TABS: 5.0000 mg | ORAL_TABLET | Freq: Once | ORAL | Status: DC | PRN

## 2020-10-01 MED ORDER — ACETAMINOPHEN 500 MG PO TABS: 1000.0000 mg | ORAL_TABLET | Freq: Once | ORAL | Status: DC | PRN

## 2020-10-01 MED ORDER — MIDAZOLAM HCL 2 MG/2ML IJ SOLN
INTRAMUSCULAR | Status: AC
  Filled 2020-10-01: qty 2

## 2020-10-01 MED ORDER — LACTATED RINGERS IV SOLN: INTRAVENOUS | Status: DC | PRN

## 2020-10-01 MED ORDER — ONDANSETRON HCL 4 MG/2ML IJ SOLN
INTRAMUSCULAR | Status: DC | PRN
  Administered 2020-10-01: 4 mg via INTRAVENOUS

## 2020-10-01 MED ORDER — KETOROLAC TROMETHAMINE 30 MG/ML IJ SOLN
INTRAMUSCULAR | Status: AC
  Filled 2020-10-01: qty 1

## 2020-10-01 SURGICAL SUPPLY — 89 items
BIT DRILL QC 2.0 SHORT EVOS SM (DRILL) ×1 IMPLANT
BIT DRILL QC 2.5MM SHRT EVO SM (DRILL) ×1 IMPLANT
BLADE AVERAGE 25X9 (BLADE) IMPLANT
BNDG COHESIVE 4X5 TAN STRL (GAUZE/BANDAGES/DRESSINGS) ×2 IMPLANT
BNDG ELASTIC 6X5.8 VLCR STR LF (GAUZE/BANDAGES/DRESSINGS) ×2 IMPLANT
BNDG ESMARK 4X9 LF (GAUZE/BANDAGES/DRESSINGS) IMPLANT
BNDG GAUZE ELAST 4 BULKY (GAUZE/BANDAGES/DRESSINGS) ×4 IMPLANT
BRUSH SCRUB EZ PLAIN DRY (MISCELLANEOUS) ×4 IMPLANT
CHLORAPREP W/TINT 26 (MISCELLANEOUS) ×2 IMPLANT
CLEANER TIP ELECTROSURG 2X2 (MISCELLANEOUS) ×2 IMPLANT
CORD BIPOLAR FORCEPS 12FT (ELECTRODE) ×2 IMPLANT
COVER MAYO STAND STRL (DRAPES) ×2 IMPLANT
COVER SURGICAL LIGHT HANDLE (MISCELLANEOUS) ×4 IMPLANT
COVER WAND RF STERILE (DRAPES) ×2 IMPLANT
DERMABOND ADVANCED (GAUZE/BANDAGES/DRESSINGS) ×1
DERMABOND ADVANCED .7 DNX12 (GAUZE/BANDAGES/DRESSINGS) ×1 IMPLANT
DRAIN PENROSE 1/4X12 LTX STRL (WOUND CARE) IMPLANT
DRAPE C-ARM 42X72 X-RAY (DRAPES) ×2 IMPLANT
DRAPE C-ARMOR (DRAPES) IMPLANT
DRAPE HALF SHEET 40X57 (DRAPES) ×2 IMPLANT
DRAPE INCISE IOBAN 66X45 STRL (DRAPES) IMPLANT
DRAPE ORTHO SPLIT 77X108 STRL (DRAPES) ×1
DRAPE SURG 17X23 STRL (DRAPES) ×2 IMPLANT
DRAPE SURG ORHT 6 SPLT 77X108 (DRAPES) ×1 IMPLANT
DRAPE U-SHAPE 47X51 STRL (DRAPES) ×2 IMPLANT
DRILL QC 2.0 SHORT EVOS SM (DRILL) ×2
DRILL QC 2.5MM SHORT EVOS SM (DRILL) ×2
DRSG ADAPTIC 3X8 NADH LF (GAUZE/BANDAGES/DRESSINGS) ×2 IMPLANT
DRSG PAD ABDOMINAL 8X10 ST (GAUZE/BANDAGES/DRESSINGS) ×2 IMPLANT
ELECT REM PT RETURN 9FT ADLT (ELECTROSURGICAL) ×2
ELECTRODE REM PT RTRN 9FT ADLT (ELECTROSURGICAL) ×1 IMPLANT
EVACUATOR 1/8 PVC DRAIN (DRAIN) IMPLANT
GAUZE SPONGE 4X4 12PLY STRL (GAUZE/BANDAGES/DRESSINGS) ×4 IMPLANT
GAUZE SPONGE 4X4 16PLY XRAY LF (GAUZE/BANDAGES/DRESSINGS) ×2 IMPLANT
GLOVE BIO SURGEON STRL SZ 6.5 (GLOVE) ×6 IMPLANT
GLOVE BIO SURGEON STRL SZ7.5 (GLOVE) ×8 IMPLANT
GLOVE BIOGEL PI IND STRL 7.5 (GLOVE) ×1 IMPLANT
GLOVE BIOGEL PI INDICATOR 7.5 (GLOVE) ×1
GLOVE SURG UNDER POLY LF SZ6.5 (GLOVE) ×2 IMPLANT
GOWN STRL REUS W/ TWL LRG LVL3 (GOWN DISPOSABLE) ×3 IMPLANT
GOWN STRL REUS W/ TWL XL LVL3 (GOWN DISPOSABLE) ×1 IMPLANT
GOWN STRL REUS W/TWL LRG LVL3 (GOWN DISPOSABLE) ×3
GOWN STRL REUS W/TWL XL LVL3 (GOWN DISPOSABLE) ×1
HANDPIECE INTERPULSE COAX TIP (DISPOSABLE)
K-WIRE 1.6 (WIRE) ×2
K-WIRE FX150X1.6XTROC PNT (WIRE) ×2
KIT BASIN OR (CUSTOM PROCEDURE TRAY) ×2 IMPLANT
KIT TURNOVER KIT B (KITS) ×2 IMPLANT
KWIRE FX150X1.6XTROC PNT (WIRE) ×2 IMPLANT
LOOP VESSEL MAXI BLUE (MISCELLANEOUS) ×2 IMPLANT
MANIFOLD NEPTUNE II (INSTRUMENTS) ×2 IMPLANT
NEEDLE HYPO 25X1 1.5 SAFETY (NEEDLE) IMPLANT
NS IRRIG 1000ML POUR BTL (IV SOLUTION) ×2 IMPLANT
PACK ORTHO EXTREMITY (CUSTOM PROCEDURE TRAY) ×2 IMPLANT
PAD ABD 8X10 STRL (GAUZE/BANDAGES/DRESSINGS) ×2 IMPLANT
PAD ARMBOARD 7.5X6 YLW CONV (MISCELLANEOUS) ×4 IMPLANT
PADDING CAST COTTON 6X4 STRL (CAST SUPPLIES) ×2 IMPLANT
PLATE HUM 2.7/3.5X195 16H LT (Plate) ×2 IMPLANT
SCREW CORT 3.5X17 ST EVOS (Screw) ×2 IMPLANT
SCREW CORT 3.5X20 ST EVOS (Screw) ×2 IMPLANT
SCREW CORT 3.5X22 ST EVOS (Screw) ×4 IMPLANT
SCREW CORTEX 3.5X24MM (Screw) ×8 IMPLANT
SCREW CORTEX 3.5X26 (Screw) ×2 IMPLANT
SCREW LOCK 2.7X13 ST EVOS (Screw) ×2 IMPLANT
SCREW LOCK ST EVOS 2.7X20 (Screw) ×2 IMPLANT
SET HNDPC FAN SPRY TIP SCT (DISPOSABLE) IMPLANT
SPONGE LAP 18X18 RF (DISPOSABLE) ×2 IMPLANT
STAPLER VISISTAT 35W (STAPLE) IMPLANT
STOCKINETTE IMPERVIOUS 9X36 MD (GAUZE/BANDAGES/DRESSINGS) ×2 IMPLANT
SUCTION FRAZIER HANDLE 10FR (MISCELLANEOUS) ×1
SUCTION TUBE FRAZIER 10FR DISP (MISCELLANEOUS) ×1 IMPLANT
SUT ETHILON 2 0 FS 18 (SUTURE) ×4 IMPLANT
SUT ETHILON 3 0 PS 1 (SUTURE) ×4 IMPLANT
SUT MON AB 2-0 CT1 36 (SUTURE) ×2 IMPLANT
SUT PDS AB 0 CT 36 (SUTURE) IMPLANT
SUT VIC AB 0 CT1 27 (SUTURE) ×2
SUT VIC AB 0 CT1 27XBRD ANBCTR (SUTURE) ×2 IMPLANT
SUT VIC AB 2-0 CT1 27 (SUTURE) ×2
SUT VIC AB 2-0 CT1 TAPERPNT 27 (SUTURE) ×2 IMPLANT
SWAB CULTURE ESWAB REG 1ML (MISCELLANEOUS) IMPLANT
SYR 5ML LL (SYRINGE) IMPLANT
SYR CONTROL 10ML LL (SYRINGE) ×2 IMPLANT
TOWEL GREEN STERILE (TOWEL DISPOSABLE) ×4 IMPLANT
TOWEL GREEN STERILE FF (TOWEL DISPOSABLE) ×2 IMPLANT
TRAY FOLEY MTR SLVR 16FR STAT (SET/KITS/TRAYS/PACK) IMPLANT
TUBE CONNECTING 12X1/4 (SUCTIONS) ×2 IMPLANT
UNDERPAD 30X36 HEAVY ABSORB (UNDERPADS AND DIAPERS) ×2 IMPLANT
WATER STERILE IRR 1000ML POUR (IV SOLUTION) ×2 IMPLANT
YANKAUER SUCT BULB TIP NO VENT (SUCTIONS) ×2 IMPLANT

## 2020-10-01 NOTE — Progress Notes (Signed)
Patient's name and date of birth verified prior to procedure

## 2020-10-01 NOTE — Transfer of Care (Signed)
Immediate Anesthesia Transfer of Care Note  Patient: Rebekah Campbell  Procedure(s) Performed: IRRIGATION AND DEBRIDEMENT EXTREMITY (Left ) OPEN REDUCTION INTERNAL FIXATION (ORIF) DISTAL HUMERUS FRACTURE (Left )  Patient Location: PACU  Anesthesia Type:General  Level of Consciousness: drowsy, patient cooperative and responds to stimulation  Airway & Oxygen Therapy: Patient Spontanous Breathing  Post-op Assessment: Report given to RN, Post -op Vital signs reviewed and stable and Patient moving all extremities  Post vital signs: Reviewed and stable  Last Vitals:  Vitals Value Taken Time  BP 117/79 10/01/20 1456  Temp    Pulse 78 10/01/20 1458  Resp 22 10/01/20 1458  SpO2 94 % 10/01/20 1458  Vitals shown include unvalidated device data.  Last Pain:  Vitals:   10/01/20 0936  TempSrc:   PainSc: 2          Complications: No complications documented.

## 2020-10-01 NOTE — Anesthesia Procedure Notes (Signed)
Anesthesia Regional Block: Interscalene brachial plexus block   Pre-Anesthetic Checklist: ,, timeout performed, Correct Patient, Correct Site, Correct Laterality, Correct Procedure, Correct Position, site marked, Risks and benefits discussed, pre-op evaluation,  At surgeon's request and post-op pain management  Laterality: Left  Prep: Maximum Sterile Barrier Precautions used, chloraprep       Needles:  Injection technique: Single-shot  Needle Type: Echogenic Stimulator Needle     Needle Length: 4cm  Needle Gauge: 22     Additional Needles:   Procedures:,,,, ultrasound used (permanent image in chart),,,,  Narrative:  Start time: 10/01/2020 3:50 PM End time: 10/01/2020 3:53 PM Injection made incrementally with aspirations every 5 mL.  Performed by: Personally  Anesthesiologist: Kaylyn Layer, MD  Additional Notes: Risks, benefits, and alternative discussed. Patient gave consent for procedure. Patient prepped and draped in sterile fashion. Sedation administered, patient remains easily responsive to voice. Relevant anatomy identified with ultrasound guidance. Local anesthetic given in 5cc increments with no signs or symptoms of intravascular injection. No pain or paraesthesias with injection. Patient monitored throughout procedure with signs of LAST or immediate complications. Tolerated well. Ultrasound image placed in chart.  Amalia Greenhouse, MD

## 2020-10-01 NOTE — Interval H&P Note (Signed)
History and Physical Interval Note:  10/01/2020 12:10 PM  Rebekah Campbell  has presented today for surgery, with the diagnosis of open left humerus fracture.  The various methods of treatment have been discussed with the patient and family. After consideration of risks, benefits and other options for treatment, the patient has consented to  Procedure(s): IRRIGATION AND DEBRIDEMENT EXTREMITY (Left) OPEN REDUCTION INTERNAL FIXATION (ORIF) DISTAL HUMERUS FRACTURE (Left) as a surgical intervention.  The patient's history has been reviewed, patient examined, no change in status, stable for surgery.  I have reviewed the patient's chart and labs.  Questions were answered to the patient's satisfaction.     Caryn Bee P Almetta Liddicoat

## 2020-10-01 NOTE — Progress Notes (Signed)
Patient resting quietly at this time reports burning at elbow when she is awake patient noted to be snoring in between conservations

## 2020-10-01 NOTE — Anesthesia Procedure Notes (Signed)
Procedure Name: Intubation Date/Time: 10/01/2020 12:33 PM Performed by: Rande Brunt, CRNA Pre-anesthesia Checklist: Patient identified, Emergency Drugs available, Suction available and Patient being monitored Patient Re-evaluated:Patient Re-evaluated prior to induction Oxygen Delivery Method: Circle System Utilized Preoxygenation: Pre-oxygenation with 100% oxygen Induction Type: IV induction Ventilation: Mask ventilation without difficulty Laryngoscope Size: Mac and 3 Grade View: Grade I Tube type: Oral Number of attempts: 1 Airway Equipment and Method: Stylet Placement Confirmation: ETT inserted through vocal cords under direct vision,  positive ETCO2 and breath sounds checked- equal and bilateral Secured at: 22 cm Tube secured with: Tape Dental Injury: Teeth and Oropharynx as per pre-operative assessment
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
## 2020-10-01 NOTE — Anesthesia Postprocedure Evaluation (Signed)
Anesthesia Post Note  Patient: Rebekah Campbell  Procedure(s) Performed: IRRIGATION AND DEBRIDEMENT EXTREMITY (Left ) OPEN REDUCTION INTERNAL FIXATION (ORIF) DISTAL HUMERUS FRACTURE (Left )     Patient location during evaluation: PACU Anesthesia Type: General Level of consciousness: awake and alert and oriented Pain management: pain level controlled Vital Signs Assessment: post-procedure vital signs reviewed and stable Respiratory status: spontaneous breathing, nonlabored ventilation and respiratory function stable Cardiovascular status: blood pressure returned to baseline Postop Assessment: no apparent nausea or vomiting Anesthetic complications: no   No complications documented.  Last Vitals:  Vitals:   10/01/20 1600 10/01/20 1615  BP: 121/76 108/68  Pulse: 81 91  Resp: 12 15  Temp:  36.5 C  SpO2: 96% 93%    Last Pain:  Vitals:   10/01/20 1615  TempSrc:   PainSc: Asleep                 Kaylyn Layer
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
## 2020-10-01 NOTE — Progress Notes (Signed)
PT Cancellation Note  Patient Details Name: Rebekah Campbell MRN: [id number] DOB: 03/27/1966   Cancelled Treatment:    Reason Eval/Treat Not Completed: Other (comment) per chart, patient to OR today to address humerus fx. Holding for now, will continue to follow. If patient has urgent rehab needs, please direct message this therapist in Epic (8am-4pm) or page at number below.     Madelaine Etienne, DPT, PN1   Supplemental Physical Therapist [facility]    Pager [phone] Acute Rehab Office [phone]

## 2020-10-01 NOTE — Progress Notes (Signed)
OT Cancellation Note  Patient Details Name: Rebekah Campbell MRN: [id number] DOB: 1965/12/22   Cancelled Treatment:    Reason Eval/Treat Not Completed: Other (comment) (Scheduled for OR today, will follow up at a later time)  Lelon Mast A Joyous Gleghorn 10/01/2020, 8:17 AM

## 2020-10-01 NOTE — Anesthesia Preprocedure Evaluation (Signed)
Anesthesia Evaluation  Patient identified by MRN, date of birth, ID band Patient awake    Reviewed: Allergy & Precautions, NPO status , Patient's Chart, lab work & pertinent test results  History of Anesthesia Complications Negative for: history of anesthetic complications  Airway Mallampati: II  TM Distance: >3 FB Neck ROM: Full    Dental  (+) Dental Advisory Given, Teeth Intact   Pulmonary neg shortness of breath, neg sleep apnea, neg COPD, neg recent URI,  Covid-19 Nucleic Acid Test Results Lab Results      Component                Value               Date                      SARSCOV2NAA              NEGATIVE            09/29/2020              breath sounds clear to auscultation       Cardiovascular hypertension, Pt. on medications (-) angina(-) Past MI and (-) CHF  Rhythm:Regular     Neuro/Psych PSYCHIATRIC DISORDERS Anxiety Depression    GI/Hepatic negative GI ROS, Neg liver ROS,   Endo/Other  negative endocrine ROS  Renal/GU negative Renal ROSLab Results      Component                Value               Date                      CREATININE               0.58                10/01/2020                Musculoskeletal  (+) Arthritis , open left humerus fracture   Abdominal   Peds  Hematology  (+) Blood dyscrasia, anemia , Lab Results      Component                Value               Date                      WBC                      5.1                 10/01/2020                HGB                      11.3 (L)            10/01/2020                HCT                      33.9 (L)            10/01/2020                MCV  100.6 (H)           10/01/2020                PLT                      176                 10/01/2020              Anesthesia Other Findings   Reproductive/Obstetrics                             Anesthesia Physical Anesthesia Plan  ASA:  II  Anesthesia Plan: General   Post-op Pain Management:    Induction: Intravenous  PONV Risk Score and Plan: 3 and Ondansetron and Dexamethasone  Airway Management Planned: Oral ETT  Additional Equipment: None  Intra-op Plan:   Post-operative Plan: Extubation in OR  Informed Consent: I have reviewed the patients History and Physical, chart, labs and discussed the procedure including the risks, benefits and alternatives for the proposed anesthesia with the patient or authorized representative who has indicated his/her understanding and acceptance.     Dental advisory given  Plan Discussed with: CRNA and Surgeon  Anesthesia Plan Comments: (Due to injury location and concern for radial nerve injury surgeon requested block be preformed post op if indicated. Discussed risks and benefits of UE regional block with patient who is agreeable to potential placement post operatively. )        Anesthesia Quick Evaluation

## 2020-10-01 NOTE — Progress Notes (Signed)
Rebekah Campbell made aware patient requested nerve block at this time

## 2020-10-02 ENCOUNTER — Encounter (HOSPITAL_COMMUNITY): Payer: Self-pay | Admitting: General Practice

## 2020-10-02 LAB — CBC
HCT: 30.3 % — ABNORMAL LOW (ref 36.0–46.0)
Hemoglobin: 10 g/dL — ABNORMAL LOW (ref 12.0–15.0)
MCH: 32.7 pg (ref 26.0–34.0)
MCHC: 33 g/dL (ref 30.0–36.0)
MCV: 99 fL (ref 80.0–100.0)
Platelets: 178 10*3/uL (ref 150–400)
RBC: 3.06 MIL/uL — ABNORMAL LOW (ref 3.87–5.11)
RDW: 12.7 % (ref 11.5–15.5)
WBC: 8.3 10*3/uL (ref 4.0–10.5)
nRBC: 0 % (ref 0.0–0.2)

## 2020-10-02 LAB — BASIC METABOLIC PANEL
Anion gap: 6 (ref 5–15)
BUN: 11 mg/dL (ref 6–20)
CO2: 26 mmol/L (ref 22–32)
Calcium: 8.5 mg/dL — ABNORMAL LOW (ref 8.9–10.3)
Chloride: 102 mmol/L (ref 98–111)
Creatinine, Ser: 0.59 mg/dL (ref 0.44–1.00)
GFR, Estimated: >60 mL/min (ref >60)
Glucose, Bld: 138 mg/dL — ABNORMAL HIGH (ref 70–99)
Potassium: 3.8 mmol/L (ref 3.5–5.1)
Sodium: 134 mmol/L — ABNORMAL LOW (ref 135–145)

## 2020-10-02 MED ORDER — ASPIRIN EC 81 MG PO TBEC: 81.0000 mg | DELAYED_RELEASE_TABLET | Freq: Every day | ORAL | 0 refills | Status: DC

## 2020-10-02 MED ORDER — OXYCODONE HCL 5 MG PO TABS: 5.0000 mg | ORAL_TABLET | Freq: Four times a day (QID) | ORAL | 0 refills | Status: DC | PRN

## 2020-10-02 MED ORDER — DOCUSATE SODIUM 100 MG PO CAPS: 100.0000 mg | ORAL_CAPSULE | Freq: Two times a day (BID) | ORAL | 0 refills | Status: DC

## 2020-10-02 MED ORDER — BACITRACIN-NEOMYCIN-POLYMYXIN OINTMENT TUBE
TOPICAL_OINTMENT | CUTANEOUS | Status: DC | PRN
  Filled 2020-10-02: qty 14

## 2020-10-02 MED ORDER — ONDANSETRON HCL 4 MG PO TABS: 4.0000 mg | ORAL_TABLET | Freq: Four times a day (QID) | ORAL | 0 refills | Status: DC | PRN
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
## 2020-10-02 NOTE — Discharge Instructions (Addendum)
Orthopaedic Trauma Service Discharge Instructions   General Discharge Instructions  WEIGHT BEARING STATUS: Non-weightbearing left upper extremity  RANGE OF MOTION/ACTIVITY: Ok for unrestricted range of motion of shoulder and elbow as tolerated. Use sling as needed for comfort  Wound Care: You may remove surgical dressing on post-op day #2 (Wednesday 10/03/20). You will notice skin glue over your posterior elbow/arm. Incisions can be left open to air if there is no drainage. If incision continues to have drainage, follow wound care instructions below. Okay to shower if no drainage from incisions.  - Do NOT apply any ointments, solutions or lotions to pin sites or surgical wounds.  These prevent needed drainage and even though solutions like hydrogen peroxide kill bacteria, they also damage cells lining the pin sites that help fight infection.  Applying lotions or ointments can keep the wounds moist and can cause them to breakdown and open up as well. This can increase the risk for infection. When in doubt call the office.  - If any drainage is noted, use one layer of adaptic, then gauze, Kerlix, and an ace wrap.  - Once the incision is completely dry and without drainage, it may be left open to air out.  Showering may begin 36-48 hours later.  Cleaning gently with soap and water.  - Traumatic wounds should be dressed daily as well.    One layer of adaptic, gauze, Kerlix, then ace wrap.  The adaptic can be discontinued once the draining has ceased     DVT/PE prophylaxis: Aspirin 81 mg daily x 30 days  Diet: as you were eating previously.  Can use over the counter stool softeners and bowel preparations, such as Miralax, to help with bowel movements.  Narcotics can be constipating.  Be sure to drink plenty of fluids  PAIN MEDICATION USE AND EXPECTATIONS  You have likely been given narcotic medications to help control your pain.  After a traumatic event that results in an fracture (broken  bone) with or without surgery, it is ok to use narcotic pain medications to help control one's pain.  We understand that everyone responds to pain differently and each individual patient will be evaluated on a regular basis for the continued need for narcotic medications. Ideally, narcotic medication use should last no more than 6-8 weeks (coinciding with fracture healing).   As a patient it is your responsibility as well to monitor narcotic medication use and report the amount and frequency you use these medications when you come to your office visit.   We would also advise that if you are using narcotic medications, you should take a dose prior to therapy to maximize you participation.  IF YOU ARE ON NARCOTIC MEDICATIONS IT IS NOT PERMISSIBLE TO OPERATE A MOTOR VEHICLE (MOTORCYCLE/CAR/TRUCK/MOPED) OR HEAVY MACHINERY DO NOT MIX NARCOTICS WITH OTHER CNS (CENTRAL NERVOUS SYSTEM) DEPRESSANTS SUCH AS ALCOHOL   STOP SMOKING OR USING NICOTINE PRODUCTS!!!!  As discussed nicotine severely impairs your body's ability to heal surgical and traumatic wounds but also impairs bone healing.  Wounds and bone heal by forming microscopic blood vessels (angiogenesis) and nicotine is a vasoconstrictor (essentially, shrinks blood vessels).  Therefore, if vasoconstriction occurs to these microscopic blood vessels they essentially disappear and are unable to deliver necessary nutrients to the healing tissue.  This is one modifiable factor that you can do to dramatically increase your chances of healing your injury.    (This means no smoking, no nicotine gum, patches, etc)  DO NOT USE NONSTEROIDAL ANTI-INFLAMMATORY  DRUGS (NSAID'S)  Using products such as Advil (ibuprofen), Aleve (naproxen), Motrin (ibuprofen) for additional pain control during fracture healing can delay and/or prevent the healing response.  If you would like to take over the counter (OTC) medication, Tylenol (acetaminophen) is ok.  However, some narcotic  medications that are given for pain control contain acetaminophen as well. Therefore, you should not exceed more than 4000 mg of tylenol in a day if you do not have liver disease.  Also note that there are may OTC medicines, such as cold medicines and allergy medicines that my contain tylenol as well.  If you have any questions about medications and/or interactions please ask your doctor/PA or your pharmacist.      ICE AND ELEVATE INJURED/OPERATIVE EXTREMITY  Using ice and elevating the injured extremity above your heart can help with swelling and pain control.  Icing in a pulsatile fashion, such as 20 minutes on and 20 minutes off, can be followed.    Do not place ice directly on skin. Make sure there is a barrier between to skin and the ice pack.    Using frozen items such as frozen peas works well as the conform nicely to the are that needs to be iced.  USE AN ACE WRAP OR TED HOSE FOR SWELLING CONTROL  In addition to icing and elevation, Ace wraps or TED hose are used to help limit and resolve swelling.  It is recommended to use Ace wraps or TED hose until you are informed to stop.    When using Ace Wraps start the wrapping distally (farthest away from the body) and wrap proximally (closer to the body)   Example: If you had surgery on your leg or thing and you do not have a splint on, start the ace wrap at the toes and work your way up to the thigh        If you had surgery on your upper extremity and do not have a splint on, start the ace wrap at your fingers and work your way up to the upper arm   CALL THE OFFICE WITH ANY QUESTIONS OR CONCERNS: [phone]   VISIT OUR WEBSITE FOR ADDITIONAL INFORMATION: [email or web address]

## 2020-10-02 NOTE — Progress Notes (Signed)
Discharge instructions addressed; Pt in stable condition; Pt.'s spouse in room & will be pt.'s ride home.

## 2020-10-02 NOTE — Progress Notes (Signed)
PT Cancellation Note  Patient Details Name: Rebekah Campbell MRN: [id number] DOB: 04-30-1966   Cancelled Treatment:    Reason Eval/Treat Not Completed: PT screened, no needs identified, will sign off. Per OT pt is functioning independently and OT has addressed L UE needs. Pt with no acute PT needs at this time. Please re-consult PT if needed in future.  Lewis Shock, PT, DPT Acute Rehabilitation Services Pager #: [phone] Office #: [phone]    Iona Hansen 10/02/2020, 10:34 AM

## 2020-10-02 NOTE — Progress Notes (Signed)
Orthopedic Tech Progress Note Patient Details:  Rebekah Campbell 11-Sep-1965 [id number] Patient was working with THERAPY when I went to apply VELCRO she said she would apply once done with therapy session.   Ortho Devices Type of Ortho Device: Thumb velcro splint Ortho Device/Splint Location: RUE Ortho Device/Splint Interventions: Ordered,Other (comment)   Post Interventions Patient Tolerated: Well Instructions Provided: Care of device   Donald Pore 10/02/2020, 9:45 AM

## 2020-10-02 NOTE — TOC CAGE-AID Note (Signed)
Transition of Care [facility]) - CAGE-AID Screening   Patient Details  Name: BENTLEIGH WAREN MRN: [id number] Date of Birth: 02-22-1966  Clinical Narrative:  Patient endorses current alcohol use, several glasses of wine each week. Denies any need for resources at this time.  CAGE-AID Screening:    Have You Ever Felt You Ought to Cut Down on Your Drinking or Drug Use?: No Have People Annoyed You By Critizing Your Drinking Or Drug Use?: No Have You Felt Bad Or Guilty About Your Drinking Or Drug Use?: No Have You Ever Had a Drink or Used Drugs First Thing In The Morning to Steady Your Nerves or to Get Rid of a Hangover?: No CAGE-AID Score: 0  Substance Abuse Education Offered: No

## 2020-10-02 NOTE — Plan of Care (Signed)
  Problem: Activity: Goal: Risk for activity intolerance will decrease Outcome: Progressing   Problem: Coping: Goal: Level of anxiety will decrease Outcome: Progressing   Problem: Pain Managment: Goal: General experience of comfort will improve Outcome: Progressing   Problem: Safety: Goal: Ability to remain free from injury will improve Outcome: Progressing   Problem: Skin Integrity: Goal: Risk for impaired skin integrity will decrease Outcome: Progressing   

## 2020-10-02 NOTE — Progress Notes (Addendum)
Orthopaedic Trauma Progress Note  SUBJECTIVE: Reports mild pain about operative site. Denies numbness/tingling throughout the arm. Nerve block wearing off.  Notes she is sore all over from her fall.  No chest pain. No SOB. No nausea/vomiting. Notes some bruising and pain over right thumb. Patient had previous ORIF right distal radius by Dr. Melvyn Novas in the past. X-ray of right hand done at time of admission negative for any fractures  OBJECTIVE:  Vitals:   10/01/20 2059 10/02/20 0429  BP: 111/69 115/74  Pulse: 95 92  Resp: 17 16  Temp: 98.5 F (36.9 C) 98.6 F (37 C)  SpO2: 100% 100%    General: Sitting up in bed, NAD Respiratory: No increased work of breathing.  Left Upper Extremity: Dressing clean, dry, intact.  Tolerates small amount of gentle elbow motion. Motor and sensory function intact to the median, ulnar, radial nerve distributions.  Hand warm and well-perfused. Right Upper Extremity: Bruising and swelling over right thumb.  Has some tenderness over the base of the thumb.  Is able to flex and extend but notes some discomfort with this.  Nontender throughout remainder of the hand and wrist.  Wrist flexion/extension is intact.  Motor and sensory function intact.  Neurovascularly at baseline  IMAGING: Stable post op imaging.   LABS:  Results for orders placed or performed during the hospital encounter of 09/29/20 (from the past 24 hour(s))  Urinalysis, Routine w reflex microscopic Urine, Random     Status: Abnormal   Collection Time: 10/01/20 10:48 AM  Result Value Ref Range   Color, Urine YELLOW YELLOW   APPearance CLEAR CLEAR   Specific Gravity, Urine 1.024 1.005 - 1.030   pH 7.0 5.0 - 8.0   Glucose, UA NEGATIVE NEGATIVE mg/dL   Hgb urine dipstick NEGATIVE NEGATIVE   Bilirubin Urine NEGATIVE NEGATIVE   Ketones, ur 20 (A) NEGATIVE mg/dL   Protein, ur NEGATIVE NEGATIVE mg/dL   Nitrite NEGATIVE NEGATIVE   Leukocytes,Ua TRACE (A) NEGATIVE   RBC / HPF 0-5 0 - 5 RBC/hpf    WBC, UA 11-20 0 - 5 WBC/hpf   Bacteria, UA NONE SEEN NONE SEEN  Rapid urine drug screen (hospital performed)     Status: Abnormal   Collection Time: 10/01/20 10:48 AM  Result Value Ref Range   Opiates POSITIVE (A) NONE DETECTED   Cocaine NONE DETECTED NONE DETECTED   Benzodiazepines NONE DETECTED NONE DETECTED   Amphetamines POSITIVE (A) NONE DETECTED   Tetrahydrocannabinol NONE DETECTED NONE DETECTED   Barbiturates NONE DETECTED NONE DETECTED  CBC     Status: Abnormal   Collection Time: 10/02/20  3:55 AM  Result Value Ref Range   WBC 8.3 4.0 - 10.5 K/uL   RBC 3.06 (L) 3.87 - 5.11 MIL/uL   Hemoglobin 10.0 (L) 12.0 - 15.0 g/dL   HCT 71.2 (L) 45.8 - 09.9 %   MCV 99.0 80.0 - 100.0 fL   MCH 32.7 26.0 - 34.0 pg   MCHC 33.0 30.0 - 36.0 g/dL   RDW 83.3 82.5 - 05.3 %   Platelets 178 150 - 400 K/uL   nRBC 0.0 0.0 - 0.2 %  Basic metabolic panel     Status: Abnormal   Collection Time: 10/02/20  3:55 AM  Result Value Ref Range   Sodium 134 (L) 135 - 145 mmol/L   Potassium 3.8 3.5 - 5.1 mmol/L   Chloride 102 98 - 111 mmol/L   CO2 26 22 - 32 mmol/L   Glucose, Bld 138 (H)  70 - 99 mg/dL   BUN 11 6 - 20 mg/dL   Creatinine, Ser 1.85 0.44 - 1.00 mg/dL   Calcium 8.5 (L) 8.9 - 10.3 mg/dL   GFR, Estimated >63 >14 mL/min   Anion gap 6 5 - 15    ASSESSMENT: Rebekah Campbell is a 55 y.o. female, 1 Day Post-Op s/p Procedure(s): 1.  IRRIGATION AND DEBRIDEMENT EXTREMITY 2.  ORIF LEFT DISTAL HUMERUS FRACTURE  CV/Blood loss: Acute blood loss anemia, Hgb 10.0 this morning. Hemodynamically stable  PLAN: Weightbearing: NWB LUE.  Okay for unrestricted passive and active elbow and shoulder range of motion as tolerated Incisional and dressing care: Reinforce dressings as needed  Showering: Okay to shower, keep dressing dry Orthopedic device(s): Sling for comfort  Pain management:  1. Tylenol 1000 mg q 6 hours scheduled 2. Tramadol 50 mg q 6 hours PRN 3. Oxycodone 5-10 mg q 6 hours PRN 5. Morphine  0.5-1 mg q 2 hours PRN VTE prophylaxis: Lovenox, SCDs ID: [id number] 2gm post op Foley/Lines: No foley, KVO IVFs Impediments to Fracture Healing: Vitamin D level looks wonderful at 83, no need for additional supplementation Dispo: PT/OT evaluation today. Have ordered velcro thumb spica splint for right hand to be worn as needed for comfort.  She may come out of the brace for hand motion as she can tolerate..  Likely discharge home in next 24 to 48 hours if pain remains well controlled.  Plan to transition to aspirin 81 mg at discharge for DVT prophylaxis Follow - up plan: 2 weeks after discharge for repeat x-rays and wound check  Contact information:  Truitt Merle MD, Ulyses Southward PA-C. After hours and holidays please check [email or web address] for group call information for [facility]   Johnchristopher Sarvis A. Michaelyn Barter, PA-C [phone] (office) [email or web address]

## 2020-10-03 ENCOUNTER — Encounter (HOSPITAL_COMMUNITY): Payer: Self-pay | Admitting: Student
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
## 2020-10-04 ENCOUNTER — Telehealth: Payer: Self-pay

## 2020-10-04 NOTE — Telephone Encounter (Cosign Needed)
Transition Care Management Unsuccessful Follow-up Telephone Call  Date of discharge and from where:  [facility] 10/02/20  Attempts:  2nd Attempt  Reason for unsuccessful TCM follow-up call:  No answer/busy

## 2020-10-05 ENCOUNTER — Telehealth: Payer: Self-pay

## 2020-10-05 NOTE — Telephone Encounter (Cosign Needed)
Transition Care Management Unsuccessful Follow-up Telephone Call  Date of discharge and from where:  10/02/20 [facility]   Attempts:  3rd Attempt  Reason for unsuccessful TCM follow-up call:  Left voice message

## 2020-10-08 ENCOUNTER — Telehealth: Payer: Self-pay

## 2020-10-08 NOTE — Telephone Encounter (Signed)
Left message to return call to our office at their convenience.  

## 2020-10-08 NOTE — Telephone Encounter (Signed)
Patient Name: [facility] Gender: Female DOB: 06-10-1966 Age: 55 Y 10 M 20 D Return Phone Number: [phone] (Primary), [phone] (Secondary) Address: City/ State/ Zip: [place]  [address] Client [facility] - Human resources officer [facility] Night Physician Jacquiline Doe- MD Contact Type Call Who Is Calling Patient / Member / Family / Caregiver Call Type Triage / Clinical Relationship To Patient Self Return Phone Number [phone] (Primary) Chief Complaint Medication Question (non symptomatic) Reason for Call Symptomatic / Request for Health Information Initial Comment Caller demands to speak with nurse. She had script called to pharmacy and wants to know what it is and who called it in. She had surgery on Tuesday. Translation No Disp. Time Lamount Cohen Time) Disposition Final User 10/05/2020 8:45:40 PM Send To RN Personal Kathlen Mody, RN, Kaila 10/05/2020 8:56:43 PM FINAL ATTEMPT MADE - message left Emiliano Dyer 10/05/2020 8:56:48 PM Send to RN Final Attempt Lenore Manner, RN, Elease Hashimoto 10/06/2020 6:22:53 AM FINAL ATTEMPT MADE - message left Yes Oretha Ellis, RN, Wilfrid Lund

## 2020-10-10 ENCOUNTER — Other Ambulatory Visit: Payer: Self-pay | Admitting: *Deleted

## 2020-10-10 ENCOUNTER — Telehealth: Payer: Self-pay

## 2020-10-10 NOTE — Telephone Encounter (Cosign Needed)
Returned patient call and left voice message concerning call was made as a TCM .

## 2020-10-10 NOTE — Telephone Encounter (Signed)
Patient requesting refills for 3 month

## 2020-10-11 MED ORDER — AMPHETAMINE-DEXTROAMPHETAMINE 30 MG PO TABS: 30.0000 mg | ORAL_TABLET | Freq: Two times a day (BID) | ORAL | 0 refills | Status: DC

## 2020-12-12 ENCOUNTER — Observation Stay (HOSPITAL_COMMUNITY)
Admission: EM | Admit: 2020-12-12 | Discharge: 2020-12-13 | Disposition: A | Discharge status: Home / Self Care | Payer: 59 | Attending: Student | Admitting: Student

## 2020-12-12 ENCOUNTER — Emergency Department (HOSPITAL_COMMUNITY): Payer: 59

## 2020-12-12 ENCOUNTER — Observation Stay (HOSPITAL_COMMUNITY): Payer: 59 | Admitting: Certified Registered Nurse Anesthetist

## 2020-12-12 ENCOUNTER — Observation Stay (HOSPITAL_COMMUNITY): Payer: 59

## 2020-12-12 ENCOUNTER — Other Ambulatory Visit: Payer: Self-pay

## 2020-12-12 ENCOUNTER — Encounter (HOSPITAL_COMMUNITY): Payer: Self-pay | Admitting: Student

## 2020-12-12 ENCOUNTER — Encounter (HOSPITAL_COMMUNITY)
Admission: EM | Disposition: A | Discharge status: Home / Self Care | Payer: Self-pay | Source: Home / Self Care | Attending: Emergency Medicine

## 2020-12-12 DIAGNOSIS — W010XXA Fall on same level from slipping, tripping and stumbling without subsequent striking against object, initial encounter: Secondary | ICD-10-CM | POA: Insufficient documentation

## 2020-12-12 DIAGNOSIS — S42202A Unspecified fracture of upper end of left humerus, initial encounter for closed fracture: Secondary | ICD-10-CM | POA: Diagnosis present

## 2020-12-12 DIAGNOSIS — Z20822 Contact with and (suspected) exposure to covid-19: Secondary | ICD-10-CM | POA: Insufficient documentation

## 2020-12-12 DIAGNOSIS — I10 Essential (primary) hypertension: Secondary | ICD-10-CM | POA: Insufficient documentation

## 2020-12-12 DIAGNOSIS — Y93E5 Activity, floor mopping and cleaning: Secondary | ICD-10-CM | POA: Diagnosis not present

## 2020-12-12 DIAGNOSIS — Z79899 Other long term (current) drug therapy: Secondary | ICD-10-CM | POA: Insufficient documentation

## 2020-12-12 DIAGNOSIS — Z7982 Long term (current) use of aspirin: Secondary | ICD-10-CM | POA: Insufficient documentation

## 2020-12-12 DIAGNOSIS — M9732XA Periprosthetic fracture around internal prosthetic left shoulder joint, initial encounter: Secondary | ICD-10-CM | POA: Diagnosis present

## 2020-12-12 DIAGNOSIS — S42302A Unspecified fracture of shaft of humerus, left arm, initial encounter for closed fracture: Secondary | ICD-10-CM

## 2020-12-12 DIAGNOSIS — S42392A Other fracture of shaft of left humerus, initial encounter for closed fracture: Secondary | ICD-10-CM | POA: Insufficient documentation

## 2020-12-12 DIAGNOSIS — Z419 Encounter for procedure for purposes other than remedying health state, unspecified: Secondary | ICD-10-CM

## 2020-12-12 DIAGNOSIS — T148XXA Other injury of unspecified body region, initial encounter: Secondary | ICD-10-CM

## 2020-12-12 HISTORY — PX: ORIF HUMERUS FRACTURE: SHX2126

## 2020-12-12 LAB — CBC
HCT: 38.3 % (ref 36.0–46.0)
Hemoglobin: 12.7 g/dL (ref 12.0–15.0)
MCH: 32.1 pg (ref 26.0–34.0)
MCHC: 33.2 g/dL (ref 30.0–36.0)
MCV: 96.7 fL (ref 80.0–100.0)
Platelets: 228 10*3/uL (ref 150–400)
RBC: 3.96 MIL/uL (ref 3.87–5.11)
RDW: 12.9 % (ref 11.5–15.5)
WBC: 7 10*3/uL (ref 4.0–10.5)
nRBC: 0 % (ref 0.0–0.2)

## 2020-12-12 LAB — BASIC METABOLIC PANEL
Anion gap: 8 (ref 5–15)
BUN: 11 mg/dL (ref 6–20)
CO2: 27 mmol/L (ref 22–32)
Calcium: 8.6 mg/dL — ABNORMAL LOW (ref 8.9–10.3)
Chloride: 105 mmol/L (ref 98–111)
Creatinine, Ser: 0.48 mg/dL (ref 0.44–1.00)
GFR, Estimated: >60 mL/min (ref >60)
Glucose, Bld: 93 mg/dL (ref 70–99)
Potassium: 3.4 mmol/L — ABNORMAL LOW (ref 3.5–5.1)
Sodium: 140 mmol/L (ref 135–145)

## 2020-12-12 LAB — SARS CORONAVIRUS 2 (TAT 6-24 HRS): SARS Coronavirus 2: NEGATIVE

## 2020-12-12 SURGERY — OPEN REDUCTION INTERNAL FIXATION (ORIF) HUMERAL SHAFT FRACTURE
Anesthesia: General | Laterality: Left

## 2020-12-12 MED ORDER — OXYCODONE HCL 5 MG PO TABS
5.0000 mg | ORAL_TABLET | ORAL | Status: DC | PRN
  Administered 2020-12-13: 10 mg via ORAL
  Administered 2020-12-13: 5 mg via ORAL
  Administered 2020-12-13 (×2): 10 mg via ORAL
  Filled 2020-12-12 (×2): qty 2
  Filled 2020-12-12: qty 1
  Filled 2020-12-12: qty 2

## 2020-12-12 MED ORDER — CHLORHEXIDINE GLUCONATE 0.12 % MT SOLN
OROMUCOSAL | Status: AC
  Administered 2020-12-12: 15 mL via OROMUCOSAL
  Filled 2020-12-12: qty 15

## 2020-12-12 MED ORDER — OXYCODONE HCL 5 MG PO TABS: 5.0000 mg | ORAL_TABLET | Freq: Once | ORAL | Status: DC | PRN

## 2020-12-12 MED ORDER — POLYETHYLENE GLYCOL 3350 17 G PO PACK: 17.0000 g | PACK | Freq: Every day | ORAL | Status: DC | PRN

## 2020-12-12 MED ORDER — ONDANSETRON HCL 4 MG/2ML IJ SOLN
INTRAMUSCULAR | Status: DC | PRN
  Administered 2020-12-12: 4 mg via INTRAVENOUS

## 2020-12-12 MED ORDER — ORAL CARE MOUTH RINSE: 15.0000 mL | Freq: Once | OROMUCOSAL | Status: AC

## 2020-12-12 MED ORDER — DEXAMETHASONE SODIUM PHOSPHATE 10 MG/ML IJ SOLN
INTRAMUSCULAR | Status: AC
  Filled 2020-12-12: qty 1

## 2020-12-12 MED ORDER — ESCITALOPRAM OXALATE 10 MG PO TABS
10.0000 mg | ORAL_TABLET | Freq: Every day | ORAL | Status: DC
  Administered 2020-12-12 – 2020-12-13 (×2): 10 mg via ORAL
  Filled 2020-12-12 (×2): qty 1

## 2020-12-12 MED ORDER — METHOCARBAMOL 1000 MG/10ML IJ SOLN
500.0000 mg | Freq: Four times a day (QID) | INTRAVENOUS | Status: DC | PRN
  Filled 2020-12-12: qty 5

## 2020-12-12 MED ORDER — LIDOCAINE HCL (CARDIAC) PF 100 MG/5ML IV SOSY
PREFILLED_SYRINGE | INTRAVENOUS | Status: DC | PRN
  Administered 2020-12-12: 50 mg via INTRAVENOUS

## 2020-12-12 MED ORDER — ONDANSETRON HCL 4 MG/2ML IJ SOLN
INTRAMUSCULAR | Status: AC
  Filled 2020-12-12: qty 2

## 2020-12-12 MED ORDER — MIDAZOLAM HCL 2 MG/2ML IJ SOLN: 2.0000 mg | Freq: Once | INTRAMUSCULAR | Status: AC

## 2020-12-12 MED ORDER — KETOROLAC TROMETHAMINE 15 MG/ML IJ SOLN
15.0000 mg | Freq: Four times a day (QID) | INTRAMUSCULAR | Status: AC
  Administered 2020-12-12 – 2020-12-13 (×3): 15 mg via INTRAVENOUS
  Filled 2020-12-12 (×4): qty 1

## 2020-12-12 MED ORDER — PROPOFOL 10 MG/ML IV BOLUS
INTRAVENOUS | Status: AC
  Filled 2020-12-12: qty 20

## 2020-12-12 MED ORDER — PANTOPRAZOLE SODIUM 40 MG PO TBEC
80.0000 mg | DELAYED_RELEASE_TABLET | Freq: Every day | ORAL | Status: DC
  Administered 2020-12-12: 80 mg via ORAL
  Filled 2020-12-12 (×2): qty 2

## 2020-12-12 MED ORDER — VANCOMYCIN HCL 1000 MG IV SOLR
INTRAVENOUS | Status: DC | PRN
  Administered 2020-12-12: 1000 mg via TOPICAL

## 2020-12-12 MED ORDER — VASOPRESSIN 20 UNIT/ML IV SOLN
INTRAVENOUS | Status: AC
  Filled 2020-12-12: qty 1

## 2020-12-12 MED ORDER — ACETAMINOPHEN 500 MG PO TABS
500.0000 mg | ORAL_TABLET | Freq: Four times a day (QID) | ORAL | Status: DC
  Administered 2020-12-12: 500 mg via ORAL
  Filled 2020-12-12: qty 1

## 2020-12-12 MED ORDER — METOCLOPRAMIDE HCL 5 MG PO TABS: 5.0000 mg | ORAL_TABLET | Freq: Three times a day (TID) | ORAL | Status: DC | PRN

## 2020-12-12 MED ORDER — HYDROMORPHONE HCL 1 MG/ML IJ SOLN
1.0000 mg | Freq: Once | INTRAMUSCULAR | Status: AC
  Administered 2020-12-12: 1 mg via INTRAVENOUS
  Filled 2020-12-12: qty 1

## 2020-12-12 MED ORDER — OXYCODONE-ACETAMINOPHEN 5-325 MG PO TABS
1.0000 | ORAL_TABLET | Freq: Once | ORAL | Status: AC
  Administered 2020-12-12: 1 via ORAL
  Filled 2020-12-12: qty 1

## 2020-12-12 MED ORDER — SUGAMMADEX SODIUM 200 MG/2ML IV SOLN
INTRAVENOUS | Status: DC | PRN
  Administered 2020-12-12: 200 mg via INTRAVENOUS

## 2020-12-12 MED ORDER — ONDANSETRON HCL 4 MG PO TABS: 4.0000 mg | ORAL_TABLET | Freq: Four times a day (QID) | ORAL | Status: DC | PRN

## 2020-12-12 MED ORDER — FENTANYL CITRATE (PF) 100 MCG/2ML IJ SOLN
INTRAMUSCULAR | Status: AC
  Administered 2020-12-12: 50 ug via INTRAVENOUS
  Filled 2020-12-12: qty 2

## 2020-12-12 MED ORDER — CEFAZOLIN SODIUM-DEXTROSE 2-4 GM/100ML-% IV SOLN
2.0000 g | Freq: Three times a day (TID) | INTRAVENOUS | Status: AC
  Administered 2020-12-12 – 2020-12-13 (×3): 2 g via INTRAVENOUS
  Filled 2020-12-12 (×3): qty 100

## 2020-12-12 MED ORDER — FENTANYL CITRATE (PF) 100 MCG/2ML IJ SOLN: 25.0000 ug | INTRAMUSCULAR | Status: DC | PRN

## 2020-12-12 MED ORDER — ROCURONIUM BROMIDE 10 MG/ML (PF) SYRINGE
PREFILLED_SYRINGE | INTRAVENOUS | Status: DC | PRN
  Administered 2020-12-12: 20 mg via INTRAVENOUS
  Administered 2020-12-12: 50 mg via INTRAVENOUS

## 2020-12-12 MED ORDER — EPHEDRINE SULFATE 50 MG/ML IJ SOLN
INTRAMUSCULAR | Status: DC | PRN
  Administered 2020-12-12: 20 mg via INTRAVENOUS

## 2020-12-12 MED ORDER — CEFAZOLIN SODIUM-DEXTROSE 2-4 GM/100ML-% IV SOLN
INTRAVENOUS | Status: AC
  Filled 2020-12-12: qty 100

## 2020-12-12 MED ORDER — MORPHINE SULFATE (PF) 2 MG/ML IV SOLN
0.5000 mg | INTRAVENOUS | Status: DC | PRN
  Administered 2020-12-13 (×3): 1 mg via INTRAVENOUS
  Filled 2020-12-12 (×3): qty 1

## 2020-12-12 MED ORDER — DOCUSATE SODIUM 100 MG PO CAPS
100.0000 mg | ORAL_CAPSULE | Freq: Two times a day (BID) | ORAL | Status: DC
  Administered 2020-12-12 – 2020-12-13 (×3): 100 mg via ORAL
  Filled 2020-12-12 (×3): qty 1

## 2020-12-12 MED ORDER — BUPIVACAINE-EPINEPHRINE (PF) 0.5% -1:200000 IJ SOLN
INTRAMUSCULAR | Status: DC | PRN
  Administered 2020-12-12: 30 mL via PERINEURAL

## 2020-12-12 MED ORDER — PHENYLEPHRINE 40 MCG/ML (10ML) SYRINGE FOR IV PUSH (FOR BLOOD PRESSURE SUPPORT)
PREFILLED_SYRINGE | INTRAVENOUS | Status: AC
  Filled 2020-12-12: qty 10

## 2020-12-12 MED ORDER — PROPOFOL 10 MG/ML IV BOLUS
INTRAVENOUS | Status: DC | PRN
  Administered 2020-12-12: 150 mg via INTRAVENOUS

## 2020-12-12 MED ORDER — FENTANYL CITRATE (PF) 250 MCG/5ML IJ SOLN
INTRAMUSCULAR | Status: DC | PRN
  Administered 2020-12-12 (×2): 50 ug via INTRAVENOUS

## 2020-12-12 MED ORDER — CHLORHEXIDINE GLUCONATE 0.12 % MT SOLN: 15.0000 mL | Freq: Once | OROMUCOSAL | Status: AC

## 2020-12-12 MED ORDER — FENTANYL CITRATE (PF) 250 MCG/5ML IJ SOLN
INTRAMUSCULAR | Status: AC
  Filled 2020-12-12: qty 5

## 2020-12-12 MED ORDER — FENTANYL CITRATE (PF) 100 MCG/2ML IJ SOLN: 50.0000 ug | Freq: Once | INTRAMUSCULAR | Status: AC

## 2020-12-12 MED ORDER — 0.9 % SODIUM CHLORIDE (POUR BTL) OPTIME
TOPICAL | Status: DC | PRN
  Administered 2020-12-12: 1000 mL

## 2020-12-12 MED ORDER — MIDAZOLAM HCL 2 MG/2ML IJ SOLN
INTRAMUSCULAR | Status: AC
  Filled 2020-12-12: qty 2

## 2020-12-12 MED ORDER — PHENYLEPHRINE HCL (PRESSORS) 10 MG/ML IV SOLN
INTRAVENOUS | Status: DC | PRN
  Administered 2020-12-12 (×4): 80 ug via INTRAVENOUS

## 2020-12-12 MED ORDER — HYDROCODONE-ACETAMINOPHEN 7.5-325 MG PO TABS
1.0000 | ORAL_TABLET | ORAL | Status: DC | PRN
  Administered 2020-12-12: 2 via ORAL
  Filled 2020-12-12: qty 2

## 2020-12-12 MED ORDER — FENTANYL CITRATE (PF) 100 MCG/2ML IJ SOLN
50.0000 ug | Freq: Once | INTRAMUSCULAR | Status: AC
  Administered 2020-12-12: 50 ug via INTRAVENOUS
  Filled 2020-12-12: qty 2

## 2020-12-12 MED ORDER — DEXAMETHASONE SODIUM PHOSPHATE 10 MG/ML IJ SOLN
INTRAMUSCULAR | Status: DC | PRN
  Administered 2020-12-12: 5 mg via INTRAVENOUS

## 2020-12-12 MED ORDER — ROCURONIUM BROMIDE 10 MG/ML (PF) SYRINGE
PREFILLED_SYRINGE | INTRAVENOUS | Status: AC
  Filled 2020-12-12: qty 10

## 2020-12-12 MED ORDER — OXYCODONE HCL 5 MG/5ML PO SOLN
5.0000 mg | Freq: Once | ORAL | Status: DC | PRN
Start: 2020-12-12 — End: 2020-12-12

## 2020-12-12 MED ORDER — ONDANSETRON HCL 4 MG/2ML IJ SOLN: 4.0000 mg | Freq: Four times a day (QID) | INTRAMUSCULAR | Status: DC | PRN

## 2020-12-12 MED ORDER — PROMETHAZINE HCL 25 MG/ML IJ SOLN: 6.2500 mg | INTRAMUSCULAR | Status: DC | PRN

## 2020-12-12 MED ORDER — VASOPRESSIN 20 UNIT/ML IV SOLN
INTRAVENOUS | Status: DC | PRN
  Administered 2020-12-12 (×5): 1 [IU] via INTRAVENOUS

## 2020-12-12 MED ORDER — VANCOMYCIN HCL 1000 MG IV SOLR
INTRAVENOUS | Status: AC
  Filled 2020-12-12: qty 1000

## 2020-12-12 MED ORDER — METOCLOPRAMIDE HCL 5 MG/ML IJ SOLN: 5.0000 mg | Freq: Three times a day (TID) | INTRAMUSCULAR | Status: DC | PRN

## 2020-12-12 MED ORDER — PHENYLEPHRINE HCL-NACL 10-0.9 MG/250ML-% IV SOLN
INTRAVENOUS | Status: DC | PRN
  Administered 2020-12-12: 50 ug/min via INTRAVENOUS

## 2020-12-12 MED ORDER — ACETAMINOPHEN 325 MG PO TABS: 325.0000 mg | ORAL_TABLET | Freq: Four times a day (QID) | ORAL | Status: DC | PRN

## 2020-12-12 MED ORDER — HYDROCODONE-ACETAMINOPHEN 5-325 MG PO TABS: 1.0000 | ORAL_TABLET | ORAL | Status: DC | PRN

## 2020-12-12 MED ORDER — KCL IN DEXTROSE-NACL 20-5-0.45 MEQ/L-%-% IV SOLN
INTRAVENOUS | Status: DC
  Filled 2020-12-12 (×3): qty 1000

## 2020-12-12 MED ORDER — LACTATED RINGERS IV SOLN: INTRAVENOUS | Status: DC

## 2020-12-12 MED ORDER — MIDAZOLAM HCL 2 MG/2ML IJ SOLN
INTRAMUSCULAR | Status: AC
  Administered 2020-12-12: 2 mg via INTRAVENOUS
  Filled 2020-12-12: qty 2

## 2020-12-12 MED ORDER — METHOCARBAMOL 500 MG PO TABS
500.0000 mg | ORAL_TABLET | Freq: Four times a day (QID) | ORAL | Status: DC | PRN
  Administered 2020-12-13: 500 mg via ORAL
  Filled 2020-12-12: qty 1

## 2020-12-12 SURGICAL SUPPLY — 76 items
BIT DRILL 2.5MM SMALL QC EVOS (BIT) ×1 IMPLANT
BIT DRILL QC 2.5MM SHRT EVO SM (DRILL) ×1 IMPLANT
BNDG COHESIVE 4X5 TAN STRL (GAUZE/BANDAGES/DRESSINGS) ×2 IMPLANT
BNDG ELASTIC 4X5.8 VLCR STR LF (GAUZE/BANDAGES/DRESSINGS) ×2 IMPLANT
BNDG ELASTIC 6X5.8 VLCR STR LF (GAUZE/BANDAGES/DRESSINGS) ×2 IMPLANT
BRUSH SCRUB EZ PLAIN DRY (MISCELLANEOUS) IMPLANT
CHLORAPREP W/TINT 26 (MISCELLANEOUS) ×4 IMPLANT
COVER SURGICAL LIGHT HANDLE (MISCELLANEOUS) ×4 IMPLANT
COVER WAND RF STERILE (DRAPES) IMPLANT
DERMABOND ADVANCED (GAUZE/BANDAGES/DRESSINGS) ×1
DERMABOND ADVANCED .7 DNX12 (GAUZE/BANDAGES/DRESSINGS) ×1 IMPLANT
DRAPE C-ARM 42X72 X-RAY (DRAPES) ×2 IMPLANT
DRAPE INCISE IOBAN 66X45 STRL (DRAPES) ×2 IMPLANT
DRAPE ORTHO SPLIT 77X108 STRL (DRAPES) ×2
DRAPE SURG 17X23 STRL (DRAPES) ×2 IMPLANT
DRAPE SURG ORHT 6 SPLT 77X108 (DRAPES) ×2 IMPLANT
DRAPE U-SHAPE 47X51 STRL (DRAPES) ×4 IMPLANT
DRILL 2.5MM SMALL QC EVOS (BIT) ×2
DRILL QC 2.5MM SHORT EVOS SM (DRILL) ×2
DRSG MEPILEX BORDER 4X8 (GAUZE/BANDAGES/DRESSINGS) ×2 IMPLANT
DRSG PAD ABDOMINAL 8X10 ST (GAUZE/BANDAGES/DRESSINGS) IMPLANT
ELECT REM PT RETURN 9FT ADLT (ELECTROSURGICAL) ×2
ELECTRODE REM PT RTRN 9FT ADLT (ELECTROSURGICAL) ×1 IMPLANT
EVACUATOR 1/8 PVC DRAIN (DRAIN) IMPLANT
GLOVE BIO SURGEON STRL SZ 6.5 (GLOVE) ×6 IMPLANT
GLOVE BIO SURGEON STRL SZ7.5 (GLOVE) ×8 IMPLANT
GLOVE BIOGEL PI IND STRL 7.5 (GLOVE) ×1 IMPLANT
GLOVE BIOGEL PI INDICATOR 7.5 (GLOVE) ×1
GLOVE SURG UNDER POLY LF SZ6.5 (GLOVE) ×2 IMPLANT
GOWN STRL REUS W/ TWL LRG LVL3 (GOWN DISPOSABLE) ×2 IMPLANT
GOWN STRL REUS W/TWL LRG LVL3 (GOWN DISPOSABLE) ×2
K-WIRE 1.6 (WIRE) ×1
K-WIRE FX150X1.6XTROC PNT (WIRE) ×1
KIT BASIN OR (CUSTOM PROCEDURE TRAY) ×2 IMPLANT
KIT TURNOVER KIT B (KITS) ×2 IMPLANT
KWIRE FX150X1.6XTROC PNT (WIRE) ×1 IMPLANT
MANIFOLD NEPTUNE II (INSTRUMENTS) ×2 IMPLANT
NEEDLE HYPO 25X1 1.5 SAFETY (NEEDLE) IMPLANT
NS IRRIG 1000ML POUR BTL (IV SOLUTION) ×2 IMPLANT
PACK ORTHO EXTREMITY (CUSTOM PROCEDURE TRAY) ×2 IMPLANT
PAD ARMBOARD 7.5X6 YLW CONV (MISCELLANEOUS) ×4 IMPLANT
PAD CAST 4YDX4 CTTN HI CHSV (CAST SUPPLIES) ×1 IMPLANT
PADDING CAST COTTON 4X4 STRL (CAST SUPPLIES) ×1
PADDING CAST COTTON 6X4 STRL (CAST SUPPLIES) ×2 IMPLANT
PLATE PROX HUM CRVD 3.5X12 LT (Plate) ×2 IMPLANT
SCREW CORT 3.5X20 ST EVOS (Screw) ×4 IMPLANT
SCREW CORT 3.5X22 ST EVOS (Screw) ×2 IMPLANT
SCREW CORT 3.5X30 ST EVOS (Screw) ×2 IMPLANT
SCREW CORT EVOS ST 3.5X28 (Screw) ×6 IMPLANT
SCREW CORTEX 3.5X26 (Screw) ×2 IMPLANT
SCREW LOCK 28X3.5XSTSTRL (Screw) ×1 IMPLANT
SCREW LOCK 46X3.5XST STRL (Screw) ×1 IMPLANT
SCREW LOCK ST 3.5X46 (Screw) ×1 IMPLANT
SCREW LOCK ST 3.5X48 (Screw) ×2 IMPLANT
SCREW LOCK ST EVOS 3.5X38 (Screw) ×2 IMPLANT
SCREW LOCK ST EVOS 3.5X40 (Screw) ×2 IMPLANT
SCREW LOCKING 3.5X28 (Screw) ×1 IMPLANT
SLING ARM IMMOBILIZER LRG (SOFTGOODS) ×2 IMPLANT
SPONGE LAP 18X18 RF (DISPOSABLE) ×2 IMPLANT
STAPLER VISISTAT 35W (STAPLE) IMPLANT
SUCTION FRAZIER HANDLE 10FR (MISCELLANEOUS) ×1
SUCTION TUBE FRAZIER 10FR DISP (MISCELLANEOUS) ×1 IMPLANT
SUT ETHILON 3 0 PS 1 (SUTURE) IMPLANT
SUT MNCRL AB 3-0 PS2 18 (SUTURE) IMPLANT
SUT PROLENE 0 CT (SUTURE) IMPLANT
SUT VIC AB 0 CT1 27 (SUTURE)
SUT VIC AB 0 CT1 27XBRD ANBCTR (SUTURE) IMPLANT
SUT VIC AB 2-0 CT1 27 (SUTURE)
SUT VIC AB 2-0 CT1 TAPERPNT 27 (SUTURE) IMPLANT
SYR CONTROL 10ML LL (SYRINGE) IMPLANT
TOWEL GREEN STERILE (TOWEL DISPOSABLE) ×6 IMPLANT
TOWEL GREEN STERILE FF (TOWEL DISPOSABLE) ×2 IMPLANT
TRAY FOLEY MTR SLVR 16FR STAT (SET/KITS/TRAYS/PACK) IMPLANT
TUBE CONNECTING 12X1/4 (SUCTIONS) ×2 IMPLANT
WATER STERILE IRR 1000ML POUR (IV SOLUTION) IMPLANT
YANKAUER SUCT BULB TIP NO VENT (SUCTIONS) IMPLANT

## 2020-12-12 NOTE — Anesthesia Procedure Notes (Signed)
Procedure Name: Intubation Date/Time: 12/12/2020 10:33 AM Performed by: Cleda Clarks, CRNA Pre-anesthesia Checklist: Patient identified, Emergency Drugs available, Suction available and Patient being monitored Patient Re-evaluated:Patient Re-evaluated prior to induction Oxygen Delivery Method: Circle system utilized Preoxygenation: Pre-oxygenation with 100% oxygen Induction Type: IV induction Ventilation: Mask ventilation without difficulty Laryngoscope Size: Miller and 2 Grade View: Grade II Tube type: Oral Tube size: 7.0 mm Number of attempts: 1 Airway Equipment and Method: Stylet and Oral airway Placement Confirmation: ETT inserted through vocal cords under direct vision, positive ETCO2 and breath sounds checked- equal and bilateral Secured at: 21 cm Tube secured with: Tape Dental Injury: Teeth and Oropharynx as per pre-operative assessment

## 2020-12-12 NOTE — Transfer of Care (Signed)
Immediate Anesthesia Transfer of Care Note  Patient: Rebekah Campbell  Procedure(s) Performed: OPEN REDUCTION INTERNAL FIXATION (ORIF) HUMERAL SHAFT FRACTURE (Left)  Patient Location: PACU  Anesthesia Type:General  Level of Consciousness: awake, alert  and sedated  Airway & Oxygen Therapy: Patient Spontanous Breathing and Patient connected to face mask oxygen  Post-op Assessment: Report given to RN and Post -op Vital signs reviewed and stable  Post vital signs: Reviewed and stable  Last Vitals:  Vitals Value Taken Time  BP 124/78 12/12/20 1226  Temp    Pulse 83 12/12/20 1227  Resp 22 12/12/20 1227  SpO2 100 % 12/12/20 1227  Vitals shown include unvalidated device data.  Last Pain:  Vitals:   12/12/20 0948  TempSrc:   PainSc: 10-Worst pain ever         Complications: No notable events documented.
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
## 2020-12-12 NOTE — Discharge Instructions (Signed)
Orthopaedic Trauma Service Discharge Instructions   General Discharge Instructions  WEIGHT BEARING STATUS: Non-weightbearing left upper extremity  RANGE OF MOTION/ACTIVITY: Ok for shoulder and elbow motion as tolerated. Wear your sling as needed for comfort  Wound Care: You may remove your surgical dressing on post-op day #2 (Friday 12/14/20). Incisions can be left open to air if there is no drainage. If incision continues to have drainage, follow wound care instructions below. Okay to shower if no drainage from incisions.  DVT/PE prophylaxis: Aspirin  Diet: as you were eating previously.  Can use over the counter stool softeners and bowel preparations, such as Miralax, to help with bowel movements.  Narcotics can be constipating.  Be sure to drink plenty of fluids  PAIN MEDICATION USE AND EXPECTATIONS  You have likely been given narcotic medications to help control your pain.  After a traumatic event that results in an fracture (broken bone) with or without surgery, it is ok to use narcotic pain medications to help control one's pain.  We understand that everyone responds to pain differently and each individual patient will be evaluated on a regular basis for the continued need for narcotic medications. Ideally, narcotic medication use should last no more than 6-8 weeks (coinciding with fracture healing).   As a patient it is your responsibility as well to monitor narcotic medication use and report the amount and frequency you use these medications when you come to your office visit.   We would also advise that if you are using narcotic medications, you should take a dose prior to therapy to maximize you participation.  IF YOU ARE ON NARCOTIC MEDICATIONS IT IS NOT PERMISSIBLE TO OPERATE A MOTOR VEHICLE (MOTORCYCLE/CAR/TRUCK/MOPED) OR HEAVY MACHINERY DO NOT MIX NARCOTICS WITH OTHER CNS (CENTRAL NERVOUS SYSTEM) DEPRESSANTS SUCH AS ALCOHOL   STOP SMOKING OR USING NICOTINE PRODUCTS!!!!  As  discussed nicotine severely impairs your body's ability to heal surgical and traumatic wounds but also impairs bone healing.  Wounds and bone heal by forming microscopic blood vessels (angiogenesis) and nicotine is a vasoconstrictor (essentially, shrinks blood vessels).  Therefore, if vasoconstriction occurs to these microscopic blood vessels they essentially disappear and are unable to deliver necessary nutrients to the healing tissue.  This is one modifiable factor that you can do to dramatically increase your chances of healing your injury.    (This means no smoking, no nicotine gum, patches, etc)  DO NOT USE NONSTEROIDAL ANTI-INFLAMMATORY DRUGS (NSAID'S)  Using products such as Advil (ibuprofen), Aleve (naproxen), Motrin (ibuprofen) for additional pain control during fracture healing can delay and/or prevent the healing response.  If you would like to take over the counter (OTC) medication, Tylenol (acetaminophen) is ok.  However, some narcotic medications that are given for pain control contain acetaminophen as well. Therefore, you should not exceed more than 4000 mg of tylenol in a day if you do not have liver disease.  Also note that there are may OTC medicines, such as cold medicines and allergy medicines that my contain tylenol as well.  If you have any questions about medications and/or interactions please ask your doctor/PA or your pharmacist.      ICE AND ELEVATE INJURED/OPERATIVE EXTREMITY  Using ice and elevating the injured extremity above your heart can help with swelling and pain control.  Icing in a pulsatile fashion, such as 20 minutes on and 20 minutes off, can be followed.    Do not place ice directly on skin. Make sure there is a barrier between  to skin and the ice pack.    Using frozen items such as frozen peas works well as the conform nicely to the are that needs to be iced.  USE AN ACE WRAP OR TED HOSE FOR SWELLING CONTROL  In addition to icing and elevation, Ace wraps or TED  hose are used to help limit and resolve swelling.  It is recommended to use Ace wraps or TED hose until you are informed to stop.    When using Ace Wraps start the wrapping distally (farthest away from the body) and wrap proximally (closer to the body)   Example: If you had surgery on your leg or thing and you do not have a splint on, start the ace wrap at the toes and work your way up to the thigh        If you had surgery on your upper extremity and do not have a splint on, start the ace wrap at your fingers and work your way up to the upper arm  CALL THE OFFICE WITH ANY QUESTIONS OR CONCERNS: [phone]   VISIT OUR WEBSITE FOR ADDITIONAL INFORMATION: [email or web address]    Discharge Wound Care Instructions  Do NOT apply any ointments, solutions or lotions to pin sites or surgical wounds.  These prevent needed drainage and even though solutions like hydrogen peroxide kill bacteria, they also damage cells lining the pin sites that help fight infection.  Applying lotions or ointments can keep the wounds moist and can cause them to breakdown and open up as well. This can increase the risk for infection. When in doubt call the office.  If any drainage is noted, use one layer of adaptic, then gauze, Kerlix, and an ace wrap.  Once the incision is completely dry and without drainage, it may be left open to air out.  Showering may begin 36-48 hours later.  Cleaning gently with soap and water.
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
## 2020-12-12 NOTE — Anesthesia Procedure Notes (Signed)
Anesthesia Regional Block: Interscalene brachial plexus block   Pre-Anesthetic Checklist: , timeout performed,  Correct Patient, Correct Site, Correct Laterality,  Correct Procedure, Correct Position, site marked,  Risks and benefits discussed,  Surgical consent,  Pre-op evaluation,  At surgeon's request and post-op pain management  Laterality: Left  Prep: chloraprep       Needles:  Injection technique: Single-shot  Needle Type: Echogenic Needle     Needle Length: 5cm  Needle Gauge: 21     Additional Needles:   Narrative:  Start time: 12/12/2020 10:06 AM End time: 12/12/2020 10:10 AM Injection made incrementally with aspirations every 5 mL.  Performed by: Personally  Anesthesiologist: Beryle Lathe, MD  Additional Notes: No pain on injection. No increased resistance to injection. Injection made in 5cc increments. Good needle visualization. Patient tolerated the procedure well.

## 2020-12-12 NOTE — H&P (Signed)
Rebekah Campbell is an 55 y.o. female.   Chief Complaint: Left periprosthetic humerus fx HPI: Rebekah Campbell was cleaning at home when she bent over to pick something up and lost her balance. She fell and had immediate left arm pain. She was brought to the ED where x-rays showed a humerus fx above the plate she had put on 2 months ago by Dr. Jena Gauss. She is RHD.  Past Medical History:  Diagnosis Date   ADHD (attention deficit hyperactivity disorder)    Depression    HTN (hypertension)    Inguinal hernia of right side with obstruction and without gangrene     Past Surgical History:  Procedure Laterality Date    IRRIGATION AND DEBRIDEMENT EXTR IRRIGATION AND DEBRIDEMENT EXTREMITY (Left )EMITY (Left )  10/01/2020   HERNIA REPAIR     I & D EXTREMITY Left 10/01/2020   Procedure: IRRIGATION AND DEBRIDEMENT EXTREMITY;  Surgeon: Roby Lofts, MD;  Location: [facility] OR;  Service: Orthopedics;  Laterality: Left;   OPEN REDUCTION INTERNAL FIXATION (ORIF) DISTAL HUMERUS FRACTURE (Left )  10/01/2020   ORIF HUMERUS FRACTURE Left 10/01/2020   Procedure: OPEN REDUCTION INTERNAL FIXATION (ORIF) DISTAL HUMERUS FRACTURE;  Surgeon: Roby Lofts, MD;  Location: [facility] OR;  Service: Orthopedics;  Laterality: Left;   TONSILLECTOMY      Family History  Problem Relation Age of Onset   Anesthesia problems Neg Hx    Social History:  reports that she has never smoked. She has never used smokeless tobacco. She reports current alcohol use of about 3.0 - 5.0 standard drinks of alcohol per week. She reports that she does not use drugs.  Allergies:  Allergies  Allergen Reactions   Codeine Nausea And Vomiting    (Not in a hospital admission)   No results found for this or any previous visit (from the past 48 hour(s)). DG Humerus Left  Result Date: 12/12/2020 CLINICAL DATA:  Left arm pain after a fall. EXAM: LEFT HUMERUS - 2+ VIEW COMPARISON:  09/29/2020 FINDINGS: Interval plate and screw fixation across a healing fracture  of the mid/distal shaft of the left humerus. There is a new oblique fracture of the proximal shaft of the left humerus with fracture lines extending to the top of the plate. There is half shaft width medial displacement of the fracture fragments. IMPRESSION: New acute fracture of the proximal left humerus shaft. Postoperative plate and screw fixation of a healing fracture of the distal left humerus shaft. Electronically Signed   By: Burman Nieves M.D.   On: 12/12/2020 01:49    Review of Systems  HENT:  Negative for ear discharge, ear pain, hearing loss and tinnitus.   Eyes:  Negative for photophobia and pain.  Respiratory:  Negative for cough and shortness of breath.   Cardiovascular:  Negative for chest pain.  Gastrointestinal:  Negative for abdominal pain, nausea and vomiting.  Genitourinary:  Negative for dysuria, flank pain, frequency and urgency.  Musculoskeletal:  Positive for arthralgias (Left upper arm). Negative for back pain, myalgias and neck pain.  Neurological:  Negative for dizziness and headaches.  Hematological:  Does not bruise/bleed easily.  Psychiatric/Behavioral:  The patient is not nervous/anxious.    Blood pressure 139/89, pulse 74, temperature 98.1 F (36.7 C), temperature source Oral, resp. rate 16, height 5\' 5"  (1.651 m), weight 59 kg, SpO2 97 %. Physical Exam Constitutional:      General: She is not in acute distress.    Appearance: She is well-developed. She is not  diaphoretic.  HENT:     Head: Normocephalic and atraumatic.  Eyes:     General: No scleral icterus.       Right eye: No discharge.        Left eye: No discharge.     Conjunctiva/sclera: Conjunctivae normal.  Cardiovascular:     Rate and Rhythm: Normal rate and regular rhythm.  Pulmonary:     Effort: Pulmonary effort is normal. No respiratory distress.  Musculoskeletal:     Cervical back: Normal range of motion.     Comments: Left  shoulder, elbow, wrist, digits- no skin wounds, mod TTP upper  arm, no instability, no blocks to motion  Sens  Ax/R/M/U intact  Mot   Ax/ R/ PIN/ M/ AIN/ U intact  Rad 2+  Skin:    General: Skin is warm and dry.  Neurological:     Mental Status: She is alert.  Psychiatric:        Mood and Affect: Mood normal.        Behavior: Behavior normal.     Assessment/Plan Left periprosthetic humerus fx -- For ORIF later this morning by Dr. Jena Gauss. Please keep NPO.    Freeman Caldron, PA-C Orthopedic Surgery [phone] 12/12/2020, 9:15 AM

## 2020-12-12 NOTE — Anesthesia Postprocedure Evaluation (Signed)
Anesthesia Post Note  Patient: Rebekah Campbell  Procedure(s) Performed: OPEN REDUCTION INTERNAL FIXATION (ORIF) HUMERAL SHAFT FRACTURE (Left)     Patient location during evaluation: PACU Anesthesia Type: General Level of consciousness: awake and alert Pain management: pain level controlled Vital Signs Assessment: post-procedure vital signs reviewed and stable Respiratory status: spontaneous breathing, nonlabored ventilation, respiratory function stable and patient connected to nasal cannula oxygen Cardiovascular status: blood pressure returned to baseline and stable Postop Assessment: no apparent nausea or vomiting Anesthetic complications: no   No notable events documented.  Last Vitals:  Vitals:   12/12/20 1256 12/12/20 1317  BP: 114/78 116/77  Pulse: 92 93  Resp: 20   Temp: 36.6 C 36.8 C  SpO2: 93% 93%    Last Pain:  Vitals:   12/12/20 1317  TempSrc: Oral  PainSc:                  Beryle Lathe

## 2020-12-12 NOTE — Progress Notes (Signed)
Orthopedic Tech Progress Note Patient Details:  Rebekah Campbell 1966-04-12 [id number]  Ortho Devices Type of Ortho Device: Coapt, Sling immobilizer Ortho Device/Splint Location: Left Upper Extremity Ortho Device/Splint Interventions: Ordered, Application (pt did not want sling applied at the time of intervention)   Post Interventions Patient Tolerated: Well Instructions Provided: Care of device, Poper ambulation with device  Deaveon Schoen P Harle Stanford 12/12/2020, 4:21 AM

## 2020-12-12 NOTE — ED Provider Notes (Signed)
[facility] EMERGENCY DEPARTMENT Provider Note   CSN: [id number] Arrival date & time: 12/12/20  0035     History Chief Complaint  Patient presents with   [facility] Rebekah Campbell is a 55 y.o. female.  Presents to ER with concern for fall.  Patient states that she fell on hard floor and landed on her left arm.  Is having severe 10 out of 10 sharp stabbing pain in her left upper arm.  Had distal humeral shaft fracture a couple months ago that required surgery.  HPI     Past Medical History:  Diagnosis Date   ADHD (attention deficit hyperactivity disorder)    Depression    HTN (hypertension)    Inguinal hernia of right side with obstruction and without gangrene     Patient Active Problem List   Diagnosis Date Noted   Open fracture of left distal humerus 09/30/2020   Open displaced segmental fracture of shaft of left humerus 09/29/2020   Other displaced fracture of lower end of left humerus, initial encounter for open fracture 09/29/2020   Anxiety 08/14/2020   Essential hypertension 11/27/2017   Low vitamin D level 11/27/2017   Arthritis of carpometacarpal (CMC) joint of left thumb 10/27/2017   TFCC (triangular fibrocartilage complex) injury, left, initial encounter 10/02/2017   Degenerative tear of glenoid labrum of left shoulder 08/28/2017   Inguinal hernia of right side without obstruction or gangrene 09/29/2016   Elevated AST (SGOT) 07/30/2016   Attention deficit hyperactivity disorder (ADHD), predominantly inattentive type 01/03/2016   Recurrent major depressive disorder, in full remission (HCC) 01/03/2016   Insomnia 09/21/2015    Past Surgical History:  Procedure Laterality Date    IRRIGATION AND DEBRIDEMENT EXTR IRRIGATION AND DEBRIDEMENT EXTREMITY (Left )EMITY (Left )  10/01/2020   HERNIA REPAIR     I & D EXTREMITY Left 10/01/2020   Procedure: IRRIGATION AND DEBRIDEMENT EXTREMITY;  Surgeon: Roby Lofts, MD;  Location: [facility] OR;  Service:  Orthopedics;  Laterality: Left;   OPEN REDUCTION INTERNAL FIXATION (ORIF) DISTAL HUMERUS FRACTURE (Left )  10/01/2020   ORIF HUMERUS FRACTURE Left 10/01/2020   Procedure: OPEN REDUCTION INTERNAL FIXATION (ORIF) DISTAL HUMERUS FRACTURE;  Surgeon: Roby Lofts, MD;  Location: [facility] OR;  Service: Orthopedics;  Laterality: Left;   TONSILLECTOMY       OB History   No obstetric history on file.     Family History  Problem Relation Age of Onset   Anesthesia problems Neg Hx     Social History   Tobacco Use   Smoking status: Never   Smokeless tobacco: Never  Substance Use Topics   Alcohol use: Yes    Alcohol/week: 3.0 - 5.0 standard drinks    Types: 3 - 5 Glasses of wine per week   Drug use: No    Home Medications Prior to Admission medications   Medication Sig Start Date End Date Taking? Authorizing Provider  amphetamine-dextroamphetamine (ADDERALL) 30 MG tablet Take 1 tablet by mouth 2 (two) times daily. Patient taking differently: Take 30 mg by mouth daily. 12/10/20  Yes Ardith Dark, MD  cholecalciferol (VITAMIN D3) 25 MCG (1000 UNIT) tablet Take 1,000 Units by mouth daily.   Yes [provider]  escitalopram (LEXAPRO) 10 MG tablet Take 10 mg by mouth daily. 11/13/20  Yes [provider]  Ginkgo Biloba Extract 60 MG CAPS Take 60 mg by mouth daily.   Yes [provider]  Glucosamine-Chondroit-Vit C-Mn (GLUCOSAMINE 1500 COMPLEX  PO) Take 2 tablets by mouth daily.   Yes [provider]  hydrochlorothiazide (HYDRODIURIL) 25 MG tablet TAKE 1 TABLET(25 MG) BY MOUTH DAILY Patient taking differently: Take 25 mg by mouth daily as needed (swelling). 07/27/20  Yes Ardith Dark, MD  Milk Thistle 140 MG CAPS Take 140 mg by mouth daily.   Yes [provider]  Omega-3 Fatty Acids (FISH OIL) 1000 MG CAPS Take 1,000 mg by mouth daily.   Yes [provider]  omeprazole (PRILOSEC) 40 MG capsule Take 40 mg by mouth daily. 09/25/20  Yes [provider]  ondansetron (ZOFRAN) 4 MG tablet Take 1 tablet (4 mg total) by mouth every 6 (six) hours as needed for nausea. 10/02/20  Yes Despina Hidden, PA-C  oxyCODONE (OXY IR/ROXICODONE) 5 MG immediate release tablet Take 1-2 tablets (5-10 mg total) by mouth every 6 (six) hours as needed for severe pain. 10/02/20  Yes Despina Hidden, PA-C  vitamin E 1000 UNIT capsule Take 1,000 Units by mouth daily.   Yes [provider]  aspirin EC 81 MG tablet Take 1 tablet (81 mg total) by mouth daily. Swallow whole. Patient not taking: No sig reported 10/02/20   Despina Hidden, PA-C  docusate sodium (COLACE) 100 MG capsule Take 1 capsule (100 mg total) by mouth 2 (two) times daily. Patient not taking: No sig reported 10/02/20   Despina Hidden, PA-C    Allergies    Codeine  Review of Systems   Review of Systems  Constitutional:  Negative for chills and fever.  HENT:  Negative for ear pain and sore throat.   Eyes:  Negative for pain and visual disturbance.  Respiratory:  Negative for cough and shortness of breath.   Cardiovascular:  Negative for chest pain and palpitations.  Gastrointestinal:  Negative for abdominal pain and vomiting.  Genitourinary:  Negative for dysuria and hematuria.  Musculoskeletal:  Positive for arthralgias. Negative for back pain.  Skin:  Negative for color change and rash.  Neurological:  Negative for seizures and syncope.  All other systems reviewed and are negative.  Physical Exam Updated Vital Signs BP 139/89   Pulse 74   Temp 98.1 F (36.7 C) (Oral)   Resp 16   Ht 5\' 5"  (1.651 m)   Wt 59 kg   SpO2 97%   BMI 21.63 kg/m   Physical Exam Vitals and nursing note reviewed.  Constitutional:      General: She is not in acute distress.    Appearance: She is well-developed.  HENT:     Head: Normocephalic and atraumatic.  Eyes:     Conjunctiva/sclera: Conjunctivae normal.  Cardiovascular:     Rate and Rhythm: Normal rate and regular rhythm.     Heart  sounds: No murmur heard. Pulmonary:     Effort: Pulmonary effort is normal. No respiratory distress.     Breath sounds: Normal breath sounds.  Abdominal:     Palpations: Abdomen is soft.     Tenderness: There is no abdominal tenderness.  Musculoskeletal:     Cervical back: Neck supple.     Comments: Left upper extremity: There is tenderness to palpation in the mid humerus, distal radial pulse intact, sensation and motor intact  Skin:    General: Skin is warm and dry.  Neurological:     Mental Status: She is alert.    ED Results / Procedures / Treatments   Labs (all labs ordered are listed, but only abnormal results  are displayed) Labs Reviewed - No data to display  EKG None  Radiology DG Humerus Left  Result Date: 12/12/2020 CLINICAL DATA:  Left arm pain after a fall. EXAM: LEFT HUMERUS - 2+ VIEW COMPARISON:  09/29/2020 FINDINGS: Interval plate and screw fixation across a healing fracture of the mid/distal shaft of the left humerus. There is a new oblique fracture of the proximal shaft of the left humerus with fracture lines extending to the top of the plate. There is half shaft width medial displacement of the fracture fragments. IMPRESSION: New acute fracture of the proximal left humerus shaft. Postoperative plate and screw fixation of a healing fracture of the distal left humerus shaft. Electronically Signed   By: Burman Nieves M.D.   On: 12/12/2020 01:49    Procedures Procedures   Medications Ordered in ED Medications  fentaNYL (SUBLIMAZE) injection 50 mcg (50 mcg Intravenous Given 12/12/20 0107)  HYDROmorphone (DILAUDID) injection 1 mg (1 mg Intravenous Given 12/12/20 0204)  fentaNYL (SUBLIMAZE) injection 50 mcg (50 mcg Intravenous Given 12/12/20 0335)  oxyCODONE-acetaminophen (PERCOCET/ROXICET) 5-325 MG per tablet 1 tablet (1 tablet Oral Given 12/12/20 7341)    ED Course  I have reviewed the triage vital signs and the nursing notes.  Pertinent labs & imaging results  that were available during my care of the patient were reviewed by me and considered in my medical decision making (see chart for details).    MDM Rules/Calculators/A&P                          55 year old lady presenting to ER with concern for fall, left upper arm pain.  Dr. Jena Gauss had performed plate and screw fixation of humeral fracture in April.  X-ray today shows new acute fracture of the proximal left humerus shaft.  I discussed with Dr. Carola Frost.  He recommends placing patient in a coapt splint.  He will discuss case with Dr. Jena Gauss in the morning to finalize plan.  Anticipate either admission for ORIF or potentially discharge to get surgery in the outpatient setting.  While awaiting plan from Ortho, signed out to Dr. Jeraldine Loots.  Final Clinical Impression(s) / ED Diagnoses Final diagnoses:  Closed fracture of shaft of left humerus, unspecified fracture morphology, initial encounter    Rx / DC Orders ED Discharge Orders     None        Milagros Loll, MD 12/12/20 [phone]

## 2020-12-12 NOTE — Progress Notes (Signed)
Orthopedic Tech Progress Note Patient Details:  Rebekah Campbell Feb 10, 1966 [id number] Pt declined arm sling, says she has plenty at home. Patient ID: Rebekah Campbell, female   DOB: 16-Apr-1966, 55 y.o.   MRN: [id number]  Gerald Stabs 12/12/2020, 5:23 AM

## 2020-12-12 NOTE — Anesthesia Preprocedure Evaluation (Addendum)
Anesthesia Evaluation  Patient identified by MRN, date of birth, ID band Patient awake    Reviewed: Allergy & Precautions, NPO status , Patient's Chart, lab work & pertinent test results  History of Anesthesia Complications Negative for: history of anesthetic complications  Airway Mallampati: II  TM Distance: >3 FB Neck ROM: Full    Dental  (+) Dental Advisory Given   Pulmonary neg pulmonary ROS,    Pulmonary exam normal        Cardiovascular hypertension, Pt. on medications Normal cardiovascular exam     Neuro/Psych PSYCHIATRIC DISORDERS Anxiety Depression  Insomnia negative neurological ROS     GI/Hepatic negative GI ROS, Neg liver ROS,   Endo/Other  negative endocrine ROS  Renal/GU negative Renal ROS     Musculoskeletal  (+) Arthritis ,   Abdominal   Peds  (+) ADHD Hematology negative hematology ROS (+)   Anesthesia Other Findings   Reproductive/Obstetrics                            Anesthesia Physical Anesthesia Plan  ASA: 2  Anesthesia Plan: General   Post-op Pain Management:  Regional for Post-op pain   Induction: Intravenous  PONV Risk Score and Plan: 3 and Treatment may vary due to age or medical condition, Ondansetron, Dexamethasone and Midazolam  Airway Management Planned: Oral ETT  Additional Equipment: None  Intra-op Plan:   Post-operative Plan: Extubation in OR  Informed Consent: I have reviewed the patients History and Physical, chart, labs and discussed the procedure including the risks, benefits and alternatives for the proposed anesthesia with the patient or authorized representative who has indicated his/her understanding and acceptance.     Dental advisory given  Plan Discussed with: CRNA, Anesthesiologist and Surgeon  Anesthesia Plan Comments:        Anesthesia Quick Evaluation

## 2020-12-12 NOTE — ED Notes (Signed)
Patient transported to X-ray 

## 2020-12-12 NOTE — ED Notes (Signed)
Pt placed on 3L [place] due to o2 sats 84% while sleeping.
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
## 2020-12-13 LAB — CBC
HCT: 32.5 % — ABNORMAL LOW (ref 36.0–46.0)
Hemoglobin: 10.7 g/dL — ABNORMAL LOW (ref 12.0–15.0)
MCH: 32.6 pg (ref 26.0–34.0)
MCHC: 32.9 g/dL (ref 30.0–36.0)
MCV: 99.1 fL (ref 80.0–100.0)
Platelets: 218 10*3/uL (ref 150–400)
RBC: 3.28 MIL/uL — ABNORMAL LOW (ref 3.87–5.11)
RDW: 13.2 % (ref 11.5–15.5)
WBC: 9.7 10*3/uL (ref 4.0–10.5)
nRBC: 0 % (ref 0.0–0.2)

## 2020-12-13 LAB — BASIC METABOLIC PANEL
Anion gap: 9 (ref 5–15)
BUN: 14 mg/dL (ref 6–20)
CO2: 27 mmol/L (ref 22–32)
Calcium: 8 mg/dL — ABNORMAL LOW (ref 8.9–10.3)
Chloride: 98 mmol/L (ref 98–111)
Creatinine, Ser: 0.6 mg/dL (ref 0.44–1.00)
GFR, Estimated: >60 mL/min (ref >60)
Glucose, Bld: 127 mg/dL — ABNORMAL HIGH (ref 70–99)
Potassium: 3.8 mmol/L (ref 3.5–5.1)
Sodium: 134 mmol/L — ABNORMAL LOW (ref 135–145)

## 2020-12-13 MED ORDER — OXYCODONE HCL 5 MG PO TABS: 5.0000 mg | ORAL_TABLET | Freq: Four times a day (QID) | ORAL | 0 refills | Status: DC | PRN

## 2020-12-13 MED ORDER — OXYCODONE-ACETAMINOPHEN 5-325 MG PO TABS: 1.0000 | ORAL_TABLET | ORAL | 0 refills | Status: DC | PRN

## 2020-12-13 MED ORDER — METHOCARBAMOL 500 MG PO TABS: 500.0000 mg | ORAL_TABLET | Freq: Four times a day (QID) | ORAL | 0 refills | Status: DC | PRN

## 2020-12-13 NOTE — Progress Notes (Signed)
Discharge summary packet provided to pt wit instructions. Pt verbalized understanding of instructions. No complaints voiced. Pt d/c to home as ordered, Pt  remains alert/oriented  in no apparent distress. Pt's spouse is responsible for her ride.

## 2020-12-13 NOTE — Discharge Summary (Signed)
Orthopaedic Trauma Service (OTS) Discharge Summary   Patient ID: Rebekah Campbell MRN: [id number] DOB/AGE: 07/29/1965 55 y.o.  Admit date: 12/12/2020 Discharge date: 12/13/2020  Admission Diagnoses: Left periprosthetic humerus fracture   Discharge Diagnoses:  Active Problems:   Closed fracture of left proximal humerus   Past Medical History:  Diagnosis Date   ADHD (attention deficit hyperactivity disorder)    Depression    HTN (hypertension)    Inguinal hernia of right side with obstruction and without gangrene      Procedures Performed: ORIF left humerus  Discharged Condition: stable  Hospital Course: Patient presented to [facility] emergency department on 12/12/2020 after sustaining fall and landing on left arm.  Was found to have a fracture just proximal to her humeral hardware.  Patient was placed in a coaptation splint in the emergency department and orthopedics was consulted for evaluation and management.  Patient was taken to the operating room on 12/13/2020 by Dr. Jena Gauss for the above procedure.  She tolerated this well without complications.  She was admitted to the orthopedic trauma service overnight for observation and pain control.  Was instructed to be nonweightbearing on left upper extremity.  No DVT prophylaxis was required postoperatively for patient's upper extremity injury.  She began working physical and occupational therapy starting on postoperative day #1. On 12/13/2020, the patient was tolerating diet, working well with therapies, pain well controlled, vital signs stable, dressings clean, dry, intact and felt stable for discharge to home. Patient will follow up as below and knows to call with questions or concerns.     Consults: None  Significant Diagnostic Studies:   Results for orders placed or performed during the hospital encounter of 12/12/20 (from the past 168 hour(s))  SARS CORONAVIRUS 2 (TAT 6-24 HRS) Nasopharyngeal Nasopharyngeal Swab    Collection Time: 12/12/20  8:39 AM   Specimen: Nasopharyngeal Swab  Result Value Ref Range   SARS Coronavirus 2 NEGATIVE NEGATIVE  CBC   Collection Time: 12/12/20  9:53 AM  Result Value Ref Range   WBC 7.0 4.0 - 10.5 K/uL   RBC 3.96 3.87 - 5.11 MIL/uL   Hemoglobin 12.7 12.0 - 15.0 g/dL   HCT 02.6 37.8 - 58.8 %   MCV 96.7 80.0 - 100.0 fL   MCH 32.1 26.0 - 34.0 pg   MCHC 33.2 30.0 - 36.0 g/dL   RDW 50.2 77.4 - 12.8 %   Platelets 228 150 - 400 K/uL   nRBC 0.0 0.0 - 0.2 %  Basic metabolic panel   Collection Time: 12/12/20  9:53 AM  Result Value Ref Range   Sodium 140 135 - 145 mmol/L   Potassium 3.4 (L) 3.5 - 5.1 mmol/L   Chloride 105 98 - 111 mmol/L   CO2 27 22 - 32 mmol/L   Glucose, Bld 93 70 - 99 mg/dL   BUN 11 6 - 20 mg/dL   Creatinine, Ser 7.86 0.44 - 1.00 mg/dL   Calcium 8.6 (L) 8.9 - 10.3 mg/dL   GFR, Estimated >76 >72 mL/min   Anion gap 8 5 - 15  Basic metabolic panel   Collection Time: 12/13/20  2:38 AM  Result Value Ref Range   Sodium 134 (L) 135 - 145 mmol/L   Potassium 3.8 3.5 - 5.1 mmol/L   Chloride 98 98 - 111 mmol/L   CO2 27 22 - 32 mmol/L   Glucose, Bld 127 (H) 70 - 99 mg/dL   BUN 14 6 - 20 mg/dL   Creatinine,  Ser 0.60 0.44 - 1.00 mg/dL   Calcium 8.0 (L) 8.9 - 10.3 mg/dL   GFR, Estimated >35 >32 mL/min   Anion gap 9 5 - 15  CBC   Collection Time: 12/13/20  2:38 AM  Result Value Ref Range   WBC 9.7 4.0 - 10.5 K/uL   RBC 3.28 (L) 3.87 - 5.11 MIL/uL   Hemoglobin 10.7 (L) 12.0 - 15.0 g/dL   HCT 99.2 (L) 42.6 - 83.4 %   MCV 99.1 80.0 - 100.0 fL   MCH 32.6 26.0 - 34.0 pg   MCHC 32.9 30.0 - 36.0 g/dL   RDW 19.6 22.2 - 97.9 %   Platelets 218 150 - 400 K/uL   nRBC 0.0 0.0 - 0.2 %     Treatments: IV hydration, antibiotics: Ancef, analgesia: acetaminophen, Dilaudid, and oxycodone, therapies: PT and OT, and surgery: As above  Discharge Exam: General: Sitting up in bed, no acute distress Respiratory: No increased work of breathing at rest Left upper  extremity: Dressing clean, dry, intact.  Able to wiggle fingers.  Endorses sensation of light touch distally.  Tolerates gentle elbow motion.  Full painless wrist motion.+ Radial pulse  Disposition: Discharge disposition: 01-Home or Self Care        Allergies as of 12/13/2020       Reactions   Codeine Nausea And Vomiting        Medication List     STOP taking these medications    aspirin EC 81 MG tablet   docusate sodium 100 MG capsule Commonly known as: COLACE       TAKE these medications    cholecalciferol 25 MCG (1000 UNIT) tablet Commonly known as: VITAMIN D3 Take 1,000 Units by mouth daily.   escitalopram 10 MG tablet Commonly known as: LEXAPRO Take 10 mg by mouth daily.   Fish Oil 1000 MG Caps Take 1,000 mg by mouth daily.   Ginkgo Biloba Extract 60 MG Caps Take 60 mg by mouth daily.   GLUCOSAMINE 1500 COMPLEX PO Take 2 tablets by mouth daily.   methocarbamol 500 MG tablet Commonly known as: ROBAXIN Take 1 tablet (500 mg total) by mouth every 6 (six) hours as needed for muscle spasms.   Milk Thistle 140 MG Caps Take 140 mg by mouth daily.   omeprazole 40 MG capsule Commonly known as: PRILOSEC Take 40 mg by mouth daily.   ondansetron 4 MG tablet Commonly known as: ZOFRAN Take 1 tablet (4 mg total) by mouth every 6 (six) hours as needed for nausea.   oxyCODONE 5 MG immediate release tablet Commonly known as: Oxy IR/ROXICODONE Take 1-2 tablets (5-10 mg total) by mouth every 6 (six) hours as needed for severe pain or moderate pain. What changed: reasons to take this   vitamin E 1000 UNIT capsule Take 1,000 Units by mouth daily.       ASK your doctor about these medications    amphetamine-dextroamphetamine 30 MG tablet Commonly known as: Adderall Take 1 tablet by mouth 2 (two) times daily.   hydrochlorothiazide 25 MG tablet Commonly known as: HYDRODIURIL TAKE 1 TABLET(25 MG) BY MOUTH DAILY        Follow-up Information      Haddix, Gillie Manners, MD. Schedule an appointment as soon as possible for a visit in 2 week(s).   Specialty: Orthopedic Surgery Why: For repeat x-rays and wound check Contact information: [address] [place] [address] [phone]  Discharge Instructions and Plan: Patient will be discharged to home.  No DVT prophylaxis required at discharge. Patient has all the necessary DME for discharge. Patient will follow up with Dr. Jena Gauss in 2 weeks for repeat x-rays and wound check.   Signed:  Shawn Route. Ladonna Snide ?[phone]? (phone) 12/13/2020, 10:22 AM  [facility] [address] [place] [address] [phone] (F)
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
## 2020-12-13 NOTE — TOC Progression Note (Signed)
Transition of Care [facility]) - Progression Note    Patient Details  Name: Rebekah Campbell MRN: [id number] Date of Birth: 1966-02-10  [facility] CM/SW Contact  Ralene Bathe, LCSWA Phone Number: 12/13/2020, 11:22 AM  Clinical Narrative:     CSW spoke with patient to assess any potential d/c needs.  CSW verified the patient's phone number, address, and PCP.  The patient informed CSW that she had equipment from a previous surgery at the home and that her significant other will be assisting as needed.  The patient did not have any concerns for medication affordability, but did request stronger pain medication.  CSW encouraged the patient to speak with the RN or MD in reference to pain control.    CSW will sign off for now as social work intervention is no longer needed. Please consult [place] again if new needs arise.         Expected Discharge Plan and Services           Expected Discharge Date: 12/13/20                                     Social Determinants of Health (SDOH) Interventions    Readmission Risk Interventions No flowsheet data found.

## 2020-12-17 ENCOUNTER — Encounter (HOSPITAL_COMMUNITY): Payer: Self-pay | Admitting: Student

## 2021-04-23 ENCOUNTER — Other Ambulatory Visit: Payer: Self-pay

## 2021-04-23 NOTE — Telephone Encounter (Signed)
Pt called requesting to speak to a nurse about medications. She stated that she has medications from a previous doctor that is supposed to transfer to Dr Jimmey Ralph. Please Advise.

## 2021-04-24 ENCOUNTER — Other Ambulatory Visit: Payer: Self-pay | Admitting: *Deleted

## 2021-04-24 NOTE — Telephone Encounter (Signed)
Spoke with patient requesting Rx Trazodone refill  Stated unable to sleep It was prescribed in the past by Dr Reyes Ivan in a rehab center

## 2021-04-25 MED ORDER — TRAZODONE HCL 100 MG PO TABS: 100.0000 mg | ORAL_TABLET | Freq: Every day | ORAL | 3 refills | Status: DC

## 2021-04-26 ENCOUNTER — Telehealth: Payer: Self-pay | Admitting: *Deleted

## 2021-04-26 MED ORDER — AMPHETAMINE-DEXTROAMPHETAMINE 20 MG PO TABS: 30.0000 mg | ORAL_TABLET | Freq: Two times a day (BID) | ORAL | 0 refills | Status: DC

## 2021-04-26 NOTE — Telephone Encounter (Signed)
Patient call stated call [facility] for refills on Adderall  Pharmacy stated they out of Adderall  30 mg they have 20mg  in stock  If is possible to send a Rx Adderall 20mg   take 1 1/2 tab bid  Please advise

## 2021-05-10 ENCOUNTER — Other Ambulatory Visit: Payer: Self-pay | Admitting: Family Medicine

## 2021-05-11 ENCOUNTER — Other Ambulatory Visit: Payer: Self-pay | Admitting: Family Medicine

## 2021-05-17 ENCOUNTER — Other Ambulatory Visit: Payer: Self-pay | Admitting: Family Medicine

## 2021-06-25 ENCOUNTER — Telehealth: Payer: Self-pay | Admitting: Family Medicine

## 2021-06-25 NOTE — Telephone Encounter (Signed)
pt stated quantity may need an increase bc of the dosage change.   Medication:  amphetamine-dextroamphetamine (ADDERALL) 20 MG tablet [[id number]]     Has the patient contacted their pharmacy? No. (If no, request that the patient contact the pharmacy for the refill.) (If yes, when and what did the pharmacy advise?)     Preferred Pharmacy (with phone number or street name):  [id number] - [place],  - [address] [place] [address] AT [facility] [address]  [address] [place], [place] [address]  Phone:  [phone]  Fax:  [phone]     Agent: Please be advised that RX refills may take up to 3 business days. We ask that you follow-up with your pharmacy.

## 2021-06-26 NOTE — Telephone Encounter (Signed)
Please see note and advise  

## 2021-06-26 NOTE — Telephone Encounter (Signed)
LVM for patient to call back and schedule appt about dosage change per Dr. Jimmey Ralph.

## 2021-06-26 NOTE — Telephone Encounter (Signed)
Quantify had originally be changed due to a shortage in Adderrall.  A lot of pharmacies are only stocking 20mg .    I have scheduled patient for CPE on January 6th.   Patient is out of medication.  Is requesting enough medication to get her through until 1/6.    Please follow up with patient in regard.

## 2021-06-26 NOTE — Telephone Encounter (Signed)
She needs an office visit.  Rebekah Campbell. Jimmey Ralph, MD 06/26/2021 9:29 AM

## 2021-06-26 NOTE — Telephone Encounter (Signed)
Please call pt and schedule an office visit with Dr. Jimmey Ralph per him to discuss Adderall.

## 2021-06-27 MED ORDER — AMPHETAMINE-DEXTROAMPHETAMINE 20 MG PO TABS: 30.0000 mg | ORAL_TABLET | Freq: Two times a day (BID) | ORAL | 0 refills | Status: DC

## 2021-06-27 NOTE — Telephone Encounter (Signed)
Patient calling again checking on refill of medications  Wanted to make sure we understood that she did not want a higher dosage She was letting us know that there was a shortage and it would need to be sent in different

## 2021-07-05 ENCOUNTER — Ambulatory Visit (INDEPENDENT_AMBULATORY_CARE_PROVIDER_SITE_OTHER): Payer: Managed Care, Other (non HMO) | Admitting: Family Medicine

## 2021-07-05 ENCOUNTER — Encounter: Payer: Self-pay | Admitting: Family Medicine

## 2021-07-05 ENCOUNTER — Other Ambulatory Visit: Payer: Self-pay

## 2021-07-05 VITALS — BP 132/84 | HR 83 | Temp 98.0°F | Ht 65.0 in | Wt 129.8 lb

## 2021-07-05 DIAGNOSIS — R739 Hyperglycemia, unspecified: Secondary | ICD-10-CM

## 2021-07-05 DIAGNOSIS — F3342 Major depressive disorder, recurrent, in full remission: Secondary | ICD-10-CM

## 2021-07-05 DIAGNOSIS — Z0001 Encounter for general adult medical examination with abnormal findings: Secondary | ICD-10-CM | POA: Diagnosis not present

## 2021-07-05 DIAGNOSIS — G4700 Insomnia, unspecified: Secondary | ICD-10-CM

## 2021-07-05 DIAGNOSIS — I10 Essential (primary) hypertension: Secondary | ICD-10-CM | POA: Diagnosis not present

## 2021-07-05 DIAGNOSIS — F419 Anxiety disorder, unspecified: Secondary | ICD-10-CM

## 2021-07-05 DIAGNOSIS — F900 Attention-deficit hyperactivity disorder, predominantly inattentive type: Secondary | ICD-10-CM

## 2021-07-05 DIAGNOSIS — Z13220 Encounter for screening for lipoid disorders: Secondary | ICD-10-CM

## 2021-07-05 DIAGNOSIS — R7989 Other specified abnormal findings of blood chemistry: Secondary | ICD-10-CM

## 2021-07-05 NOTE — Assessment & Plan Note (Signed)
Stable on Lexapro 10 mg daily.

## 2021-07-05 NOTE — Progress Notes (Signed)
Chief Complaint:  Rebekah Campbell is a 56 y.o. female who presents today for her annual comprehensive physical exam.    Assessment/Plan:  Chronic Problems Addressed Today: Anxiety Stable on Lexapro 10 mg daily.  Low vitamin D level Check vitamin D  Essential hypertension At goal.  Uses HCTZ 25 mg daily as needed.  We will check labs today.  Recurrent major depressive disorder, in full remission (HCC) Stable on Lexapro 10 mg daily.  Attention deficit hyperactivity disorder (ADHD), predominantly inattentive type Doing well with Adderall 30 mg twice daily.  Database without red flags.  Medications help with ability to stay focused and on task.  Follow-up in 6 months for refills if needed.  Insomnia Stable on trazodone 100 mg nightly.  Preventative Healthcare: Check labs.  Mammogram later this year.  Up-to-date on Pap and colon cancer screening.  Patient Counseling(The following topics were reviewed and/or handout was given):  -Nutrition: Stressed importance of moderation in sodium/caffeine intake, saturated fat and cholesterol, caloric balance, sufficient intake of fresh fruits, vegetables, and fiber.  -Stressed the importance of regular exercise.   -Substance Abuse: Discussed cessation/primary prevention of tobacco, alcohol, or other drug use; driving or other dangerous activities under the influence; availability of treatment for abuse.   -Injury prevention: Discussed safety belts, safety helmets, smoke detector, smoking near bedding or upholstery.   -Sexuality: Discussed sexually transmitted diseases, partner selection, use of condoms, avoidance of unintended pregnancy and contraceptive alternatives.   -Dental health: Discussed importance of regular tooth brushing, flossing, and dental visits.  -Health maintenance and immunizations reviewed. Please refer to Health maintenance section.  Return to care in 1 year for next preventative visit.     Subjective:  HPI:  She has  no acute complaints today.   Lifestyle Diet: Balanced.  Exercise: Likes playing basketball. Likes running.   Depression screen PHQ 2/9 07/05/2021  Decreased Interest 0  Down, Depressed, Hopeless 0  PHQ - 2 Score 0    Health Maintenance Due  Topic Date Due   Pneumococcal Vaccine 59-25 Years old (1 - PCV) Never done   Hepatitis C Screening  Never done   TETANUS/TDAP  Never done   Zoster Vaccines- Shingrix (1 of 2) Never done   COVID-19 Vaccine (3 - Booster for Pfizer series) 04/25/2021     ROS: Per HPI, otherwise a complete review of systems was negative.   PMH:  The following were reviewed and entered/updated in epic: Past Medical History:  Diagnosis Date   ADHD (attention deficit hyperactivity disorder)    Depression    HTN (hypertension)    Inguinal hernia of right side with obstruction and without gangrene    Patient Active Problem List   Diagnosis Date Noted   Anxiety 08/14/2020   Essential hypertension 11/27/2017   Low vitamin D level 11/27/2017   Arthritis of carpometacarpal (CMC) joint of left thumb 10/27/2017   TFCC (triangular fibrocartilage complex) injury, left, initial encounter 10/02/2017   Attention deficit hyperactivity disorder (ADHD), predominantly inattentive type 01/03/2016   Recurrent major depressive disorder, in full remission ([place]) 01/03/2016   Insomnia 09/21/2015   Past Surgical History:  Procedure Laterality Date    IRRIGATION AND DEBRIDEMENT EXTR IRRIGATION AND DEBRIDEMENT EXTREMITY (Left )EMITY (Left )  10/01/2020   HERNIA REPAIR     I & D EXTREMITY Left 10/01/2020   Procedure: IRRIGATION AND DEBRIDEMENT EXTREMITY;  Surgeon: Shona Needles, MD;  Location: [facility];  Service: Orthopedics;  Laterality: Left;   OPEN REDUCTION INTERNAL FIXATION (  ORIF) DISTAL HUMERUS FRACTURE (Left )  10/01/2020   ORIF HUMERUS FRACTURE Left 10/01/2020   Procedure: OPEN REDUCTION INTERNAL FIXATION (ORIF) DISTAL HUMERUS FRACTURE;  Surgeon: Shona Needles, MD;  Location:  [facility];  Service: Orthopedics;  Laterality: Left;   ORIF HUMERUS FRACTURE Left 12/12/2020   Procedure: OPEN REDUCTION INTERNAL FIXATION (ORIF) HUMERAL SHAFT FRACTURE;  Surgeon: Shona Needles, MD;  Location: [facility];  Service: Orthopedics;  Laterality: Left;   TONSILLECTOMY      Family History  Problem Relation Age of Onset   Anesthesia problems Neg Hx     Medications- reviewed and updated Current Outpatient Medications  Medication Sig Dispense Refill   amphetamine-dextroamphetamine (ADDERALL) 20 MG tablet Take 1.5 tablets (30 mg total) by mouth 2 (two) times daily. 90 tablet 0   cholecalciferol (VITAMIN D3) 25 MCG (1000 UNIT) tablet Take 1,000 Units by mouth daily.     escitalopram (LEXAPRO) 10 MG tablet TAKE 1 TABLET(10 MG) BY MOUTH DAILY 90 tablet 0   Ginkgo Biloba Extract 60 MG CAPS Take 60 mg by mouth daily.     Glucosamine-Chondroit-Vit C-Mn (GLUCOSAMINE 1500 COMPLEX PO) Take 2 tablets by mouth daily.     hydrochlorothiazide (HYDRODIURIL) 25 MG tablet TAKE 1 TABLET(25 MG) BY MOUTH DAILY (Patient taking differently: Take 25 mg by mouth daily as needed (swelling).) 90 tablet 3   methocarbamol (ROBAXIN) 500 MG tablet Take 1 tablet (500 mg total) by mouth every 6 (six) hours as needed for muscle spasms. 20 tablet 0   Milk Thistle 140 MG CAPS Take 140 mg by mouth daily.     Omega-3 Fatty Acids (FISH OIL) 1000 MG CAPS Take 1,000 mg by mouth daily.     omeprazole (PRILOSEC) 40 MG capsule TAKE 1 CAPSULE BY MOUTH EVERY DAY 90 capsule 0   traZODone (DESYREL) 100 MG tablet Take 1 tablet (100 mg total) by mouth at bedtime. 90 tablet 3   vitamin E 1000 UNIT capsule Take 1,000 Units by mouth daily.     No current facility-administered medications for this visit.   Facility-Administered Medications Ordered in Other Visits  Medication Dose Route Frequency Provider Last Rate Last Admin   fentaNYL (SUBLIMAZE) injection    PRN Laretta Alstrom, CRNA   50 mcg at 10/18/11 0926   lidocaine (cardiac) 100  mg/26ml (XYLOCAINE) 20 MG/ML injection 2%    PRN Laretta Alstrom, CRNA   80 mg at 10/18/11 0920   propofol (DIPRIVAN) 10 mg/mL bolus    PRN Laretta Alstrom, CRNA   200 mg at 10/18/11 0920    Allergies-reviewed and updated Allergies  Allergen Reactions   Codeine Nausea And Vomiting    Social History   Socioeconomic History   Marital status: Single    Spouse name: Not on file   Number of children: Not on file   Years of education: Not on file   Highest education level: Not on file  Occupational History   Not on file  Tobacco Use   Smoking status: Never   Smokeless tobacco: Never  Substance and Sexual Activity   Alcohol use: Yes    Alcohol/week: 3.0 - 5.0 standard drinks    Types: 3 - 5 Glasses of wine per week   Drug use: No   Sexual activity: Yes  Other Topics Concern   Not on file  Social History Narrative   Not on file   Social Determinants of Health   Financial Resource Strain: Not on file  Food Insecurity:  Not on file  Transportation Needs: Not on file  Physical Activity: Not on file  Stress: Not on file  Social Connections: Not on file        Objective:  Physical Exam: BP 132/84 (BP Location: Left Arm, Patient Position: Sitting, Cuff Size: Normal)    Pulse 83    Temp 98 F (36.7 C) (Temporal)    Ht 5\' 5"  (1.651 m)    Wt 129 lb 12.8 oz (58.9 kg)    SpO2 97%    BMI 21.60 kg/m   Body mass index is 21.6 kg/m. Wt Readings from Last 3 Encounters:  07/05/21 129 lb 12.8 oz (58.9 kg)  12/12/20 130 lb (59 kg)  09/29/20 136 lb (61.7 kg)   Gen: NAD, resting comfortably HEENT: TMs normal bilaterally. OP clear. No thyromegaly noted.  CV: RRR with no murmurs appreciated Pulm: NWOB, CTAB with no crackles, wheezes, or rhonchi GI: Normal bowel sounds present. Soft, Nontender, Nondistended. MSK: no edema, cyanosis, or clubbing noted Skin: warm, dry Neuro: CN2-12 grossly intact. Strength 5/5 in upper and lower extremities. Reflexes symmetric and intact bilaterally.   Psych: Normal affect and thought content     Kayani Rapaport M. Jerline Pain, MD 07/05/2021 2:52 PM

## 2021-07-05 NOTE — Patient Instructions (Signed)
It was very nice to see you today!  We will check blood work today.  I will see back in year for your next physical.  Please come back in 6 months for medication check.  Come back to see [place] sooner if needed.  Take care, Dr Jerline Pain  PLEASE NOTE:  If you had any lab tests please let us know if you have not heard back within a few days. You may see your results on mychart before we have a chance to review them but we will give you a call once they are reviewed by [place]. If we ordered any referrals today, please let us know if you have not heard from their office within the next week.   Please try these tips to maintain a healthy lifestyle:  Eat at least 3 REAL meals and 1-2 snacks per day.  Aim for no more than 5 hours between eating.  If you eat breakfast, please do so within one hour of getting up.   Each meal should contain half fruits/vegetables, one quarter protein, and one quarter carbs (no bigger than a computer mouse)  Cut down on sweet beverages. This includes juice, soda, and sweet tea.   Drink at least 1 glass of water with each meal and aim for at least 8 glasses per day  Exercise at least 150 minutes every week.    Preventive Care 46-74 Years Old, Female Preventive care refers to lifestyle choices and visits with your health care provider that can promote health and wellness. Preventive care visits are also called wellness exams. What can I expect for my preventive care visit? Counseling Your health care provider may ask you questions about your: Medical history, including: Past medical problems. Family medical history. Pregnancy history. Current health, including: Menstrual cycle. Method of birth control. Emotional well-being. Home life and relationship well-being. Sexual activity and sexual health. Lifestyle, including: Alcohol, nicotine or tobacco, and drug use. Access to firearms. Diet, exercise, and sleep habits. Work and work Statistician. Sunscreen  use. Safety issues such as seatbelt and bike helmet use. Physical exam Your health care provider will check your: Height and weight. These may be used to calculate your BMI (body mass index). BMI is a measurement that tells if you are at a healthy weight. Waist circumference. This measures the distance around your waistline. This measurement also tells if you are at a healthy weight and may help predict your risk of certain diseases, such as type 2 diabetes and high blood pressure. Heart rate and blood pressure. Body temperature. Skin for abnormal spots. What immunizations do I need? Vaccines are usually given at various ages, according to a schedule. Your health care provider will recommend vaccines for you based on your age, medical history, and lifestyle or other factors, such as travel or where you work. What tests do I need? Screening Your health care provider may recommend screening tests for certain conditions. This may include: Lipid and cholesterol levels. Diabetes screening. This is done by checking your blood sugar (glucose) after you have not eaten for a while (fasting). Pelvic exam and Pap test. Hepatitis B test. Hepatitis C test. HIV (human immunodeficiency virus) test. STI (sexually transmitted infection) testing, if you are at risk. Lung cancer screening. Colorectal cancer screening. Mammogram. Talk with your health care provider about when you should start having regular mammograms. This may depend on whether you have a family history of breast cancer. BRCA-related cancer screening. This may be done if you have a family history  of breast, ovarian, tubal, or peritoneal cancers. Bone density scan. This is done to screen for osteoporosis. Talk with your health care provider about your test results, treatment options, and if necessary, the need for more tests. Follow these instructions at home: Eating and drinking  Eat a diet that includes fresh fruits and vegetables, whole  grains, lean protein, and low-fat dairy products. Take vitamin and mineral supplements as recommended by your health care provider. Do not drink alcohol if: Your health care provider tells you not to drink. You are pregnant, may be pregnant, or are planning to become pregnant. If you drink alcohol: Limit how much you have to 0-1 drink a day. Know how much alcohol is in your drink. In the [place] one drink equals one 12 oz bottle of beer (355 mL), one 5 oz glass of wine (148 mL), or one 1 oz glass of hard liquor (44 mL). Lifestyle Brush your teeth every morning and night with fluoride toothpaste. Floss one time each day. Exercise for at least 30 minutes 5 or more days each week. Do not use any products that contain nicotine or tobacco. These products include cigarettes, chewing tobacco, and vaping devices, such as e-cigarettes. If you need help quitting, ask your health care provider. Do not use drugs. If you are sexually active, practice safe sex. Use a condom or other form of protection to prevent STIs. If you do not wish to become pregnant, use a form of birth control. If you plan to become pregnant, see your health care provider for a prepregnancy visit. Take aspirin only as told by your health care provider. Make sure that you understand how much to take and what form to take. Work with your health care provider to find out whether it is safe and beneficial for you to take aspirin daily. Find healthy ways to manage stress, such as: Meditation, yoga, or listening to music. Journaling. Talking to a trusted person. Spending time with friends and family. Minimize exposure to UV radiation to reduce your risk of skin cancer. Safety Always wear your seat belt while driving or riding in a vehicle. Do not drive: If you have been drinking alcohol. Do not ride with someone who has been drinking. When you are tired or distracted. While texting. If you have been using any mind-altering substances  or drugs. Wear a helmet and other protective equipment during sports activities. If you have firearms in your house, make sure you follow all gun safety procedures. Seek help if you have been physically or sexually abused. What's next? Visit your health care provider once a year for an annual wellness visit. Ask your health care provider how often you should have your eyes and teeth checked. Stay up to date on all vaccines. This information is not intended to replace advice given to you by your health care provider. Make sure you discuss any questions you have with your health care provider. Document Revised: 12/12/2020 Document Reviewed: 12/12/2020 Elsevier Patient Education  [place].

## 2021-07-05 NOTE — Assessment & Plan Note (Signed)
Doing well with Adderall 30 mg twice daily.  Database without red flags.  Medications help with ability to stay focused and on task.  Follow-up in 6 months for refills if needed.

## 2021-07-05 NOTE — Assessment & Plan Note (Signed)
Stable on trazodone 100 mg nightly. 

## 2021-07-05 NOTE — Assessment & Plan Note (Signed)
At goal.  Uses HCTZ 25 mg daily as needed.  We will check labs today.

## 2021-07-05 NOTE — Assessment & Plan Note (Signed)
Check vitamin D. 

## 2021-07-06 LAB — COMPREHENSIVE METABOLIC PANEL
AG Ratio: 1.8 (calc) (ref 1.0–2.5)
ALT: 26 U/L (ref 6–29)
AST: 53 U/L — ABNORMAL HIGH (ref 10–35)
Albumin: 4.3 g/dL (ref 3.6–5.1)
Alkaline phosphatase (APISO): 71 U/L (ref 37–153)
BUN: 15 mg/dL (ref 7–25)
CO2: 25 mmol/L (ref 20–32)
Calcium: 9.3 mg/dL (ref 8.6–10.4)
Chloride: 106 mmol/L (ref 98–110)
Creat: 0.65 mg/dL (ref 0.50–1.03)
Globulin: 2.4 g/dL (calc) (ref 1.9–3.7)
Glucose, Bld: 92 mg/dL (ref 65–99)
Potassium: 4.3 mmol/L (ref 3.5–5.3)
Sodium: 139 mmol/L (ref 135–146)
Total Bilirubin: 0.6 mg/dL (ref 0.2–1.2)
Total Protein: 6.7 g/dL (ref 6.1–8.1)

## 2021-07-06 LAB — CBC
HCT: 37.8 % (ref 35.0–45.0)
Hemoglobin: 12.7 g/dL (ref 11.7–15.5)
MCH: 32.5 pg (ref 27.0–33.0)
MCHC: 33.6 g/dL (ref 32.0–36.0)
MCV: 96.7 fL (ref 80.0–100.0)
MPV: 9.4 fL (ref 7.5–12.5)
Platelets: 261 10*3/uL (ref 140–400)
RBC: 3.91 10*6/uL (ref 3.80–5.10)
RDW: 12.2 % (ref 11.0–15.0)
WBC: 5.5 10*3/uL (ref 3.8–10.8)

## 2021-07-06 LAB — HEMOGLOBIN A1C
Hgb A1c MFr Bld: 5.1 % of total Hgb (ref <5.7)
Mean Plasma Glucose: 100 mg/dL
eAG (mmol/L): 5.5 mmol/L

## 2021-07-06 LAB — VITAMIN D 25 HYDROXY (VIT D DEFICIENCY, FRACTURES): Vit D, 25-Hydroxy: 77 ng/mL (ref 30–100)

## 2021-07-06 LAB — LIPID PANEL
Cholesterol: 175 mg/dL (ref <200)
HDL: 97 mg/dL (ref >=50)
LDL Cholesterol (Calc): 68 mg/dL (calc)
Non-HDL Cholesterol (Calc): 78 mg/dL (calc) (ref <130)
Total CHOL/HDL Ratio: 1.8 (calc) (ref <5.0)
Triglycerides: 34 mg/dL (ref <150)

## 2021-07-06 LAB — TSH: TSH: 1.76 mIU/L (ref 0.40–4.50)

## 2021-07-08 NOTE — Progress Notes (Signed)
Please inform patient of the following:  One of her liver numbers is up slightly but everything else is normal.  Would like for her to cut down on alcohol and Tylenol use if she is using any and we can recheck in 3 to 6 months.  Do not need to make any other changes to treatment plan at this time.  We can recheck everything else in year.

## 2021-07-10 ENCOUNTER — Other Ambulatory Visit: Payer: Self-pay | Admitting: *Deleted

## 2021-07-10 DIAGNOSIS — R748 Abnormal levels of other serum enzymes: Secondary | ICD-10-CM

## 2021-07-10 NOTE — Progress Notes (Signed)
Information given to patient will recheck CMP in 3 months Lab appointment schedule

## 2021-07-10 NOTE — Progress Notes (Signed)
Information given to patient, lab appointment schedule lab order

## 2021-07-10 NOTE — Progress Notes (Signed)
I would not expect any of the medications she is on to cause the elevation. We can recheck in 3-6 months.

## 2021-08-09 ENCOUNTER — Other Ambulatory Visit: Payer: Self-pay | Admitting: Family Medicine

## 2021-08-09 ENCOUNTER — Other Ambulatory Visit: Payer: Self-pay

## 2021-08-09 MED ORDER — VITAMIN D 25 MCG (1000 UNIT) PO TABS: 1000.0000 [IU] | ORAL_TABLET | Freq: Every day | ORAL | 1 refills | Status: AC

## 2021-08-09 MED ORDER — AMPHETAMINE-DEXTROAMPHETAMINE 20 MG PO TABS: 30.0000 mg | ORAL_TABLET | Freq: Two times a day (BID) | ORAL | 0 refills | Status: DC

## 2021-08-09 NOTE — Telephone Encounter (Signed)
Her rx was already written for 1.5 pills twice daily. I can send in a refill if needed.

## 2021-08-09 NOTE — Telephone Encounter (Signed)
Spoke w/ pt verifed DOB; clarified that pt is needing refill not a new rx for 30 mg.

## 2021-08-09 NOTE — Telephone Encounter (Signed)
Pt asked if if the amount could be increased. She is wondering if she could take 1 1/2 pills instead of 1. This would give her the 30 mg she was originally supposed to be taking. Please advise

## 2021-08-09 NOTE — Telephone Encounter (Signed)
Please advise 

## 2021-08-09 NOTE — Telephone Encounter (Signed)
Patient requesting Rx request.

## 2021-08-09 NOTE — Telephone Encounter (Signed)
.. °  Encourage patient to contact the pharmacy for refills or they can request refills through [facility]  LAST APPOINTMENT DATE:  07/05/21  NEXT APPOINTMENT DATE: 09/09/21  MEDICATION:amphetamine-dextroamphetamine (ADDERALL) 20 MG tablet  Is the patient out of medication?   PHARMACY: [facility] [phone] - [place], [place] - [address] [place] [address] [place] [address] Phone:  [phone]  Fax:  [phone]      Let patient know to contact pharmacy at the end of the day to make sure medication is ready.  Please notify patient to allow 48-72 hours to process

## 2021-08-12 ENCOUNTER — Telehealth: Payer: Self-pay | Admitting: Family Medicine

## 2021-08-12 MED ORDER — AMPHETAMINE-DEXTROAMPHETAMINE 30 MG PO TABS: 30.0000 mg | ORAL_TABLET | Freq: Two times a day (BID) | ORAL | 0 refills | Status: DC

## 2021-08-12 NOTE — Telephone Encounter (Signed)
Spoke w/ pt verifed DOB and called pharmacy for clarification pt insurance denied medication Adderall in 20 mg but they now have 30 mg. Previous Rx cancelled can you please send new Rx into pharmacy for 30 mg to be filled.

## 2021-08-12 NOTE — Telephone Encounter (Signed)
Last visit: 08/14/20  Next visit: nothing schedued  Last refill: 05/13/21  Quantity:  90 w/ 0 refills

## 2021-08-12 NOTE — Telephone Encounter (Signed)
Patient called after hours. Patient did not clarify prescription name that needs authorization. A vm was left for patient to call back.   patient Name: Rebekah Campbell Gender: Female DOB: 11/17/1955 Age: 56 Y 8 M 22 D Return Phone Number: [phone] (Primary) Address: City/ State/ Zip: [place]  [address] Client [facility] - Human resources officer [facility] [employer] Provider Jacquiline Doe- MD Contact Type Call Who Is Calling Patient / Member / Family / Caregiver Call Type Triage / Clinical Relationship To Patient Self Return Phone Number [phone] (Primary) Chief Complaint Prescription Refill or Medication Request (non symptomatic) Reason for Call Medication Question / Request Initial Comment Caller states she needs a prescription approved at her pharmacy. There is quantity shortage and needs an arthorization Translation No Nurse Assessment Nurse: Doree Barthel, RN, Danica Date/Time (Eastern Time): 08/09/2021 11:39:53 PM Confirm and document reason for call. If symptomatic, describe symptoms. ---Caller states she does not want a return call, just wants the office to know that her medication needs authorization from provider. Does the patient have any new or worsening symptoms? ---No Disp. Time Lamount Cohen Time) Disposition Final User 08/09/2021 11:40:45 PM Clinical Call Yes Bringas, RN, Nicholes Mango

## 2021-08-12 NOTE — Telephone Encounter (Signed)
Patient calling back and wants to speak to the [place] about her medication.

## 2021-08-12 NOTE — Addendum Note (Signed)
Addended by: Vivi Barrack on: 08/12/2021 03:49 PM   Modules accepted: Orders

## 2021-09-09 ENCOUNTER — Other Ambulatory Visit (INDEPENDENT_AMBULATORY_CARE_PROVIDER_SITE_OTHER): Payer: Managed Care, Other (non HMO)

## 2021-09-09 DIAGNOSIS — R748 Abnormal levels of other serum enzymes: Secondary | ICD-10-CM

## 2021-09-09 LAB — COMPREHENSIVE METABOLIC PANEL
ALT: 17 U/L (ref 0–35)
AST: 35 U/L (ref 0–37)
Albumin: 4.5 g/dL (ref 3.5–5.2)
Alkaline Phosphatase: 63 U/L (ref 39–117)
BUN: 10 mg/dL (ref 6–23)
CO2: 26 mEq/L (ref 19–32)
Calcium: 9.5 mg/dL (ref 8.4–10.5)
Chloride: 103 mEq/L (ref 96–112)
Creatinine, Ser: 0.66 mg/dL (ref 0.40–1.20)
GFR: 97.79 mL/min (ref >60.00)
Glucose, Bld: 101 mg/dL — ABNORMAL HIGH (ref 70–99)
Potassium: 4.2 mEq/L (ref 3.5–5.1)
Sodium: 139 mEq/L (ref 135–145)
Total Bilirubin: 0.5 mg/dL (ref 0.2–1.2)
Total Protein: 6.8 g/dL (ref 6.0–8.3)

## 2021-09-10 NOTE — Progress Notes (Signed)
Please inform patient of the following: ? ?Good news! Her liver numbers are back to normal. We can repeat next time she comes in for an office visit. ? ?Rebekah Campbell. Jerline Pain, MD ?09/10/2021 10:32 AM  ?

## 2021-10-07 ENCOUNTER — Other Ambulatory Visit: Payer: Self-pay

## 2021-10-07 ENCOUNTER — Telehealth: Payer: Self-pay

## 2021-10-07 MED ORDER — AMPHETAMINE-DEXTROAMPHETAMINE 30 MG PO TABS: 30.0000 mg | ORAL_TABLET | Freq: Two times a day (BID) | ORAL | 0 refills | Status: DC

## 2021-10-07 MED ORDER — ESCITALOPRAM OXALATE 10 MG PO TABS: ORAL_TABLET | ORAL | 0 refills | Status: DC

## 2021-10-07 NOTE — Telephone Encounter (Signed)
Please see note and advise  

## 2021-10-07 NOTE — Telephone Encounter (Signed)
Prescription Refill or Medication Request (non ?symptomatic) ?Reason for Call Medication Question / Request ?Initial Comment Caller states she needs refill of her Adderall. She ?is completely out of the medication. Her last dose ?was 3 days ago. ?Translation No ?No Triage Reason Patient declined ?Nurse Assessment ?Nurse: Terrence Dupont, RN, Holly Date/Time (Eastern Time): 10/05/2021 5:30:55 PM ?Confirm and document reason for call. If ?symptomatic, describe symptoms. ?---Caller states she needs refill of her Adderall 30mg  ?twice a day. She is completely out of the medication. ?Her last dose was 3 days ago. caller states she took ?it on a trip and the medication was not in her return ?luggage. caller states she is also out of her Lexapro , ?trazadone for sleep. states she got back last night from ?[place] and her whole medication bag is missing. caller ?uses [facility] in [place], ?Does the patient have any new or worsening ?symptoms? ---Yes ?Will a triage be completed? ---No ?Select reason for no triage. ---Patient declined ?Disp. Time (Eastern ?Time) Disposition Final User ?10/05/2021 4:09:09 PM Send To Nurse 12/05/2021, RN, Lisa ?10/05/2021 4:09:46 PM Send To Nurse 12/05/2021, RN, Lisa ?10/05/2021 4:38:47 PM Attempt made - no message left McClarnon, RN, 12/05/2021 ?10/05/2021 4:57:44 PM FINAL ATTEMPT MADE - message ?left ?McClarnon, RN, [place] ?10/05/2021 4:57:51 PM Send to RN Final Attempt 12/05/2021, RN, Dorna Mai ?PLEASE NOTE: All timestamps contained within this report are represented as Jeanice Lim Standard Time. ?CONFIDENTIALTY NOTICE: This fax transmission is intended only for the addressee. It contains information that is legally privileged, confidential or ?otherwise protected from use or disclosure. If you are not the intended recipient, you are strictly prohibited from reviewing, disclosing, copying using ?or disseminating any of this information or taking any action in reliance on or regarding this information. If you have  received this fax in error, please ?notify [place] immediately by telephone so that we can arrange for its return to [place]. Phone: [phone], Toll-Free: [phone], Fax: [phone] ?Page: 2 of 2 ?Call Id: [phone] ?Disp. Time (Eastern ?Time) Disposition Final User ?10/05/2021 5:45:16 PM Called On-Call Provider 12/05/2021, RN, Terrence Dupont ?10/05/2021 6:29:13 PM Pharmacy Call 12/05/2021, RN, Terrence Dupont ?Reason: Spoke to Jeanice Lim in ?[place] since it is a 24 hour [facility]. Pharmacist states there are ?valid refill on both the Lexapro and the ?Trazadone. Insurance will not cover ?the early refill but if she comes in they ?are get those medications fixed for ?her. ?10/05/2021 5:52:25 PM Clinical Call Yes McClarnon, RN, 12/05/2021 ?Comments ?User: Jeanice Lim Date/Time Therisa Doyne Time): 10/05/2021 5:30:41 PM ?Caller states she missed a call from the nurse. The call was transferred to [facility]. ?User: [facility], RN Date/Time Donzetta Matters Time): 10/05/2021 6:32:27 PM ?left a message that her Lexapro and her trazadone can be refilled she has valid refills. the [facility] will be happy to get that taken care of for her tonight. her other medication being controlled has to wait ?until Monday for a refill. ?Paging ?DoctorName Phone DateTime Result/ ?Outcome Message Type Notes ?Saturday- MD Gweneth Dimitri ?10/05/2021 ?5:45:16 ?PM ?Called On ?Call Provider - ?Reached ?Doctor Paged ?12/05/2021- MD ?10/05/2021 ?5:47:04 ?PM ?Spoke with On ?Call - General Message Result ?reported caller just got [place] ?from [place] and her medications ?are not in her luggage. caller ?staes she has called the airlines ?and the hotel she was staying ?in there is no medication found. ?Medications is Adderall, Lexapro ?and Trazadone. MD staes I ?have call in 3 days worth of her ?Trazadone and her Lexapro but ?she will have to call her PCP  on ?Monday for an adderall refill ?

## 2021-10-07 NOTE — Telephone Encounter (Signed)
Patient states she went to [place].  States she had an emergency and had to fly back to [place].    States while she was in [place] her adderall and lexapro got lost inside the trunk of a friend's card.  States she did not file a police report.  States police does know about this.  States her adderall should be ready for a refill.  Is requesting a refill on the adderall (30 mg) and requesting a refill and call to go to pharmacy to approve lexapro.    Patient is currently using [facility] in [place].  Patient requesting a call back in regard as soon as possible.  States she has been off of meds for 3 days now.

## 2021-10-07 NOTE — Telephone Encounter (Signed)
Pt is asking to have refills called in for 3 mos or more so that she would not have to call in every month to refill. She is wanting to just have to call the pharmacy when the refill is due. Please advise ?

## 2021-10-07 NOTE — Telephone Encounter (Signed)
Dr Jerline Pain sent in refill for Adderall and Lexapro was also sent in for patient to pharmacy. ?

## 2021-10-07 NOTE — Telephone Encounter (Signed)
Rx sent in for adderall. Ok to send in the others if needed. ? ?Katina Degree. Jimmey Ralph, MD ?10/07/2021 12:52 PM  ? ?

## 2021-10-08 MED ORDER — AMPHETAMINE-DEXTROAMPHETAMINE 30 MG PO TABS: 30.0000 mg | ORAL_TABLET | Freq: Two times a day (BID) | ORAL | 0 refills | Status: DC

## 2021-10-08 NOTE — Telephone Encounter (Signed)
Please advise 

## 2021-11-01 ENCOUNTER — Encounter: Payer: Self-pay | Admitting: Family Medicine

## 2021-11-01 ENCOUNTER — Ambulatory Visit (INDEPENDENT_AMBULATORY_CARE_PROVIDER_SITE_OTHER): Payer: Commercial Managed Care - HMO | Admitting: Family Medicine

## 2021-11-01 VITALS — BP 136/88 | HR 86 | Temp 98.1°F | Ht 65.0 in | Wt 123.8 lb

## 2021-11-01 DIAGNOSIS — Z23 Encounter for immunization: Secondary | ICD-10-CM

## 2021-11-01 DIAGNOSIS — F900 Attention-deficit hyperactivity disorder, predominantly inattentive type: Secondary | ICD-10-CM | POA: Diagnosis not present

## 2021-11-01 DIAGNOSIS — F419 Anxiety disorder, unspecified: Secondary | ICD-10-CM

## 2021-11-01 DIAGNOSIS — F3342 Major depressive disorder, recurrent, in full remission: Secondary | ICD-10-CM | POA: Diagnosis not present

## 2021-11-01 MED ORDER — AMPHETAMINE-DEXTROAMPHETAMINE 30 MG PO TABS: 30.0000 mg | ORAL_TABLET | Freq: Two times a day (BID) | ORAL | 0 refills | Status: DC

## 2021-11-01 MED ORDER — ESCITALOPRAM OXALATE 10 MG PO TABS: 20.0000 mg | ORAL_TABLET | Freq: Every day | ORAL | 0 refills | Status: DC

## 2021-11-01 NOTE — Assessment & Plan Note (Signed)
Worsened recently.  We discussed treatment options.  We will be increasing her dose of Lexapro to 20 mg daily.  She will follow-up with me in a few weeks via MyChart. ?

## 2021-11-01 NOTE — Progress Notes (Signed)
? ?  Rebekah Campbell is a 56 y.o. female who presents today for an office visit. ? ?Assessment/Plan:  ?New/Acute Problems: ?Tick Bite ?No evidence of retained mouthparts or other foreign bodies.  Reassured patient.  She will let me know if she develops any new symptoms. ? ?Chronic Problems Addressed Today: ?Anxiety ?Worsened recently.  We discussed treatment options.  We will be increasing her dose of Lexapro to 20 mg daily.  She will follow-up with me in a few weeks via MyChart. ? ?Recurrent major depressive disorder, in full remission ([place]) ?This is also worsened recently.  We will be increasing her dose of Lexapro to 20 mg daily as above.  She will follow-up in a few weeks via MyChart.  If needs further assistance would consider addition of Wellbutrin.  ? ?Attention deficit hyperactivity disorder (ADHD), predominantly inattentive type ?Database without red flags.  Medications help with ability stay focused on task.  No significant side effects.  Will refill today.  Follow-up in 3 to 6 months. ? ?Tdap given today.  ? ?  ?Subjective:  ?HPI: ? ?Patient here with concern for tick bite in her vagina. She found tactics yesterday.  She was able to remove this with assistance of her husband.  She is concerned because she has noticed a red/brown lesion still in the area.  No pain.  No rash.  She also removed a tick from her shoulder this morning.  She has had about 10 tick bites last couple of days due to working in the garden. ? ?   ?  ?Objective:  ?Physical Exam: ?BP 136/88 (BP Location: Left Arm)   Pulse 86   Temp 98.1 ?F (36.7 ?C) (Temporal)   Ht 5\' 5"  (1.651 m)   Wt 123 lb 12.8 oz (56.2 kg)   SpO2 98%   BMI 20.60 kg/m?   ?Gen: No acute distress, resting comfortably ?CV: Regular rate and rhythm with no murmurs appreciated ?Pulm: Normal work of breathing, clear to auscultation bilaterally with no crackles, wheezes, or rhonchi ?GU: Chaperone present for exam - Kela Craddock. Normal external genitalia. Small  excoriations noted around periurethral area near where she had removed the tick.  No retained mouthparts or other foreign bodies noted ?Neuro: Grossly normal, moves all extremities ?Psych: Normal affect and thought content ? ?   ? ?Algis Greenhouse. Jerline Pain, MD ?11/01/2021 9:06 AM  ?

## 2021-11-01 NOTE — Assessment & Plan Note (Signed)
This is also worsened recently.  We will be increasing her dose of Lexapro to 20 mg daily as above.  She will follow-up in a few weeks via MyChart.  If needs further assistance would consider addition of Wellbutrin.  ?

## 2021-11-01 NOTE — Assessment & Plan Note (Signed)
Database without red flags.  Medications help with ability stay focused on task.  No significant side effects.  Will refill today.  Follow-up in 3 to 6 months. ?

## 2021-11-01 NOTE — Patient Instructions (Signed)
It was very nice to see you today! ? ?I did not see any left over tick parts today. ? ?Please increase your Lexapro to 20 mg daily.  I will refill your Adderall. ? ?See me message in a few weeks limit how the increased dose of Lexapro is working. ? ?We will give your tetanus vaccine today. ? ?Please come back to see me in 6 months for medication recheck. ? ?Take care, ?Dr Jerline Pain ? ?PLEASE NOTE: ? ?If you had any lab tests please let us know if you have not heard back within a few days. You may see your results on mychart before we have a chance to review them but we will give you a call once they are reviewed by [place]. If we ordered any referrals today, please let us know if you have not heard from their office within the next week.  ? ?Please try these tips to maintain a healthy lifestyle: ? ?Eat at least 3 REAL meals and 1-2 snacks per day.  Aim for no more than 5 hours between eating.  If you eat breakfast, please do so within one hour of getting up.  ? ?Each meal should contain half fruits/vegetables, one quarter protein, and one quarter carbs (no bigger than a computer mouse) ? ?Cut down on sweet beverages. This includes juice, soda, and sweet tea.  ? ?Drink at least 1 glass of water with each meal and aim for at least 8 glasses per day ? ?Exercise at least 150 minutes every week.   ?

## 2021-12-16 ENCOUNTER — Telehealth: Payer: Self-pay | Admitting: Family Medicine

## 2021-12-16 ENCOUNTER — Other Ambulatory Visit: Payer: Self-pay | Admitting: *Deleted

## 2021-12-16 MED ORDER — HYDROCHLOROTHIAZIDE 25 MG PO TABS: ORAL_TABLET | ORAL | 3 refills | Status: DC

## 2021-12-16 NOTE — Telephone Encounter (Signed)
Na

## 2021-12-17 ENCOUNTER — Encounter: Payer: Self-pay | Admitting: Physician Assistant

## 2021-12-17 ENCOUNTER — Ambulatory Visit (INDEPENDENT_AMBULATORY_CARE_PROVIDER_SITE_OTHER): Payer: Commercial Managed Care - HMO | Admitting: Physician Assistant

## 2021-12-17 VITALS — BP 138/92 | HR 98 | Temp 98.2°F | Ht 65.0 in | Wt 121.2 lb

## 2021-12-17 DIAGNOSIS — W57XXXA Bitten or stung by nonvenomous insect and other nonvenomous arthropods, initial encounter: Secondary | ICD-10-CM

## 2021-12-17 DIAGNOSIS — R634 Abnormal weight loss: Secondary | ICD-10-CM

## 2021-12-17 DIAGNOSIS — I10 Essential (primary) hypertension: Secondary | ICD-10-CM | POA: Diagnosis not present

## 2021-12-17 DIAGNOSIS — S20162A Insect bite (nonvenomous) of breast, left breast, initial encounter: Secondary | ICD-10-CM

## 2021-12-17 NOTE — Patient Instructions (Addendum)
Good to meet you today!  I would suggest warm compresses / epsom salts to area several times daily. May still have part of the stinger in this area, but too small to try to remove in office. Cortisone cream as needed.  Let us know if worse or any changes in symptoms.   Monitor weight & BP. F/up with PCP in 3 months. Call sooner if concerns.

## 2021-12-17 NOTE — Progress Notes (Unsigned)
   Subjective:    Patient ID: Rebekah Campbell, female    DOB: 01/30/1966, 56 y.o.   MRN: [id number]  Chief Complaint  Patient presents with   Insect Bite    Pt had insect bite (yellow jacket)1 mon ago and still painful per pt; left side breast area    HPI Patient is in today for ***  Past Medical History:  Diagnosis Date   ADHD (attention deficit hyperactivity disorder)    Depression    HTN (hypertension)    Inguinal hernia of right side with obstruction and without gangrene     Past Surgical History:  Procedure Laterality Date    IRRIGATION AND DEBRIDEMENT EXTR IRRIGATION AND DEBRIDEMENT EXTREMITY (Left )EMITY (Left )  10/01/2020   HERNIA REPAIR     I & D EXTREMITY Left 10/01/2020   Procedure: IRRIGATION AND DEBRIDEMENT EXTREMITY;  Surgeon: Roby Lofts, MD;  Location: [facility] OR;  Service: Orthopedics;  Laterality: Left;   OPEN REDUCTION INTERNAL FIXATION (ORIF) DISTAL HUMERUS FRACTURE (Left )  10/01/2020   ORIF HUMERUS FRACTURE Left 10/01/2020   Procedure: OPEN REDUCTION INTERNAL FIXATION (ORIF) DISTAL HUMERUS FRACTURE;  Surgeon: Roby Lofts, MD;  Location: [facility] OR;  Service: Orthopedics;  Laterality: Left;   ORIF HUMERUS FRACTURE Left 12/12/2020   Procedure: OPEN REDUCTION INTERNAL FIXATION (ORIF) HUMERAL SHAFT FRACTURE;  Surgeon: Roby Lofts, MD;  Location: [facility] OR;  Service: Orthopedics;  Laterality: Left;   TONSILLECTOMY      Family History  Problem Relation Age of Onset   Anesthesia problems Neg Hx     Social History   Tobacco Use   Smoking status: Never   Smokeless tobacco: Never  Substance Use Topics   Alcohol use: Yes    Alcohol/week: 3.0 - 5.0 standard drinks of alcohol    Types: 3 - 5 Glasses of wine per week   Drug use: No     Allergies  Allergen Reactions   Codeine Nausea And Vomiting    Review of Systems NEGATIVE UNLESS OTHERWISE INDICATED IN HPI      Objective:     BP (!) 148/81 (BP Location: Left Arm)   Pulse 98   Temp 98.2 F (36.8  C) (Temporal)   Ht 5\' 5"  (1.651 m)   Wt 121 lb 3.2 oz (55 kg)   SpO2 98%   BMI 20.17 kg/m   Wt Readings from Last 3 Encounters:  12/17/21 121 lb 3.2 oz (55 kg)  11/01/21 123 lb 12.8 oz (56.2 kg)  07/05/21 129 lb 12.8 oz (58.9 kg)    BP Readings from Last 3 Encounters:  12/17/21 (!) 148/81  11/01/21 136/88  07/05/21 132/84     Physical Exam     Assessment & Plan:   Problem List Items Addressed This Visit   None    No orders of the defined types were placed in this encounter.    No follow-ups on file.  This note was prepared with assistance of 09/02/21. Occasional wrong-word or sound-a-like substitutions may have occurred due to the inherent limitations of voice recognition software.  Time Spent: *** minutes of total time was spent on the date of the encounter performing the following actions: chart review prior to seeing the patient, obtaining history, performing a medically necessary exam, counseling on the treatment plan, placing orders, and documenting in our EHR.       Hashir Deleeuw M Lukas Pelcher, PA-C

## 2022-02-21 ENCOUNTER — Telehealth: Payer: Self-pay | Admitting: Family Medicine

## 2022-02-21 NOTE — Telephone Encounter (Signed)
Patient states it is past time for her routine mamo.  States she had an insect bite and was seen on 6/20.  States she is still having pain and bruising.  Would like to know if Dr. Jimmey Ralph could order diagnostic mamo based on 6/20 appt?

## 2022-02-21 NOTE — Telephone Encounter (Signed)
States Rebekah Campbell had recommended mamo at that time.  But did not follow through.

## 2022-02-27 ENCOUNTER — Other Ambulatory Visit: Payer: Self-pay | Admitting: *Deleted

## 2022-02-27 MED ORDER — TRAZODONE HCL 100 MG PO TABS
100.0000 mg | ORAL_TABLET | Freq: Every day | ORAL | 3 refills | Status: DC
Start: 2022-02-27 — End: 2022-09-01

## 2022-03-19 ENCOUNTER — Ambulatory Visit: Payer: Commercial Managed Care - HMO | Admitting: Family Medicine

## 2022-03-20 ENCOUNTER — Ambulatory Visit (INDEPENDENT_AMBULATORY_CARE_PROVIDER_SITE_OTHER): Payer: Commercial Managed Care - HMO | Admitting: Family Medicine

## 2022-03-20 ENCOUNTER — Encounter: Payer: Self-pay | Admitting: Family Medicine

## 2022-03-20 VITALS — BP 110/74 | HR 82 | Temp 98.0°F | Ht 65.0 in | Wt 125.6 lb

## 2022-03-20 DIAGNOSIS — F900 Attention-deficit hyperactivity disorder, predominantly inattentive type: Secondary | ICD-10-CM | POA: Diagnosis not present

## 2022-03-20 DIAGNOSIS — R7989 Other specified abnormal findings of blood chemistry: Secondary | ICD-10-CM

## 2022-03-20 DIAGNOSIS — F3342 Major depressive disorder, recurrent, in full remission: Secondary | ICD-10-CM | POA: Diagnosis not present

## 2022-03-20 DIAGNOSIS — I10 Essential (primary) hypertension: Secondary | ICD-10-CM | POA: Diagnosis not present

## 2022-03-20 MED ORDER — AMPHETAMINE-DEXTROAMPHETAMINE 30 MG PO TABS: 30.0000 mg | ORAL_TABLET | Freq: Two times a day (BID) | ORAL | 0 refills | Status: DC

## 2022-03-20 NOTE — Assessment & Plan Note (Signed)
On vitamin D supplementation. Check vitamin D next blood draw.

## 2022-03-20 NOTE — Assessment & Plan Note (Signed)
Stable lexapro 20mg  daily.

## 2022-03-20 NOTE — Patient Instructions (Signed)
It was very nice to see you today!  I will refill your medications today.  Please call to schedule your mammogram at [phone].  I will see you back in 3-6 months or sooner if needed.   Take care, Dr Jerline Pain  PLEASE NOTE:  If you had any lab tests please let us know if you have not heard back within a few days. You may see your results on mychart before we have a chance to review them but we will give you a call once they are reviewed by [place]. If we ordered any referrals today, please let us know if you have not heard from their office within the next week.   Please try these tips to maintain a healthy lifestyle:  Eat at least 3 REAL meals and 1-2 snacks per day.  Aim for no more than 5 hours between eating.  If you eat breakfast, please do so within one hour of getting up.   Each meal should contain half fruits/vegetables, one quarter protein, and one quarter carbs (no bigger than a computer mouse)  Cut down on sweet beverages. This includes juice, soda, and sweet tea.   Drink at least 1 glass of water with each meal and aim for at least 8 glasses per day  Exercise at least 150 minutes every week.

## 2022-03-20 NOTE — Progress Notes (Signed)
   Rebekah Campbell is a 56 y.o. female who presents today for an office visit.  Assessment/Plan:  Chronic Problems Addressed Today: Attention deficit hyperactivity disorder (ADHD), predominantly inattentive type Database without red flags. Medications help with ability to stay focused and on task. No significant side effects. She is on adderall 30mg  twice daily. Will refill today. Will follow up 3-6 months.   Essential hypertension Blood pressure at goal on HCTZ 25mg  daily.   Recurrent major depressive disorder, in full remission ([facility]) Stable lexapro 20mg  daily.   Low vitamin D level On vitamin D supplementation. Check vitamin D next blood draw.     Subjective:  HPI:  See A/p for status of chronic conditions.         Objective:  Physical Exam: BP 110/74   Pulse 82   Temp 98 F (36.7 C) (Temporal)   Ht 5\' 5"  (1.651 m)   Wt 125 lb 9.6 oz (57 kg)   SpO2 98%   BMI 20.90 kg/m   Wt Readings from Last 3 Encounters:  03/20/22 125 lb 9.6 oz (57 kg)  12/17/21 121 lb 3.2 oz (55 kg)  11/01/21 123 lb 12.8 oz (56.2 kg)    Gen: No acute distress, resting comfortably CV: Regular rate and rhythm with no murmurs appreciated Pulm: Normal work of breathing, clear to auscultation bilaterally with no crackles, wheezes, or rhonchi Neuro: Grossly normal, moves all extremities Psych: Normal affect and thought content      Cassadi Purdie M. Jerline Pain, MD 03/20/2022 10:33 AM

## 2022-03-20 NOTE — Assessment & Plan Note (Signed)
Database without red flags. Medications help with ability to stay focused and on task. No significant side effects. She is on adderall 30mg  twice daily. Will refill today. Will follow up 3-6 months.

## 2022-03-20 NOTE — Assessment & Plan Note (Signed)
Blood pressure at goal on HCTZ 25 mg daily. 

## 2022-04-09 IMAGING — MG DIGITAL SCREENING BILAT W/ TOMO W/ CAD
6 of 10 series · 6 of 30 positions shown · non-contrast
Comparison: Previous exam(s).

CLINICAL DATA: Screening.

EXAM:
DIGITAL SCREENING BILATERAL MAMMOGRAM WITH TOMO AND CAD

[L CC synth-2D]
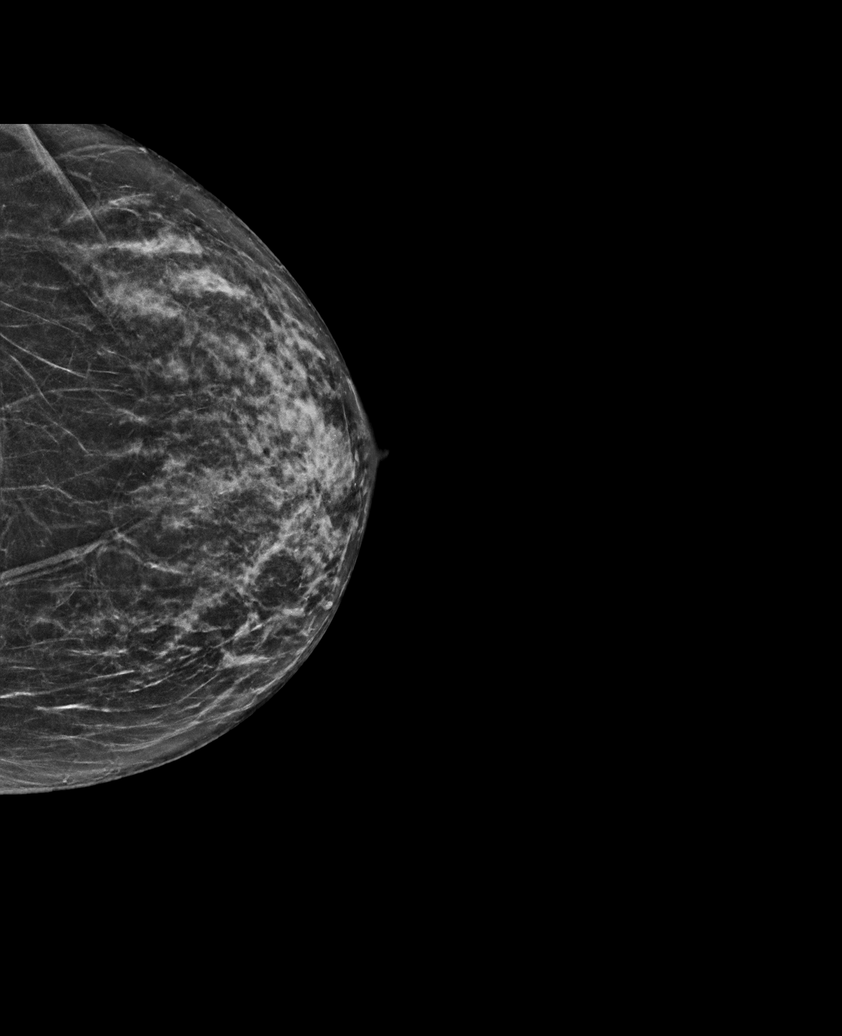

[R CC synth-2D (1 of 2)]
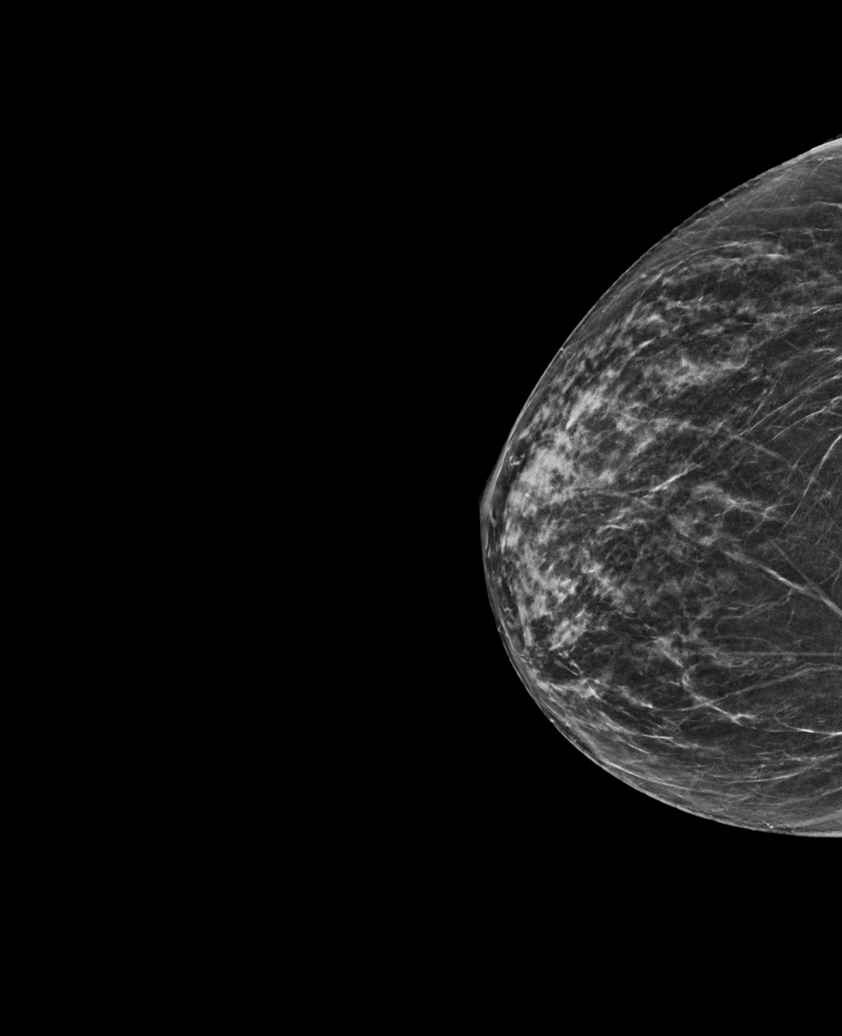

[R CC synth-2D (2 of 2)]
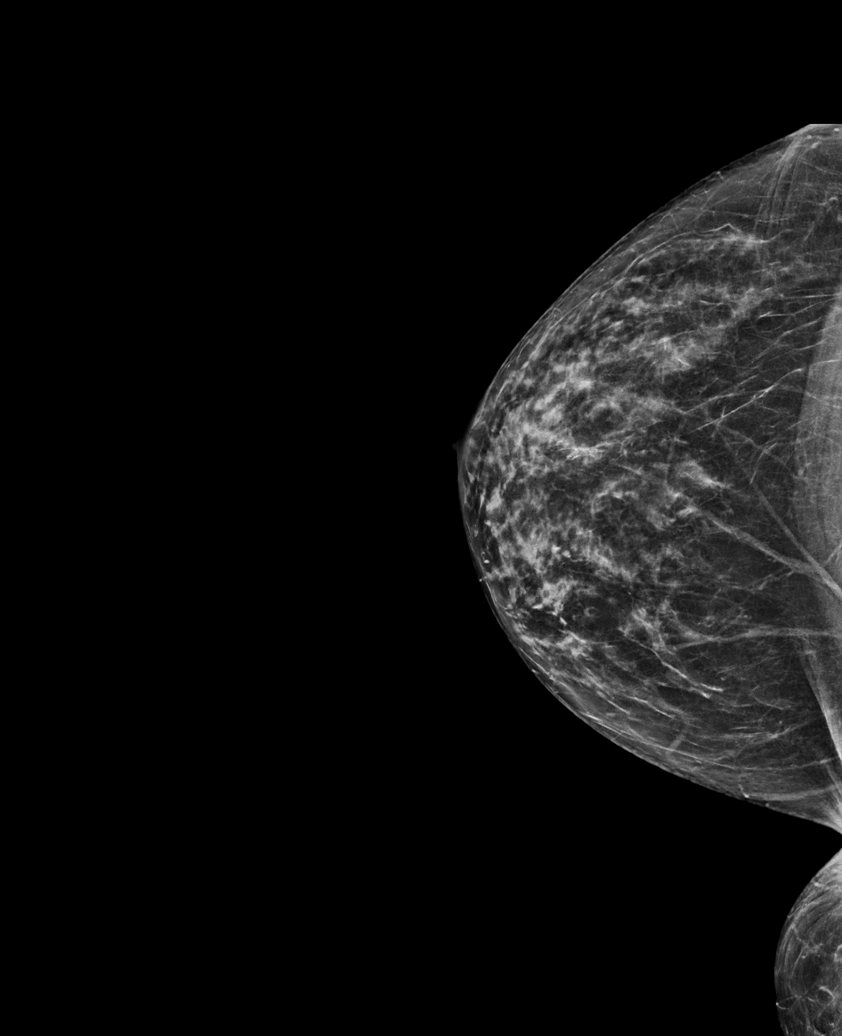

[R MLO synth-2D]
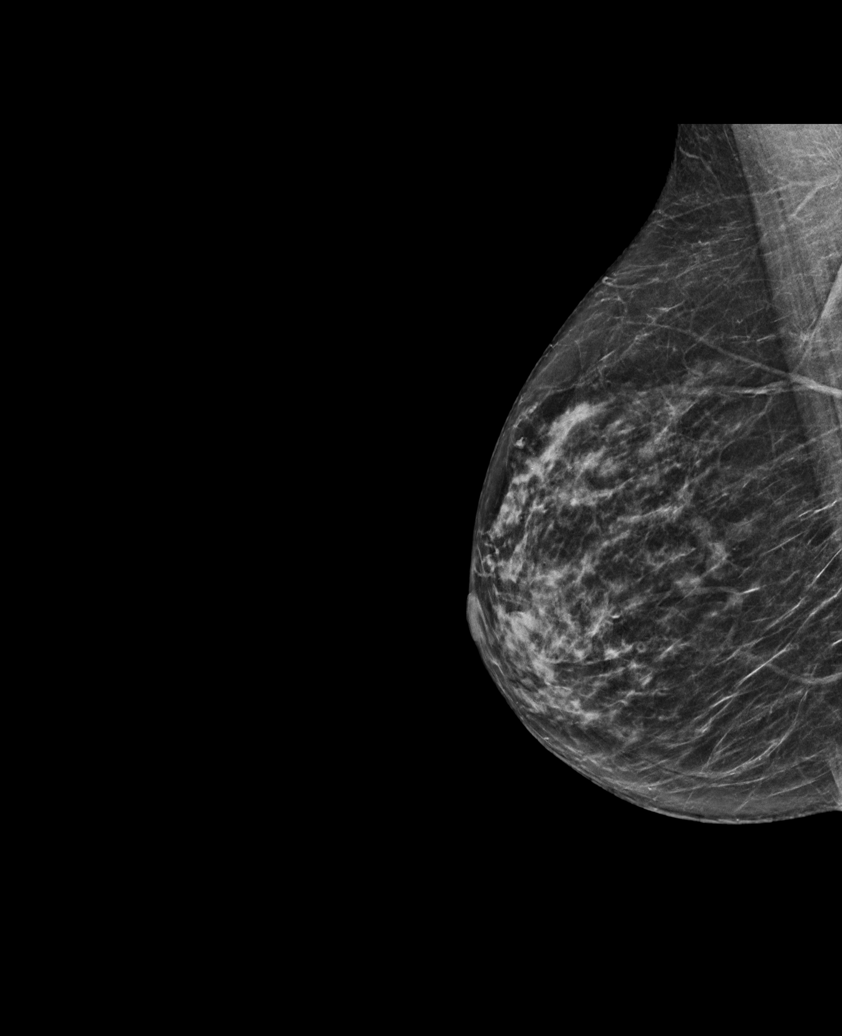

[L MLO synth-2D]
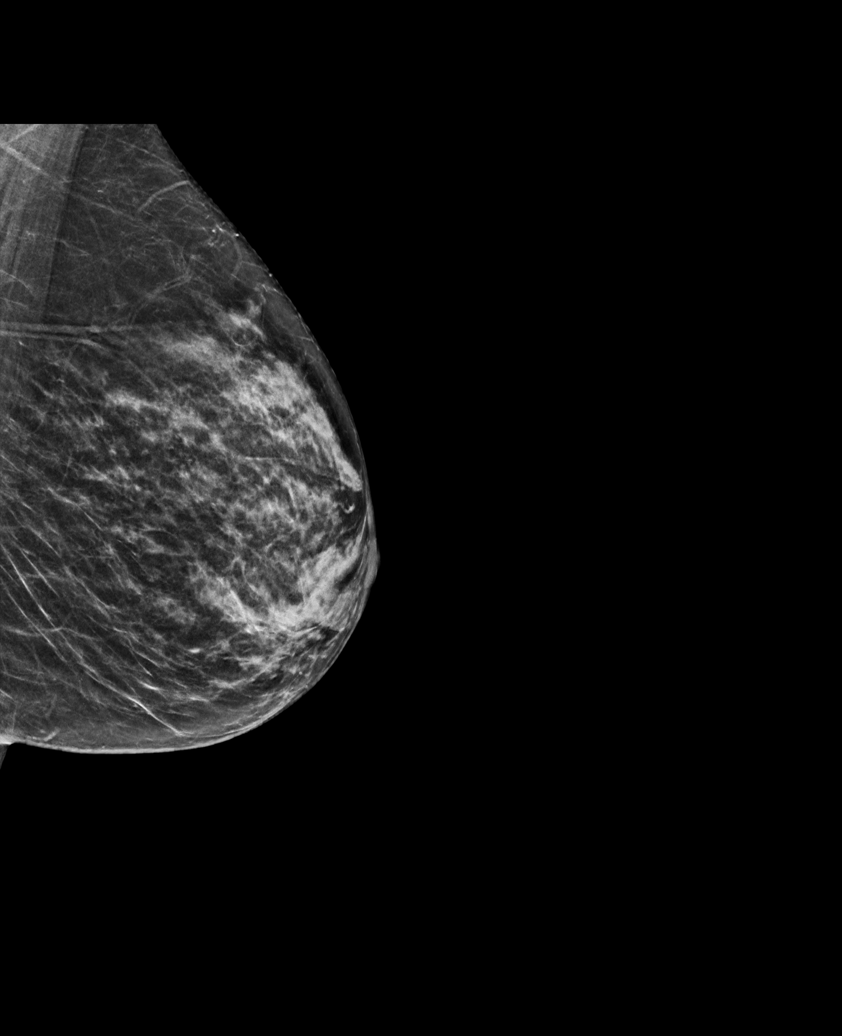

[R MLO tomo · tomo slice 29/57.0]
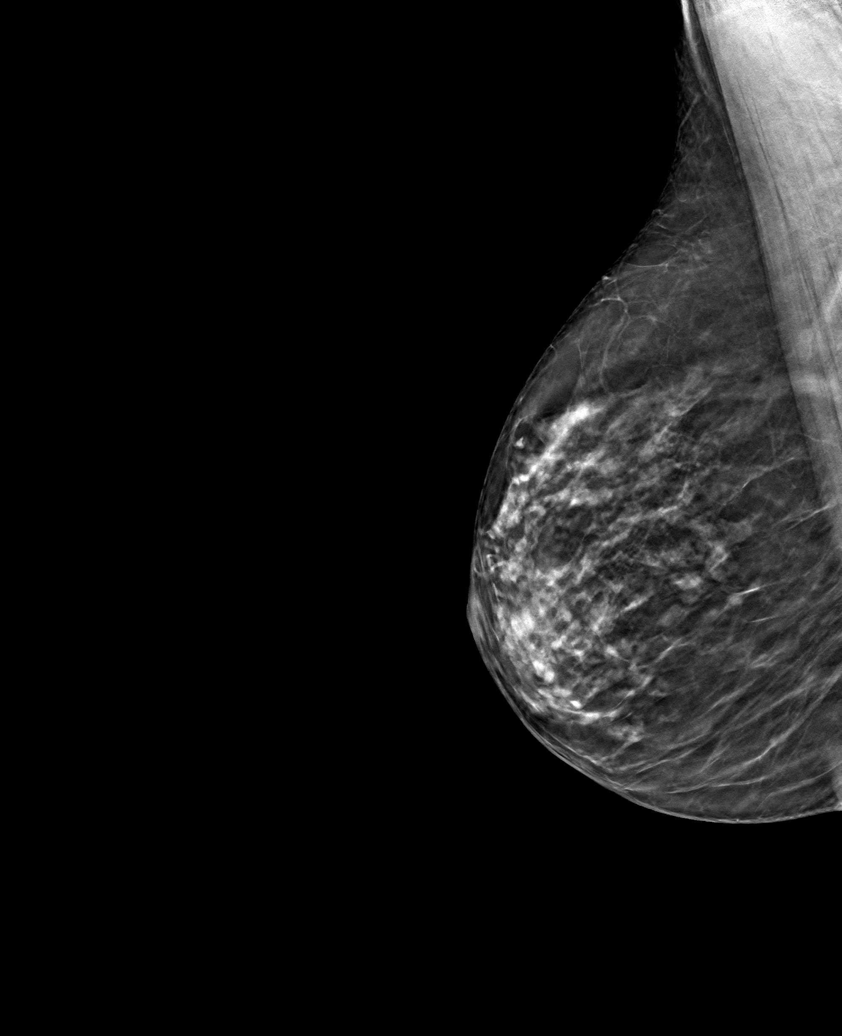

[6 of 30 positions shown; findings below may reference images not displayed]

ACR Breast Density Category c: The breast tissue is heterogeneously
dense, which may obscure small masses.
FINDINGS: There are no findings suspicious for malignancy. Images were
processed with CAD.
IMPRESSION: No mammographic evidence of malignancy. A result letter of this
screening mammogram will be mailed directly to the patient.

RECOMMENDATION:
Screening mammogram in one year. (Code:[id number]-[id number])

BI-RADS CATEGORY  1: Negative.

## 2022-04-18 ENCOUNTER — Other Ambulatory Visit: Payer: Self-pay | Admitting: Family Medicine

## 2022-04-21 ENCOUNTER — Telehealth: Payer: Self-pay | Admitting: Family Medicine

## 2022-04-21 NOTE — Telephone Encounter (Signed)
Pt states: -pharmacy has 20 mg tablets,  -90 day supply requested   LAST APPOINTMENT DATE:  03/20/22 OV with PCP   NEXT APPOINTMENT DATE: N/a   MEDICATION: escitalopram (LEXAPRO)    Is the patient out of medication?  Yes   PHARMACY: [facility] [phone] - [place], [place] - [address] [place], [place] [address] Phone: [phone]  Fax: [phone] DEA #: [id number]

## 2022-04-22 MED ORDER — ESCITALOPRAM OXALATE 10 MG PO TABS: 20.0000 mg | ORAL_TABLET | Freq: Every day | ORAL | 0 refills | Status: DC

## 2022-04-24 NOTE — Telephone Encounter (Addendum)
Pt states: -pharmacy still filled prescription for 10mg  tablets, instead of 20mg  tablets. -- Sig: Take 2 tablets (20 mg total) by mouth daily. TAKE 1 TABLET(10 MG) BY MOUTH DAILY  -has not had meds for 7 days and is feeling well.  -not experiencing any side effects.   Pt asks: -since she is feeling well after 7 days, can she stop all together?   Pt requests: -call back

## 2022-04-25 NOTE — Telephone Encounter (Signed)
Left message to return call to our office at their convenience.  

## 2022-04-25 NOTE — Telephone Encounter (Signed)
See note

## 2022-04-25 NOTE — Telephone Encounter (Signed)
If she is feeling well off meds it is ok for her to stay off though would like for her to let us know if symptoms return.  Algis Greenhouse. Jerline Pain, MD 04/25/2022 9:19 AM

## 2022-04-30 ENCOUNTER — Other Ambulatory Visit: Payer: Self-pay | Admitting: Family Medicine

## 2022-05-01 NOTE — Telephone Encounter (Signed)
Spoke with patient stated doing good with out medication  Last dose was on 04/15/2022 Now she is able to sleep better and make better decisions. Not experiencing any side effects

## 2022-05-01 NOTE — Telephone Encounter (Signed)
Left message to return call to our office at their convenience.  

## 2022-05-06 ENCOUNTER — Ambulatory Visit: Payer: Managed Care, Other (non HMO) | Admitting: Family Medicine

## 2022-05-16 ENCOUNTER — Telehealth: Payer: Self-pay | Admitting: Family Medicine

## 2022-05-16 NOTE — Telephone Encounter (Signed)
Patient states [facility] told Patient they are unable to fill Adderall RX (30 MG) because they are out of stock until next year.  Pharmacist told Patient to requests new RX for Adderall 20 MG -1 & 1/2 tablets 2 x per day (which are in stock).  Patient states she is out of the above medication.  Requests RX for Adderall 20 MG -1&1/2 tablets 2 x per day (total MG per day=60 MG ) with refills be sent to   [id number] - [place], [place] - [address] Phone: [phone]  Fax: [phone]

## 2022-05-19 MED ORDER — AMPHETAMINE-DEXTROAMPHETAMINE 20 MG PO TABS: 30.0000 mg | ORAL_TABLET | Freq: Two times a day (BID) | ORAL | 0 refills | Status: DC

## 2022-05-19 NOTE — Telephone Encounter (Signed)
Patient requests to be called at ph# [phone]  to be given status of RX request-Pharmacy may run out of the Adderall 20MG  and Patient is out of medication.   Also, Patient states she went off Lexapro and feels great/feels awesome!

## 2022-05-19 NOTE — Telephone Encounter (Signed)
See note

## 2022-05-20 ENCOUNTER — Telehealth: Payer: Self-pay | Admitting: Family Medicine

## 2022-05-20 NOTE — Telephone Encounter (Signed)
[facility].   Pt requests: -[facility]-dextroamphetamine (ADDERALL) 30 MG tablet [[id number]]    [facility]  [phone] Fax: [phone]  Store ID: [phone]  [address],  [place], [place] [address]

## 2022-05-20 NOTE — Telephone Encounter (Signed)
Patient calling bc she only has 1 pill left for adderall - stated office has to call [employer] prior auth at [phone] to get her 20mg  approved. Patient does not want to go over the weekend without he medication.

## 2022-05-21 ENCOUNTER — Other Ambulatory Visit: Payer: Self-pay

## 2022-05-21 MED ORDER — AMPHETAMINE-DEXTROAMPHETAMINE 30 MG PO TABS: 30.0000 mg | ORAL_TABLET | Freq: Two times a day (BID) | ORAL | 0 refills | Status: DC

## 2022-05-21 NOTE — Telephone Encounter (Signed)
Refill request sent to PCP.

## 2022-05-21 NOTE — Telephone Encounter (Signed)
[facility] out of stock, [facility] [place] All scripts cancelled

## 2022-06-02 ENCOUNTER — Other Ambulatory Visit: Payer: Self-pay | Admitting: Family Medicine

## 2022-07-02 ENCOUNTER — Telehealth: Payer: Self-pay | Admitting: Family Medicine

## 2022-07-02 NOTE — Telephone Encounter (Signed)
  LAST APPOINTMENT DATE:  03/20/22  NEXT APPOINTMENT DATE: 07/04/22  MEDICATION:   amphetamine-dextroamphetamine (ADDERALL) 30 MG tablet   Is the patient out of medication? No-enough until 07/04/22 appointment  [id number] [place], [place] - [address] Phone: [phone]  Fax: [phone]      [facility] [place] [place] [facility] with 30 MG tablets of Adderall  Let patient know to contact pharmacy at the end of the day to make sure medication is ready.  Please notify patient to allow 48-72 hours to process

## 2022-07-04 ENCOUNTER — Encounter: Payer: Self-pay | Admitting: Family Medicine

## 2022-07-04 ENCOUNTER — Ambulatory Visit (INDEPENDENT_AMBULATORY_CARE_PROVIDER_SITE_OTHER): Payer: 59 | Admitting: Family Medicine

## 2022-07-04 VITALS — BP 136/89 | HR 96 | Temp 97.7°F | Ht 65.0 in | Wt 126.2 lb

## 2022-07-04 DIAGNOSIS — F3342 Major depressive disorder, recurrent, in full remission: Secondary | ICD-10-CM | POA: Diagnosis not present

## 2022-07-04 DIAGNOSIS — R7989 Other specified abnormal findings of blood chemistry: Secondary | ICD-10-CM

## 2022-07-04 DIAGNOSIS — F900 Attention-deficit hyperactivity disorder, predominantly inattentive type: Secondary | ICD-10-CM | POA: Diagnosis not present

## 2022-07-04 DIAGNOSIS — I10 Essential (primary) hypertension: Secondary | ICD-10-CM | POA: Diagnosis not present

## 2022-07-04 DIAGNOSIS — G4700 Insomnia, unspecified: Secondary | ICD-10-CM | POA: Diagnosis not present

## 2022-07-04 MED ORDER — AMPHETAMINE-DEXTROAMPHETAMINE 30 MG PO TABS: 30.0000 mg | ORAL_TABLET | Freq: Two times a day (BID) | ORAL | 0 refills | Status: DC

## 2022-07-04 NOTE — Assessment & Plan Note (Signed)
She is taking 1000 IUs daily over-the-counter.  We can recheck vitamin D next blood draw.

## 2022-07-04 NOTE — Assessment & Plan Note (Signed)
She is doing well off Lexapro.  No SI or HI.  She would like to stay off medications for now.  She will let me know if she has any returning symptoms.

## 2022-07-04 NOTE — Patient Instructions (Signed)
It was very nice to see you today!  I am glad that you are doing well.  I will refill your medications today.  Come back in 3-6 months for a medication recheck. Please come back sooner if needed.   Take care, Dr Jerline Pain  PLEASE NOTE:  If you had any lab tests, please let us know if you have not heard back within a few days. You may see your results on mychart before we have a chance to review them but we will give you a call once they are reviewed by [place].   If we ordered any referrals today, please let us know if you have not heard from their office within the next week.   If you had any urgent prescriptions sent in today, please check with the pharmacy within an hour of our visit to make sure the prescription was transmitted appropriately.   Please try these tips to maintain a healthy lifestyle:  Eat at least 3 REAL meals and 1-2 snacks per day.  Aim for no more than 5 hours between eating.  If you eat breakfast, please do so within one hour of getting up.   Each meal should contain half fruits/vegetables, one quarter protein, and one quarter carbs (no bigger than a computer mouse)  Cut down on sweet beverages. This includes juice, soda, and sweet tea.   Drink at least 1 glass of water with each meal and aim for at least 8 glasses per day  Exercise at least 150 minutes every week.

## 2022-07-04 NOTE — Progress Notes (Signed)
   Rebekah Campbell is a 57 y.o. female who presents today for an office visit.  Assessment/Plan:  Chronic Problems Addressed Today: Attention deficit hyperactivity disorder (ADHD), predominantly inattentive type Database reviewed today without any red flags.  She is on Adderall 30 mg twice daily.  Tolerating well.  No significant side effects.  Medications help with her ability to stay focused and on task.  Will refill today.  Follow-up in 3 to 6 months.  Essential hypertension Blood pressure well-controlled today on HCTZ 25 mg daily.  Recurrent major depressive disorder, in full remission ([place]) She is doing well off Lexapro.  No SI or HI.  She would like to stay off medications for now.  She will let me know if she has any returning symptoms.  Insomnia Stable on trazodone 100 mg nightly as needed.  Low vitamin D level She is taking 1000 IUs daily over-the-counter.  We can recheck vitamin D next blood draw.     Subjective:  HPI:  See A/p for status of chronic conditions.    She was last seen here about 3 months ago. She is doing well and has no acute concerns today.  Since her last visit she discontinued the Lexapro.  She has done well without it.  She did not have any significant withdrawal symptoms.  Depression symptoms are well-controlled.  She is also started taking vitamin D 1000 IUs daily.  She has been compliant with Adderall 30 mg twice daily without any significant side effects.      Objective:  Physical Exam: BP 136/89   Pulse 96   Temp 97.7 F (36.5 C) (Temporal)   Ht 5\' 5"  (1.651 m)   Wt 126 lb 3.2 oz (57.2 kg)   SpO2 100%   BMI 21.00 kg/m   Gen: No acute distress, resting comfortably CV: Regular rate and rhythm with no murmurs appreciated Pulm: Normal work of breathing, clear to auscultation bilaterally with no crackles, wheezes, or rhonchi Neuro: Grossly normal, moves all extremities Psych: Normal affect and thought content      Jaymir Struble M. Jerline Pain,  MD 07/04/2022 10:13 AM

## 2022-07-04 NOTE — Assessment & Plan Note (Signed)
Database reviewed today without any red flags.  She is on Adderall 30 mg twice daily.  Tolerating well.  No significant side effects.  Medications help with her ability to stay focused and on task.  Will refill today.  Follow-up in 3 to 6 months.

## 2022-07-04 NOTE — Telephone Encounter (Signed)
Pt seen in office today and already sent in refill

## 2022-07-04 NOTE — Assessment & Plan Note (Signed)
Stable on trazodone 100 mg nightly as needed. 

## 2022-07-04 NOTE — Assessment & Plan Note (Signed)
Blood pressure well-controlled today on HCTZ 25 mg daily.

## 2022-08-29 ENCOUNTER — Other Ambulatory Visit: Payer: Self-pay | Admitting: Family Medicine

## 2022-08-29 MED ORDER — AMPHETAMINE-DEXTROAMPHETAMINE 30 MG PO TABS: 30.0000 mg | ORAL_TABLET | Freq: Two times a day (BID) | ORAL | 0 refills | Status: DC

## 2022-08-29 NOTE — Telephone Encounter (Signed)
  LAST APPOINTMENT DATE:  07/04/22  NEXT APPOINTMENT DATE:  10/06/22  MEDICATION:  amphetamine-dextroamphetamine (ADDERALL) 30 MG tablet   Is the patient out of medication? Yes  [id number] [place], [place] - [address] Phone: [phone] Fax: [phone]

## 2022-09-01 ENCOUNTER — Encounter: Payer: Self-pay | Admitting: Family Medicine

## 2022-09-01 ENCOUNTER — Telehealth: Payer: Self-pay | Admitting: Family Medicine

## 2022-09-01 ENCOUNTER — Ambulatory Visit (INDEPENDENT_AMBULATORY_CARE_PROVIDER_SITE_OTHER): Payer: 59 | Admitting: Family Medicine

## 2022-09-01 VITALS — BP 138/88 | HR 116 | Temp 98.4°F | Ht 65.0 in | Wt 127.4 lb

## 2022-09-01 DIAGNOSIS — F3342 Major depressive disorder, recurrent, in full remission: Secondary | ICD-10-CM

## 2022-09-01 DIAGNOSIS — R0789 Other chest pain: Secondary | ICD-10-CM

## 2022-09-01 DIAGNOSIS — I10 Essential (primary) hypertension: Secondary | ICD-10-CM

## 2022-09-01 DIAGNOSIS — F419 Anxiety disorder, unspecified: Secondary | ICD-10-CM | POA: Diagnosis not present

## 2022-09-01 MED ORDER — HYDROXYZINE PAMOATE 25 MG PO CAPS: 25.0000 mg | ORAL_CAPSULE | Freq: Three times a day (TID) | ORAL | 0 refills | Status: AC | PRN

## 2022-09-01 MED ORDER — ESCITALOPRAM OXALATE 10 MG PO TABS: 10.0000 mg | ORAL_TABLET | Freq: Every day | ORAL | 0 refills | Status: DC

## 2022-09-01 NOTE — Patient Instructions (Addendum)
-  Take Hydrochloride '25mg'$ , 1 tablet in the morning. Take blood pressure and heart rate once in the morning and once in the evening. Record readings and bring to your next appointment.  -START Lexapro '10mg'$ , 1 tablet a day, prefer to take at night time to help with side effects, such as nausea.  - START Hydroxyzine '25mg'$ , 1 tablet every 8 hours as needed for severe anxiety.  -Only take 1/2 dose of Adderall if needed until anxiety and blood pressure are improved.  -In moments of severe anxiety, take deep breaths, count to 10, do something that you can control, such as taking a run.  -If you develop chest pain, chest pressure, or shortness of breath. Please go to the nearest emergency department or call 911.  -You got this!

## 2022-09-01 NOTE — Telephone Encounter (Signed)
Final disposition was see PCP within 24 hours. Patient was seen by Valarie Merino on 09/01/22 @ 2pm @ [facility].   Patient Name: Rebekah Campbell Gender: Female DOB: 1966-04-11 Age: 57 Y 9 M 13 D Return Phone Number: [phone] (Primary), [phone] (Secondary) Address: City/ State/ Zip: [place]  [address] Client [place] [facility] [place] [place] [facility] [place] [facility] Contact Type Call Who Is Calling Patient / Member / Family / Caregiver Call Type Triage / Clinical Relationship To Patient Self Return Phone Number [phone] (Primary) Chief Complaint Anxiety and Panic Attack Reason for Call Symptomatic / Request for [place] states that she is calling due to her blood pressure yesterday was 193/108 and she experiencing anxiety symptoms as well today . Her doctors name is Dr. Jerline Pain. Her blood pressure now is 150/105 with heart rate of 114. She does take Hydrochloride , and she normally doesn't take it for high blood pressure, and she has taken one yesterday and last night as well. Translation No Nurse Assessment Nurse: Nicole Cella, RN, [place] Date/Time (Eastern Time): 09/01/2022 8:44:03 AM Confirm and document reason for call. If symptomatic, describe symptoms. ---Caller states having high blood pressure with anxiety. Her blood pressure now is 150/105 with heart rate of 114, about 70mnutes ago. States being a runner and generally having BP/HR lower at baseline. HZTZ .25 rx for diet but took medication to help manage BP. sx include tremors and poor appetite, also having pain in the back of the neck at base of skull. Does the patient have any new or worsening symptoms? ---Yes Will a triage be completed? ---Yes Related visit to physician within the last 2 weeks? ---No Does the PT have any chronic conditions? (i.e. diabetes, asthma, this includes High risk factors  for pregnancy, etc.) ---No Is this a behavioral health or substance abuse call? ---No PLEASE NOTE: All timestamps contained within this report are represented as [place] Time. CONFIDENTIALTY NOTICE: This fax transmission is intended only for the addressee. It contains information that is legally privileged, confidential or otherwise protected from use or disclosure. If you are not the intended recipient, you are strictly prohibited from reviewing, disclosing, copying using or disseminating any of this information or taking any action in reliance on or regarding this information. If you have received this fax in error, please notify [place] by telephone so that we can arrange for its return to [place] Phone: [phone] Toll-Free: [phone] Fax: [phone]: 2 of 2 Call Id: [id number] Guideline Title Affirmed Question Affirmed Notes Nurse Date/Time (Eilene GhaziTime) Blood Pressure - High Systolic BP >= [id number] Diastolic >= [id number], RN, [place] 09/01/2022 8:47:49 AM Disp. Time (Eilene GhaziTime) Disposition Final User 09/01/2022 8:52:55 AM See PCP within 24 Hours Yes GNicole Cella RN, [place] Final Disposition 09/01/2022 8:52:55 AM See PCP within 24 Hours Yes [place], RN, [place] Caller Disagree/Comply Comply Caller Understands Yes PreDisposition Did not know what to do Care Advice Given Per Guideline SEE PCP WITHIN 24 HOURS: * IF OFFICE WILL BE OPEN: You need to be examined within the next 24 hours. Call your doctor (or NP/PA) when the office opens and make an appointment. HIGH BLOOD PRESSURE: * Untreated high blood pressure may cause damage to your heart, brain, kidneys, and eyes. * Treatment of high blood pressure can reduce the risk of stroke, heart attack, and heart failure. * The goal of blood pressure treatment depends on your age and if you  have other health problems. A general goal is less than 130/80. But you should talk to your doctor about a goal that is  right for you. CALL BACK IF: * Weakness or numbness of the face, arm or leg on one side of the body occurs * Difficulty walking, difficulty talking, or severe headache occurs * Chest pain or difficulty breathing occurs * You become worse CARE ADVICE given per High Blood Pressure (Adult) guideline. Comments User: Prudence Davidson, RN Date/Time Eilene Ghazi Time): 09/01/2022 8:56:57 AM Transferred to the office to schedule appt. Referrals REFERRED TO PCP OFFICE

## 2022-09-01 NOTE — Telephone Encounter (Signed)
Patient BP 198/108- Having anxiety symptoms per partner. Unable to describe. Pt has been triaged.

## 2022-09-01 NOTE — Progress Notes (Unsigned)
Acute Office Visit   Subjective:  Patient ID: Rebekah Campbell, female    DOB: [birth year], 57 y.o.   MRN: [id number]  Chief Complaint  Patient presents with   Hypertension    Pt states she had B/P 179/90 .Denies chest pain,SOB, Pt states she has alot stress right now. Pt states her hr this am was 114 and her normal in 99s.    Hypertension   Patient is a 57 year old caucasian female. Patient reports her blood pressure is elevated. She has a history of hypertension. She is prescribed HCTZ '25mg'$ , 1 tablet daily. However, patient reports she hardly takes medication, only   She reports on Saturday she started having "high emotions", chest pressure-not a pain. Patient received 3 phone calls from patients to about needing help with insurance. One of her particular patients needed a different type of insurance due to cancer. Also, has personal stress occurring this month.   She reports she has had depression previously, was taking Lexapro. Stopped taking August 2023. Stopped taking Trazodone  in December 2023. Taking Adderall '30mg'$  once a day when it is prescribed as twice a day.   Blood pressure on Saturday-unsure of measurement, but knows it was elevated; went for run on Saturday; still severe anxious, Sunday- still had the same symptoms-179/118 hightest-patient was having palpitations yesterday, chest pressure-not pain; short of breath; concerned and feared because father had a stroke  Didn't eat; nausea and vomiting on Saturday. Loss of appetite yesterday; pressure in head yesterday-temporal and occipital-denies dizziness and lightheadedness   Today-feel a little better today; drinking a lot of water today; blood pressure 159/110 with a pulse of 114; no chest pain, no chest pressure, denies D/L; N/v; headpressure  Took a 1/4 of a pill;   Taking HCTZ '25mg'$  hardly ever except when eating sushi or a lot of foods with MSG. Took a last night.   Left arm upper throbb-satrted today; constant,  numbness and tingling-intermittent  ROS See HPI above      Objective:    BP 138/88 (BP Location: Right Arm, Cuff Size: Normal)   Pulse (!) 116   Temp 98.4 F (36.9 C)   Ht '5\' 5"'$  (1.651 m)   Wt 127 lb 6 oz (57.8 kg)   SpO2 97%   BMI 21.20 kg/m  {Vitals History (Optional):23777}  Physical Exam Vitals reviewed.  Constitutional:      General: She is not in acute distress.    Appearance: Normal appearance. She is not ill-appearing, toxic-appearing or diaphoretic.  HENT:     Head: Normocephalic and atraumatic.  Cardiovascular:     Rate and Rhythm: Regular rhythm. Tachycardia present.     Heart sounds: No murmur heard.    No friction rub. No gallop.  Pulmonary:     Effort: Pulmonary effort is normal. No respiratory distress.     Breath sounds: Normal breath sounds.  Musculoskeletal:        General: Normal range of motion.  Skin:    General: Skin is warm and dry.  Neurological:     Mental Status: She is alert.  Psychiatric:        Attention and Perception: She does not perceive auditory or visual hallucinations.        Mood and Affect: Mood is anxious.        Speech: Speech is rapid and pressured.        Behavior: Behavior normal. Behavior is cooperative.        Cognition and Memory:  Cognition normal.        Judgment: Judgment normal.     No results found for any visits on 09/01/22.      Assessment & Plan:  Essential hypertension  Chest pressure -     EKG 12-Lead -     Troponin I (High Sensitivity)  Recurrent major depressive disorder, in full remission ([place])  Anxiety  Other orders -     Escitalopram Oxalate; Take 1 tablet (10 mg total) by mouth daily.  Dispense: 30 tablet; Refill: 0 -     hydrOXYzine Pamoate; Take 1 capsule (25 mg total) by mouth every 8 (eight) hours as needed for up to 5 days.  Dispense: 15 capsule; Refill: 0    Return in about 2 weeks (around 09/15/2022) for Follow up with Dr. Dimas Chyle .  Valarie Merino, NP

## 2022-09-02 NOTE — Telephone Encounter (Signed)
See note

## 2022-09-03 DIAGNOSIS — R0789 Other chest pain: Secondary | ICD-10-CM | POA: Insufficient documentation

## 2022-09-03 NOTE — Assessment & Plan Note (Signed)
Advised to go to the closes emergency department if she developed chest pain, chest pressure, or shortness of breath. EKG completed today. Normal sinus rhythm with no ST elevation. Compared with previous EKG on 10/02/2020 with no change. Ordered Troponin to confirm that she had not experienced heart damage over the weekend.

## 2022-09-03 NOTE — Assessment & Plan Note (Signed)
Recommend to restart Lexapro '10mg'$ , 1 tablet a day, prefer to take at night time to help with side effects, such as nausea. Patient scored high on her PHQ-9 depression screening at today's visit. She was previously on this medication and she reports it was effective.

## 2022-09-03 NOTE — Assessment & Plan Note (Signed)
Recommend to start taking HCTZ '25mg'$ , 1 tablet in the morning. Monitor BP BID. Record readings and bring to your next appointment with Dr. Jerline Pain. Concerned that she is prescribed this medication daily, but has not been taking it as prescribed.

## 2022-09-03 NOTE — Assessment & Plan Note (Signed)
Prescribed a short course of Hydroxyzine '25mg'$ , 1 tablet every 8 hours as needed for severe anxiety. Also, she is restarting Lexapro that will provide that long acting stability for anxiety. Recommend to only take 1/2 dose of Adderall if needed until anxiety and blood pressure are improved. Advised during moments of severe anxiety, take deep breaths, count to 10, do something that she can control, such as taking a run.

## 2022-09-18 ENCOUNTER — Ambulatory Visit (INDEPENDENT_AMBULATORY_CARE_PROVIDER_SITE_OTHER): Payer: 59 | Admitting: Family Medicine

## 2022-09-18 ENCOUNTER — Encounter: Payer: Self-pay | Admitting: Family Medicine

## 2022-09-18 VITALS — BP 151/94 | HR 107 | Temp 97.5°F | Ht 65.0 in | Wt 127.2 lb

## 2022-09-18 DIAGNOSIS — F3342 Major depressive disorder, recurrent, in full remission: Secondary | ICD-10-CM

## 2022-09-18 DIAGNOSIS — I10 Essential (primary) hypertension: Secondary | ICD-10-CM

## 2022-09-18 DIAGNOSIS — F419 Anxiety disorder, unspecified: Secondary | ICD-10-CM

## 2022-09-18 LAB — COMPREHENSIVE METABOLIC PANEL
ALT: 32 U/L (ref 0–35)
AST: 51 U/L — ABNORMAL HIGH (ref 0–37)
Albumin: 4.7 g/dL (ref 3.5–5.2)
Alkaline Phosphatase: 90 U/L (ref 39–117)
BUN: 9 mg/dL (ref 6–23)
CO2: 29 mEq/L (ref 19–32)
Calcium: 9.6 mg/dL (ref 8.4–10.5)
Chloride: 98 mEq/L (ref 96–112)
Creatinine, Ser: 0.62 mg/dL (ref 0.40–1.20)
GFR: 98.57 mL/min (ref >60.00)
Glucose, Bld: 86 mg/dL (ref 70–99)
Potassium: 4.2 mEq/L (ref 3.5–5.1)
Sodium: 136 mEq/L (ref 135–145)
Total Bilirubin: 0.6 mg/dL (ref 0.2–1.2)
Total Protein: 7.4 g/dL (ref 6.0–8.3)

## 2022-09-18 LAB — CBC
HCT: 41.5 % (ref 36.0–46.0)
Hemoglobin: 14.1 g/dL (ref 12.0–15.0)
MCHC: 33.9 g/dL (ref 30.0–36.0)
MCV: 98.4 fl (ref 78.0–100.0)
Platelets: 244 10*3/uL (ref 150.0–400.0)
RBC: 4.22 Mil/uL (ref 3.87–5.11)
RDW: 13.2 % (ref 11.5–15.5)
WBC: 4.2 10*3/uL (ref 4.0–10.5)

## 2022-09-18 LAB — HEMOGLOBIN A1C: Hgb A1c MFr Bld: 5.3 % (ref 4.6–6.5)

## 2022-09-18 LAB — TSH: TSH: 2.38 u[IU]/mL (ref 0.35–5.50)

## 2022-09-18 MED ORDER — BUSPIRONE HCL 7.5 MG PO TABS: 7.5000 mg | ORAL_TABLET | Freq: Two times a day (BID) | ORAL | 0 refills | Status: DC

## 2022-09-18 MED ORDER — AMLODIPINE BESYLATE 5 MG PO TABS: 5.0000 mg | ORAL_TABLET | Freq: Every day | ORAL | 0 refills | Status: DC

## 2022-09-18 NOTE — Progress Notes (Signed)
   Rebekah Campbell is a 57 y.o. female who presents today for an office visit.  Assessment/Plan:  Chronic Problems Addressed Today: Essential hypertension Still slightly above goal today though her home readings are significantly elevated.  Her stress is likely contributing significantly to this.  She is on HCTZ 25 mg daily.  Will add on Norvasc 5 mg daily.  Check labs today.  She will continue to monitor at home over the next couple weeks and follow up with [place] at that time.  Will be working on stress management as below.  Anxiety Anxiety is still not well-controlled.  Her depressive symptoms are well-controlled.  She will continue Lexapro 10 mg daily.  Will add on BuSpar 7.5 mg twice daily.  She will follow-up with me in a couple weeks via MyChart and we can titrate the meds as needed.  This is likely contributing to her elevated blood pressure readings as well.  Check labs today.  Recurrent major depressive disorder, in full remission (HCC) Stable on Lexapro 10 mg daily.     Subjective:  HPI:  See A/P for status of chronic conditions.  Patient is here today for follow-up.  We last saw her a couple of months ago.  Since her last visit she was seen at a different office due to concern for elevated blood pressure reading and worsening anxiety.  She was started on HCTZ 25 mg daily.  She was also restarted on Lexapro 10 mg daily and given a prescription for hydroxyzine 25 mg every 8 hours as needed.  She has not noticed much of a difference the last couple of weeks. She has been compliant with all of her medications.  No significant side effects.  Does admit that she has been under quite a bit of stress recently most related to work.  Her home blood pressure readings are typically in the 170s to 180s.  She does admit to being under stress and she is also very concerned about her blood pressure readings.  She is an avid runner and has been maintaining her normal activity levels.        Objective:  Physical Exam: BP (!) 151/94   Pulse (!) 107   Temp (!) 97.5 F (36.4 C) (Temporal)   Ht 5\' 5"  (1.651 m)   Wt 127 lb 3.2 oz (57.7 kg)   SpO2 98%   BMI 21.17 kg/m   Gen: No acute distress, resting comfortably CV: Regular rate and rhythm with no murmurs appreciated Pulm: Normal work of breathing, clear to auscultation bilaterally with no crackles, wheezes, or rhonchi Neuro: Grossly normal, moves all extremities Psych: Normal affect and thought content      Venda Dice M. Jerline Pain, MD 09/18/2022 9:57 AM

## 2022-09-18 NOTE — Assessment & Plan Note (Signed)
Stable on Lexapro 10 mg daily.

## 2022-09-18 NOTE — Patient Instructions (Signed)
It was very nice to see you today!  We need to work on getting your anxiety and blood pressure lower.  Please start the BuSpar 7.5 mg twice daily for your anxiety.  Please start the Norvasc 5 mg daily for your blood pressure.  Will check blood work today.  Please follow-up with me in a week or 2 to let me know how you are doing.  Take care, Dr Jerline Pain  PLEASE NOTE:  If you had any lab tests, please let us know if you have not heard back within a few days. You may see your results on mychart before we have a chance to review them but we will give you a call once they are reviewed by [place].   If we ordered any referrals today, please let us know if you have not heard from their office within the next week.   If you had any urgent prescriptions sent in today, please check with the pharmacy within an hour of our visit to make sure the prescription was transmitted appropriately.   Please try these tips to maintain a healthy lifestyle:  Eat at least 3 REAL meals and 1-2 snacks per day.  Aim for no more than 5 hours between eating.  If you eat breakfast, please do so within one hour of getting up.   Each meal should contain half fruits/vegetables, one quarter protein, and one quarter carbs (no bigger than a computer mouse)  Cut down on sweet beverages. This includes juice, soda, and sweet tea.   Drink at least 1 glass of water with each meal and aim for at least 8 glasses per day  Exercise at least 150 minutes every week.

## 2022-09-18 NOTE — Assessment & Plan Note (Signed)
Anxiety is still not well-controlled.  Her depressive symptoms are well-controlled.  She will continue Lexapro 10 mg daily.  Will add on BuSpar 7.5 mg twice daily.  She will follow-up with me in a couple weeks via MyChart and we can titrate the meds as needed.  This is likely contributing to her elevated blood pressure readings as well.  Check labs today.

## 2022-09-18 NOTE — Assessment & Plan Note (Signed)
Still slightly above goal today though her home readings are significantly elevated.  Her stress is likely contributing significantly to this.  She is on HCTZ 25 mg daily.  Will add on Norvasc 5 mg daily.  Check labs today.  She will continue to monitor at home over the next couple weeks and follow up with [place] at that time.  Will be working on stress management as below.

## 2022-09-19 NOTE — Progress Notes (Signed)
Please inform patient of the following:  One of her liver numbers was a little bit elevated but stable compared to her previous values.  The rest of her labs are all normal.  I would like for her to follow-up with [place] in a week or 2 to let us know on how she is doing with the new medications restarted.

## 2022-09-21 ENCOUNTER — Other Ambulatory Visit: Payer: Self-pay | Admitting: Family Medicine

## 2022-10-06 ENCOUNTER — Other Ambulatory Visit: Payer: Self-pay | Admitting: Family Medicine

## 2022-10-06 ENCOUNTER — Ambulatory Visit: Payer: 59 | Admitting: Family Medicine

## 2022-10-06 ENCOUNTER — Telehealth: Payer: Self-pay | Admitting: *Deleted

## 2022-10-06 MED ORDER — AMPHETAMINE-DEXTROAMPHETAMINE 30 MG PO TABS: 30.0000 mg | ORAL_TABLET | Freq: Two times a day (BID) | ORAL | 0 refills | Status: DC

## 2022-10-06 NOTE — Telephone Encounter (Signed)
Patient present in office stated doing good today BP 120/88 Stopped taking Rx amlodipine 5mg  due to leg swelling and wt gain  Gain 5lb due to fluid. Feeling like not retaining fluid since stop amlodipine.  Discontinue Rx  Lexapro 10mg , doing good with out it, started running again and also helping with her anxiety  Continue taking Rx Buspar 7.5mg  BID.  Has a F/U appointment with PCP on 12/16/2022

## 2022-10-06 NOTE — Telephone Encounter (Signed)
Noted. Ok with her staying off the amlodipine if her BP is at goal with out it.  Also ok with her staying off the lexapro if her mood is doing well.  Rebekah Campbell. Jimmey Ralph, MD 10/06/2022 12:24 PM

## 2022-10-06 NOTE — Telephone Encounter (Signed)
  Encourage patient to contact the pharmacy for refills or they can request refills through [facility]  LAST APPOINTMENT DATE:  09/18/22  NEXT APPOINTMENT DATE: 12/16/22  MEDICATION:  amphetamine-dextroamphetamine (ADDERALL) 30 MG tablet   Is the patient out of medication? No  PHARMACY:  [id number] - [place],  - [address] [place] [address] Phone: [phone]  Fax: [phone]      Let patient know to contact pharmacy at the end of the day to make sure medication is ready.  Please notify patient to allow 48-72 hours to process

## 2022-10-15 ENCOUNTER — Other Ambulatory Visit: Payer: Self-pay | Admitting: Family Medicine

## 2022-11-14 ENCOUNTER — Other Ambulatory Visit: Payer: Self-pay | Admitting: Family Medicine

## 2022-11-25 ENCOUNTER — Other Ambulatory Visit: Payer: Self-pay | Admitting: Family Medicine

## 2022-11-25 NOTE — Telephone Encounter (Signed)
Prescription Request  11/25/2022  LOV: 09/18/2022  What is the name of the medication or equipment? amphetamine-dextroamphetamine (ADDERALL) 30 MG tablet   Have you contacted your pharmacy to request a refill? Yes   Which pharmacy would you like this sent to?  [id number] - [place], [place] - [address] [place] [address] [place] [address] Phone: [phone] Fax: [phone]   Patient notified that their request is being sent to the clinical staff for review and that they should receive a response within 2 business days.   Please advise at Mobile [phone] (mobile)

## 2022-11-26 ENCOUNTER — Ambulatory Visit (INDEPENDENT_AMBULATORY_CARE_PROVIDER_SITE_OTHER): Payer: 59 | Admitting: Family Medicine

## 2022-11-26 VITALS — BP 124/87 | HR 85 | Temp 97.6°F | Ht 65.0 in | Wt 132.2 lb

## 2022-11-26 DIAGNOSIS — F419 Anxiety disorder, unspecified: Secondary | ICD-10-CM | POA: Diagnosis not present

## 2022-11-26 DIAGNOSIS — F900 Attention-deficit hyperactivity disorder, predominantly inattentive type: Secondary | ICD-10-CM

## 2022-11-26 DIAGNOSIS — I10 Essential (primary) hypertension: Secondary | ICD-10-CM

## 2022-11-26 DIAGNOSIS — F3342 Major depressive disorder, recurrent, in full remission: Secondary | ICD-10-CM

## 2022-11-26 DIAGNOSIS — J329 Chronic sinusitis, unspecified: Secondary | ICD-10-CM

## 2022-11-26 DIAGNOSIS — G4700 Insomnia, unspecified: Secondary | ICD-10-CM

## 2022-11-26 MED ORDER — AMPHETAMINE-DEXTROAMPHETAMINE 30 MG PO TABS: 30.0000 mg | ORAL_TABLET | Freq: Two times a day (BID) | ORAL | 0 refills | Status: DC

## 2022-11-26 MED ORDER — DOXYCYCLINE HYCLATE 100 MG PO TABS
100.0000 mg | ORAL_TABLET | Freq: Two times a day (BID) | ORAL | 0 refills | Status: DC
Start: 2022-11-26 — End: 2023-03-06

## 2022-11-26 NOTE — Patient Instructions (Addendum)
It was very nice to see you today!  Please start the doxycycline.   You can use cortisone cream on the rash.  We will take amlodipine off your medication list.   Please wean down on the buspar. Take 1 pill daily for the next 1-2 weeks then stop. Check in with [place] in a few weeks to let us know how you are doing.  We will refer you to ENT and also for a sleep study.  Return if symptoms worsen or fail to improve.   Take care, Dr Jimmey Ralph  PLEASE NOTE:  If you had any lab tests, please let us know if you have not heard back within a few days. You may see your results on mychart before we have a chance to review them but we will give you a call once they are reviewed by [place].   If we ordered any referrals today, please let us know if you have not heard from their office within the next week.   If you had any urgent prescriptions sent in today, please check with the pharmacy within an hour of our visit to make sure the prescription was transmitted appropriately.   Please try these tips to maintain a healthy lifestyle:  Eat at least 3 REAL meals and 1-2 snacks per day.  Aim for no more than 5 hours between eating.  If you eat breakfast, please do so within one hour of getting up.   Each meal should contain half fruits/vegetables, one quarter protein, and one quarter carbs (no bigger than a computer mouse)  Cut down on sweet beverages. This includes juice, soda, and sweet tea.   Drink at least 1 glass of water with each meal and aim for at least 8 glasses per day  Exercise at least 150 minutes every week.

## 2022-11-26 NOTE — Assessment & Plan Note (Signed)
Symptoms are very well-controlled.  She would like to wean off BuSpar.  She will go to 7.5 mg daily for 1 to 2 weeks and then discontinue.  She will continue Lexapro 10 mg twice daily.  She will follow-up with [place] in a few weeks via MyChart and we can titrate meds as needed.

## 2022-11-26 NOTE — Assessment & Plan Note (Signed)
Blood pressure is at goal today off of the amlodipine.  Will continue HCTZ 25 mg daily.  She will continue to monitor at home and let us know if persistently elevated.

## 2022-11-26 NOTE — Progress Notes (Signed)
Rebekah Campbell is a 57 y.o. female who presents today for an office visit.  Assessment/Plan:  New/Acute Problems: Sinusitis  No red flags.  Given chronicity of symptoms will start antibiotics.  Will start doxycycline.  She will continue over-the-counter meds.  Encouraged hydration.  We discussed reasons to return to care.  Will refer to ENT per patient request.  Rash Consistent with local inflammatory reaction related to her tick bite.  Rash is not consistent with Lyme disease or RMSF.  May have a small amount of underlying cellulitis.  Will be treating course of doxycycline as above which should treat cellulitis.  She can use topical cortisone cream for the rash as well.  We discussed reasons to return to care.  Chronic Problems Addressed Today: Essential hypertension Blood pressure is at goal today off of the amlodipine.  Will continue HCTZ 25 mg daily.  She will continue to monitor at home and let us know if persistently elevated.  Anxiety Symptoms are very well-controlled.  She would like to wean off BuSpar.  She will go to 7.5 mg daily for 1 to 2 weeks and then discontinue.  She will continue Lexapro 10 mg twice daily.  She will follow-up with [place] in a few weeks via MyChart and we can titrate meds as needed.  Recurrent major depressive disorder, in full remission (HCC) See anxiety A/P.  Depression is well-controlled.  Continue Lexapro 10 mg twice daily.  We are weaning off BuSpar as above for her anxiety.  Attention deficit hyperactivity disorder (ADHD), predominantly inattentive type Stable on Adderall 30 mg twice daily.  No significant side effects.  Refill was sent in earlier today.  Medications help with ability to stay focused and on task.  Insomnia She is concerned about possible sleep apnea.  She has woken up at night snoring recently.  Will refer for sleep study.     Subjective:  HPI:  See Assessment / plan for status of chronic conditions.    She was last seen seen  here a couple of months ago. At our last visit we added on amlodipine for BP control and buspar for her anxiety. The amlodipine caused leg swelling and she stopped taking this. She has been consistent with the buspar but she is concerned that it has caused weight gain. Mood has been stable and well controlled.  She would like to stop the BuSpar.  She did have a tick bite on her left back several weeks ago. This initially caused a severe rash but does seem to be improving.   She is having more drainage for the last month or so. She has notice that she has been snoring more and is sometimes waking up at night due to this. Tried antihistamines and OTC nasal sprays which did not help. No fevers or chills. A lot of ear pressure. Some discharge.  A lot of facial pressure.       Objective:  Physical Exam: BP 124/87   Pulse 85   Temp 97.6 F (36.4 C) (Temporal)   Ht 5\' 5"  (1.651 m)   Wt 132 lb 3.2 oz (60 kg)   SpO2 100%   BMI 22.00 kg/m   Wt Readings from Last 3 Encounters:  11/26/22 132 lb 3.2 oz (60 kg)  09/18/22 127 lb 3.2 oz (57.7 kg)  09/01/22 127 lb 6 oz (57.8 kg)  Gen: No acute distress, resting comfortably HEENT: TMs with clear effusion.  OP erythematous.  Nasal mucosa erythematous and boggy bilaterally with  clear discharge. CV: Regular rate and rhythm with no murmurs appreciated Pulm: Normal work of breathing, clear to auscultation bilaterally with no crackles, wheezes, or rhonchi Skin: Erythematous rash approximately 2 cm in diameter on left back Neuro: Grossly normal, moves all extremities Psych: Normal affect and thought content      Domenique Southers M. Jimmey Ralph, MD 11/26/2022 8:25 AM

## 2022-11-26 NOTE — Assessment & Plan Note (Signed)
Stable on Adderall 30 mg twice daily.  No significant side effects.  Refill was sent in earlier today.  Medications help with ability to stay focused and on task.

## 2022-11-26 NOTE — Assessment & Plan Note (Signed)
She is concerned about possible sleep apnea.  She has woken up at night snoring recently.  Will refer for sleep study.

## 2022-11-26 NOTE — Assessment & Plan Note (Signed)
See anxiety A/P.  Depression is well-controlled.  Continue Lexapro 10 mg twice daily.  We are weaning off BuSpar as above for her anxiety.

## 2022-12-14 ENCOUNTER — Other Ambulatory Visit: Payer: Self-pay | Admitting: Family Medicine

## 2022-12-16 ENCOUNTER — Ambulatory Visit: Payer: 59 | Admitting: Family Medicine

## 2023-01-02 ENCOUNTER — Other Ambulatory Visit: Payer: Self-pay | Admitting: Family Medicine

## 2023-01-02 ENCOUNTER — Telehealth: Payer: Self-pay | Admitting: Family Medicine

## 2023-01-02 NOTE — Telephone Encounter (Signed)
Prescription Request  01/02/2023  LOV: 11/26/2022  What is the name of the medication or equipment? amphetamine-dextroamphetamine (ADDERALL) 30 MG tablet   Have you contacted your pharmacy to request a refill? Yes   Which pharmacy would you like this sent to?  [id number] - [place], [place] - [address] [place] [address] Phone: [phone] Fax: [phone]   Patient notified that their request is being sent to the clinical staff for review and that they should receive a response within 2 business days.   Please advise at Mobile [phone] (mobile)

## 2023-01-06 MED ORDER — AMPHETAMINE-DEXTROAMPHETAMINE 30 MG PO TABS: 30.0000 mg | ORAL_TABLET | Freq: Two times a day (BID) | ORAL | 0 refills | Status: DC

## 2023-01-06 NOTE — Telephone Encounter (Signed)
Pt called again and needs refill on the RX and she states she usually gets refills on the RX. Please advise.

## 2023-01-07 ENCOUNTER — Telehealth: Payer: Self-pay | Admitting: Family Medicine

## 2023-01-07 NOTE — Telephone Encounter (Signed)
Her medication was sent in yesterday.  Please have her seen ASAP for her wound if she calls in.  Rebekah Campbell. Jimmey Ralph, MD 01/07/2023 10:58 AM

## 2023-01-07 NOTE — Telephone Encounter (Signed)
See note

## 2023-01-07 NOTE — Telephone Encounter (Signed)
4 attempts made to contact pt. Access nurse LVM  Patient Name First: Rebekah Last: Campbell Gender: Female DOB: 08/02/1965 Age: 57 Y 1 M 19 D Return Phone Number: [phone] (Primary), [phone] (Secondary) Address: City/ State/ Zip: [place]  [address] Client [facility] - Human resources officer [facility] [employer] Provider Jacquiline Doe- MD Contact Type Call Who Is Calling Patient / Member / Family / Caregiver Call Type Triage / Clinical Relationship To Patient Self Return Phone Number [phone] (Primary) Chief Complaint Puncture Wound / Stab Wound Reason for Call Symptomatic / Request for Health Information Initial Comment Caller states she needs a refill on her medication. Caller states her medication was supposed to be refilled 2months ago and she has been completely out for the past 3 days. Caller states without the medication she is having symptoms of anxiety, and ADD related issues without it. Caller states over the weekend she was gardening and a box cutter stabbed her left ankle and it is now swollen and looks infected, the caller states if the nurse can also assist her with that she would like to have it notated as well. Translation No Disp. Time Lamount Cohen Time) Disposition Final User 01/06/2023 6:09:24 PM Attempt made - message left Cabe, RN, Cala Bradford 01/06/2023 6:30:38 PM Attempt made - message left Cabe, RN, Cala Bradford 01/06/2023 6:59:15 PM FINAL ATTEMPT MADE - message left Yes Cabe, RN, Cala Bradford Final Disposition 01/06/2023 6:59:15 PM FINAL ATTEMPT MADE - message left Yes Cabe, RN, Cala Bradford Comments User: Ammie Ferrier, RN Date/Time Lamount Cohen Time): 01/06/2023 6:11:12 PM Person who answered the secondary number states pt is currently on the phone. This RN said she will call back in about 15 min. User: Ammie Ferrier, RN Date/Time Lamount Cohen Time): 01/06/2023 6:30:27 PM Comments 2nd attempt made to  secondary number. He states she is still on the phone on her number

## 2023-02-16 ENCOUNTER — Other Ambulatory Visit: Payer: Self-pay | Admitting: Family Medicine

## 2023-02-16 MED ORDER — AMPHETAMINE-DEXTROAMPHETAMINE 30 MG PO TABS: 30.0000 mg | ORAL_TABLET | Freq: Two times a day (BID) | ORAL | 0 refills | Status: DC

## 2023-02-16 NOTE — Telephone Encounter (Signed)
Prescription Request  02/16/2023  LOV: 11/26/2022  What is the name of the medication or equipment?  amphetamine-dextroamphetamine (ADDERALL) 30 MG tablet     Have you contacted your pharmacy to request a refill? No   Which pharmacy would you like this sent to?    [id number] - [place], [place] - [address] Phone: [phone]  Fax: [phone]      Patient notified that their request is being sent to the clinical staff for review and that they should receive a response within 2 business days.   Please advise at Mobile [phone] (mobile)

## 2023-02-17 NOTE — Telephone Encounter (Signed)
Patient states although med refill was sent in, future rx orders were suppose to be sent in. For example, one rx for July then one for August, September, and October. This way she won't run out of medication and have to call into office until 3 month period is over. Patient is requesting additional refills be lined up.

## 2023-02-20 ENCOUNTER — Encounter: Payer: Self-pay | Admitting: Family Medicine

## 2023-02-20 NOTE — Telephone Encounter (Signed)
Please call pt back with bottom issue.

## 2023-02-23 NOTE — Telephone Encounter (Signed)
Please see pt msg and pictures and advise if she should be seen or contact Sports Medicine/Ortho referral?

## 2023-02-26 ENCOUNTER — Ambulatory Visit (INDEPENDENT_AMBULATORY_CARE_PROVIDER_SITE_OTHER): Payer: 59 | Admitting: Student

## 2023-02-26 ENCOUNTER — Ambulatory Visit (HOSPITAL_BASED_OUTPATIENT_CLINIC_OR_DEPARTMENT_OTHER)
Admission: RE | Admit: 2023-02-26 | Discharge: 2023-02-26 | Disposition: A | Discharge status: Home / Self Care | Payer: 59 | Source: Ambulatory Visit | Attending: Student | Admitting: Student

## 2023-02-26 ENCOUNTER — Encounter (HOSPITAL_BASED_OUTPATIENT_CLINIC_OR_DEPARTMENT_OTHER): Payer: Self-pay | Admitting: Student

## 2023-02-26 ENCOUNTER — Ambulatory Visit (HOSPITAL_BASED_OUTPATIENT_CLINIC_OR_DEPARTMENT_OTHER): Payer: 59

## 2023-02-26 DIAGNOSIS — M79642 Pain in left hand: Secondary | ICD-10-CM | POA: Diagnosis not present

## 2023-02-26 DIAGNOSIS — S62647A Nondisplaced fracture of proximal phalanx of left little finger, initial encounter for closed fracture: Secondary | ICD-10-CM | POA: Diagnosis not present

## 2023-02-26 NOTE — Progress Notes (Signed)
Chief Complaint: Pain in left pinky finger     History of Present Illness:    Rebekah Campbell is a 57 y.o. female presenting today for pain in the pinky finger of her left hand.  Patient states that 4 days ago she fell while doing yard work and hit her hand on something.  It has since been painful, swollen, and slightly bruised.  Pain is overall mild and this is only worsened if she bumps her finger.  She has not been taking any pain medication and does not have the finger splinted or wrapped.  She was previously seen in urgent care a few days ago and she states that x-rays taken showed a possible fracture.  She does not have any numbness and tingling in the finger or hand.   Surgical History:   None of left hand  PMH/PSH/Family History/Social History/Meds/Allergies:    Past Medical History:  Diagnosis Date   ADHD (attention deficit hyperactivity disorder)    Depression    HTN (hypertension)    Inguinal hernia of right side with obstruction and without gangrene    Past Surgical History:  Procedure Laterality Date    IRRIGATION AND DEBRIDEMENT EXTR IRRIGATION AND DEBRIDEMENT EXTREMITY (Left )EMITY (Left )  10/01/2020   HERNIA REPAIR     I & D EXTREMITY Left 10/01/2020   Procedure: IRRIGATION AND DEBRIDEMENT EXTREMITY;  Surgeon: Roby Lofts, MD;  Location: [facility];  Service: Orthopedics;  Laterality: Left;   OPEN REDUCTION INTERNAL FIXATION (ORIF) DISTAL HUMERUS FRACTURE (Left )  10/01/2020   ORIF HUMERUS FRACTURE Left 10/01/2020   Procedure: OPEN REDUCTION INTERNAL FIXATION (ORIF) DISTAL HUMERUS FRACTURE;  Surgeon: Roby Lofts, MD;  Location: [facility];  Service: Orthopedics;  Laterality: Left;   ORIF HUMERUS FRACTURE Left 12/12/2020   Procedure: OPEN REDUCTION INTERNAL FIXATION (ORIF) HUMERAL SHAFT FRACTURE;  Surgeon: Roby Lofts, MD;  Location: [facility];  Service: Orthopedics;  Laterality: Left;   TONSILLECTOMY     Social History    Socioeconomic History   Marital status: Single    Spouse name: Not on file   Number of children: Not on file   Years of education: Not on file   Highest education level: Not on file  Occupational History   Not on file  Tobacco Use   Smoking status: Never   Smokeless tobacco: Never  Substance and Sexual Activity   Alcohol use: Yes    Alcohol/week: 3.0 - 5.0 standard drinks of alcohol    Types: 3 - 5 Glasses of wine per week   Drug use: No   Sexual activity: Yes  Other Topics Concern   Not on file  Social History Narrative   Not on file   Social Determinants of Health   Financial Resource Strain: Not on file  Food Insecurity: Not on file  Transportation Needs: Not on file  Physical Activity: Not on file  Stress: Not on file  Social Connections: Not on file   Family History  Problem Relation Age of Onset   Anesthesia problems Neg Hx    Allergies  Allergen Reactions   Codeine Nausea And Vomiting   Current Outpatient Medications  Medication Sig Dispense Refill   amphetamine-dextroamphetamine (ADDERALL) 30 MG tablet Take 1 tablet by mouth 2 (two) times daily. 60 tablet 0  busPIRone (BUSPAR) 7.5 MG tablet TAKE 1 TABLET BY MOUTH TWICE A DAY 60 tablet 5   doxycycline (VIBRA-TABS) 100 MG tablet Take 1 tablet (100 mg total) by mouth 2 (two) times daily. 14 tablet 0   escitalopram (LEXAPRO) 10 MG tablet Take 10 mg by mouth daily.     Ginkgo Biloba Extract 60 MG CAPS Take 60 mg by mouth daily.     Glucosamine-Chondroit-Vit C-Mn (GLUCOSAMINE 1500 COMPLEX PO) Take 2 tablets by mouth daily.     hydrochlorothiazide (HYDRODIURIL) 25 MG tablet TAKE 1 TABLET(25 MG) BY MOUTH DAILY 90 tablet 3   Milk Thistle 140 MG CAPS Take 140 mg by mouth daily.     Omega-3 Fatty Acids (FISH OIL) 1000 MG CAPS Take 1,000 mg by mouth daily.     omeprazole (PRILOSEC) 40 MG capsule TAKE 1 CAPSULE BY MOUTH EVERY DAY 90 capsule 1   vitamin E 1000 UNIT capsule Take 1,000 Units by mouth daily.     No  current facility-administered medications for this visit.   Facility-Administered Medications Ordered in Other Visits  Medication Dose Route Frequency Provider Last Rate Last Admin   fentaNYL (SUBLIMAZE) injection    PRN Jeani Hawking, CRNA   50 mcg at 10/18/11 0926   lidocaine (cardiac) 100 mg/4ml (XYLOCAINE) 20 MG/ML injection 2%    PRN Jeani Hawking, CRNA   80 mg at 10/18/11 0920   propofol (DIPRIVAN) 10 mg/mL bolus    PRN Jeani Hawking, CRNA   200 mg at 10/18/11 0920   No results found.  Review of Systems:   A ROS was performed including pertinent positives and negatives as documented in the HPI.  Physical Exam :   Constitutional: NAD and appears stated age Neurological: Alert and oriented Psych: Appropriate affect and cooperative There were no vitals taken for this visit.   Comprehensive Musculoskeletal Exam:    Left fifth finger is swollen with bruising noted over the proximal phalanx.  No tenderness of the fifth MCP joint or metacarpal.  She is limited to about 50% of flexion while trying to form a fist, which is not painful.  Radial pulse 2+.  Distal fifth finger is warm and well-perfused.  Neurosensory exam intact.  Imaging:   Xray (left hand 3 views): Oblique nondisplaced fracture of the fifth proximal phalanx    I personally reviewed and interpreted the radiographs.   Assessment:   57 y.o. female with a fifth proximal phalanx fracture of the left hand.  This appears nondisplaced.  There is some swelling that is likely limiting her motion, however she is able to move it well with only mild pain.  After discussion of treatment options, I have recommended some form of immobilization either with a ulnar gutter removable splint or consistent buddy taping.  She would like to proceed with taping as this will not restrict her range of motion of the wrist.  Will plan to see her in 4 weeks for another x-ray to ensure proper healing.  Plan :    -Return to clinic in 4  weeks for reassessment     I personally saw and evaluated the patient, and participated in the management and treatment plan.  Hazle Nordmann, PA-C Orthopedics

## 2023-03-03 ENCOUNTER — Telehealth: Payer: Self-pay

## 2023-03-03 NOTE — Telephone Encounter (Signed)
Elnita Maxwell with [facility] wanted to make [facility] aware of results for left hand.  Please advise.  Thank you.

## 2023-03-05 ENCOUNTER — Ambulatory Visit: Payer: 59 | Admitting: Family Medicine

## 2023-03-06 ENCOUNTER — Telehealth (INDEPENDENT_AMBULATORY_CARE_PROVIDER_SITE_OTHER): Payer: 59 | Admitting: Family

## 2023-03-06 ENCOUNTER — Other Ambulatory Visit: Payer: Self-pay | Admitting: *Deleted

## 2023-03-06 ENCOUNTER — Other Ambulatory Visit: Payer: Self-pay | Admitting: Family Medicine

## 2023-03-06 DIAGNOSIS — L237 Allergic contact dermatitis due to plants, except food: Secondary | ICD-10-CM | POA: Insufficient documentation

## 2023-03-06 DIAGNOSIS — R197 Diarrhea, unspecified: Secondary | ICD-10-CM | POA: Diagnosis not present

## 2023-03-06 DIAGNOSIS — S62647A Nondisplaced fracture of proximal phalanx of left little finger, initial encounter for closed fracture: Secondary | ICD-10-CM | POA: Insufficient documentation

## 2023-03-06 DIAGNOSIS — S62647D Nondisplaced fracture of proximal phalanx of left little finger, subsequent encounter for fracture with routine healing: Secondary | ICD-10-CM | POA: Diagnosis not present

## 2023-03-06 MED ORDER — HYDROXYZINE PAMOATE 25 MG PO CAPS: 25.0000 mg | ORAL_CAPSULE | Freq: Three times a day (TID) | ORAL | 0 refills | Status: DC | PRN

## 2023-03-06 MED ORDER — PREDNISONE 10 MG PO TABS
ORAL_TABLET | ORAL | 0 refills | Status: DC
Start: 2023-03-06 — End: 2023-04-13

## 2023-03-06 NOTE — Assessment & Plan Note (Signed)
  Concurrent diarrhea for the past 5-6 days.  I am not convinced that this is related to her contact dermatitis though. -Advise over-the-counter Imodium as needed. -Encourage hydration. -If diarrhea worsens or does not improve in 2-3 days, patient to notify the clinic.

## 2023-03-06 NOTE — Assessment & Plan Note (Signed)
Clinically improving.  

## 2023-03-06 NOTE — Assessment & Plan Note (Signed)
New. Widespread rash with intense pruritus, likely secondary to exposure during gardening. No known history of allergies to poison oak, sumac or ivy. Rash is not responding to over-the-counter treatments. -Start Prednisone taper. -Prescribe Hydroxyzine for pruritus q8 hours prn. -Recommend Aveeno oatmeal baths for additional symptomatic relief.

## 2023-03-06 NOTE — Patient Instructions (Signed)
VISIT SUMMARY:  Dear Rebekah Campbell, during your visit, we discussed your severe skin rash and diarrhea. The rash is likely due to plant exposure during gardening, and the diarrhea could be related to this or dietary factors. We also noted your recent finger fracture, which is still healing.  YOUR PLAN:  -SKIN RASH: Your skin rash is likely due to contact with poison sumac, a plant that can cause a severe allergic reaction in some people. We will start you on Prednisone to help reduce the inflammation and Hydroxyzine to help with the itching. You can also take Aveeno oatmeal baths for additional relief.  -DIARRHEA: Your diarrhea could be related to the rash or your diet. We recommend over-the-counter Imodium as needed and encourage you to stay hydrated. If the diarrhea worsens or does not improve in 2-3 days, please notify the clinic.  -FINGER INJURY: Your recent finger sprain is still healing and does not require additional intervention at this time.  INSTRUCTIONS:  Please start taking the prescribed medications as directed. Remember to stay hydrated and monitor your symptoms closely. If your diarrhea worsens or does not improve in 2-3 days, or if your rash does not improve with the medication, please notify the clinic immediately.

## 2023-03-06 NOTE — Progress Notes (Signed)
Subjective:     Patient ID: Rebekah Campbell, female    DOB: [birth year], 57 y.o.   MRN: [id number]  Chief Complaint  Patient presents with   Diarrhea    Complains of diarrhea that started 5 days ago   Rash    Complains of skin rash that also started 5 days ago    Diarrhea   Rash Associated symptoms include diarrhea.    Discussed the use of AI scribe software for clinical note transcription with the patient, who gave verbal consent to proceed.  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 The rash started suddenly in the middle of the night after a day of gardening. It began with intense itching and scratching, and by the next day, she had developed numerous bumps all over her body. The bumps are "oozing and some are bleeding." She had a similar but less severe reaction earlier in the year, which she attributed to poison oak or ivy, but this current rash is more severe. She has never been allergic to poison oak or ivy before. After researching her symptoms, she believes she may have been exposed to sumac.  In addition to the rash, Rebekah Campbell has been experiencing diarrhea for the past five to six days. She initially attributed this to her diet, which includes a lot of fruit, particularly watermelon. However, after reading about sumac, she learned that it could cause gastrointestinal issues, which made her think the diarrhea could be related to the rash. She has been having five to six loose stools a day.    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 The rash started suddenly in the middle of the night after a day of gardening. It began with intense itching and scratching, and by the next day, she had developed numerous bumps all over her body. The bumps are "oozing and some are bleeding." She had a similar but less severe reaction earlier in the year, which she attributed to poison oak or ivy, but this current rash is more  severe. She has never been allergic to poison oak or ivy before. After researching her symptoms, she believes she may have been exposed to sumac.  In addition to the rash, Rebekah Campbell has been experiencing diarrhea for the past five to six days. She initially attributed this to her diet, which includes a lot of fruit, particularly watermelon. However, after reading about sumac, she learned that it could cause gastrointestinal issues, which made her think the diarrhea could be related to the rash. She has been having five to six loose stools a day.  Finger fracture- was seen in urgent care on 8/29. Reports swelling and pain are improving.   Health Maintenance Due  Topic Date Due   MAMMOGRAM  05/08/2022   INFLUENZA VACCINE  Never done   COVID-19 Vaccine (3 - 2023-24 season) 03/01/2023   PAP SMEAR-Modifier  01/12/2023    Past Medical History:  Diagnosis Date   ADHD (attention deficit hyperactivity disorder)    Depression    HTN (hypertension)    Inguinal hernia of right side with obstruction and without gangrene     Past Surgical History:  Procedure Laterality Date    IRRIGATION AND DEBRIDEMENT EXTR IRRIGATION AND DEBRIDEMENT EXTREMITY (Left )EMITY (Left )  10/01/2020   HERNIA REPAIR     I & D EXTREMITY Left 10/01/2020   Procedure: IRRIGATION AND DEBRIDEMENT EXTREMITY;  Surgeon: Roby Lofts, MD;  Location: [facility];  Service: Orthopedics;  Laterality: Left;   OPEN REDUCTION INTERNAL FIXATION (ORIF) DISTAL HUMERUS FRACTURE (Left )  10/01/2020   ORIF HUMERUS FRACTURE Left 10/01/2020   Procedure: OPEN REDUCTION INTERNAL FIXATION (ORIF) DISTAL HUMERUS FRACTURE;  Surgeon: Roby Lofts, MD;  Location: [facility];  Service: Orthopedics;  Laterality: Left;   ORIF HUMERUS FRACTURE Left 12/12/2020   Procedure: OPEN REDUCTION INTERNAL FIXATION (ORIF) HUMERAL SHAFT FRACTURE;  Surgeon: Roby Lofts, MD;  Location: [facility];  Service: Orthopedics;  Laterality: Left;   TONSILLECTOMY      Family History   Problem Relation Age of Onset   Anesthesia problems Neg Hx     Social History   Socioeconomic History   Marital status: Single    Spouse name: Not on file   Number of children: Not on file   Years of education: Not on file   Highest education level: Not on file  Occupational History   Not on file  Tobacco Use   Smoking status: Never   Smokeless tobacco: Never  Substance and Sexual Activity   Alcohol use: Yes    Alcohol/week: 3.0 - 5.0 standard drinks of alcohol    Types: 3 - 5 Glasses of wine per week   Drug use: No   Sexual activity: Yes  Other Topics Concern   Not on file  Social History Narrative   Not on file   Social Determinants of Health   Financial Resource Strain: Not on file  Food Insecurity: Not on file  Transportation Needs: Not on file  Physical Activity: Not on file  Stress: Not on file  Social Connections: Not on file  Intimate Partner Violence: Not on file    Outpatient Medications Prior to Visit  Medication Sig Dispense Refill   amphetamine-dextroamphetamine (ADDERALL) 30 MG tablet Take 1 tablet by mouth 2 (two) times daily. 60 tablet 0   busPIRone (BUSPAR) 7.5 MG tablet TAKE 1 TABLET BY MOUTH TWICE A DAY 60 tablet 5   escitalopram (LEXAPRO) 10 MG tablet Take 10 mg by mouth daily.     Ginkgo Biloba Extract 60 MG CAPS Take 60 mg by mouth daily.     Glucosamine-Chondroit-Vit C-Mn (GLUCOSAMINE 1500 COMPLEX PO) Take 2 tablets by mouth daily.     hydrochlorothiazide (HYDRODIURIL) 25 MG tablet TAKE 1 TABLET BY MOUTH EVERY DAY 90 tablet 1   Milk Thistle 140 MG CAPS Take 140 mg by mouth daily.     Omega-3 Fatty Acids (FISH OIL) 1000 MG CAPS Take 1,000 mg by mouth daily.     omeprazole (PRILOSEC) 40 MG capsule TAKE 1 CAPSULE BY MOUTH EVERY DAY 90 capsule 1   vitamin E 1000 UNIT capsule Take 1,000 Units by mouth daily.     doxycycline (VIBRA-TABS) 100 MG tablet Take 1 tablet (100 mg total) by mouth 2 (two) times daily. 14 tablet 0   Facility-Administered  Medications Prior to Visit  Medication Dose Route Frequency Provider Last Rate Last Admin   fentaNYL (SUBLIMAZE) injection    PRN Jeani Hawking, CRNA   50 mcg at 10/18/11 0926   lidocaine (cardiac) 100 mg/98ml (XYLOCAINE) 20 MG/ML injection 2%    PRN Jeani Hawking, CRNA   80 mg at 10/18/11 0920   propofol (DIPRIVAN) 10 mg/mL bolus    PRN Jeani Hawking, CRNA   200 mg at 10/18/11 0920    Allergies  Allergen Reactions   Codeine Nausea And Vomiting    Review of Systems  Gastrointestinal:  Positive for diarrhea.  Skin:  Positive for rash.   See HPI    Objective:    Physical Exam   There were no vitals taken for this visit. Wt Readings from Last 3 Encounters:  11/26/22 132 lb 3.2 oz (60 kg)  09/18/22 127 lb 3.2 oz (57.7 kg)  09/01/22 127 lb 6 oz (57.8 kg)    Gen: Awake, alert, no acute distress Resp: Breathing is even and non-labored Psych: calm/pleasant demeanor Neuro: Alert and Oriented x 3, + facial symmetry, speech is clear. Skin: diffuse maculopapular rash noted on arms, legs, some on face MS: some swelling noted at base of left 5th finger    Assessment & Plan:   Problem List Items Addressed This Visit       Unprioritized   Diarrhea     Concurrent diarrhea for the past 5-6 days.  I am not convinced that this is related to her contact dermatitis though. -Advise over-the-counter Imodium as needed. -Encourage hydration. -If diarrhea worsens or does not improve in 2-3 days, patient to notify the clinic.       Closed nondisplaced fracture of proximal phalanx of left little finger    Clinically improving.       Allergic contact dermatitis due to plants, except food - Primary    New. Widespread rash with intense pruritus, likely secondary to exposure during gardening. No known history of allergies to poison oak, sumac or ivy. Rash is not responding to over-the-counter treatments. -Start Prednisone taper. -Prescribe Hydroxyzine for pruritus q8 hours  prn. -Recommend Aveeno oatmeal baths for additional symptomatic relief.       I have discontinued Chessie P. Sandt's doxycycline. I am also having her start on predniSONE and hydrOXYzine. Additionally, I am having her maintain her Fish Oil, Glucosamine-Chondroit-Vit C-Mn (GLUCOSAMINE 1500 COMPLEX PO), Ginkgo Biloba Extract, Milk Thistle, vitamin E, escitalopram, busPIRone, omeprazole, amphetamine-dextroamphetamine, and hydrochlorothiazide.  Meds ordered this encounter  Medications   predniSONE (DELTASONE) 10 MG tablet    Sig: 4 tabs by mouth once daily for 2 days, then 3 tabs daily x 2 days, then 2 tabs daily x 2 days, then 1 tab daily x 2 days    Dispense:  20 tablet    Refill:  0    Order Specific Question:   Supervising Provider    Answer:   Danise Edge A [[id number]]   hydrOXYzine (VISTARIL) 25 MG capsule    Sig: Take 1 capsule (25 mg total) by mouth every 8 (eight) hours as needed.    Dispense:  30 capsule    Refill:  0    Order Specific Question:   Supervising Provider    Answer:   Danise Edge A [[id number]]

## 2023-03-13 ENCOUNTER — Ambulatory Visit: Payer: 59 | Admitting: Family Medicine

## 2023-04-06 ENCOUNTER — Encounter (HOSPITAL_BASED_OUTPATIENT_CLINIC_OR_DEPARTMENT_OTHER): Payer: Self-pay

## 2023-04-06 ENCOUNTER — Telehealth: Payer: Self-pay | Admitting: Family Medicine

## 2023-04-06 MED ORDER — OMEPRAZOLE 40 MG PO CPDR: 40.0000 mg | DELAYED_RELEASE_CAPSULE | Freq: Every day | ORAL | 3 refills | Status: DC

## 2023-04-06 MED ORDER — AMPHETAMINE-DEXTROAMPHETAMINE 30 MG PO TABS: 30.0000 mg | ORAL_TABLET | Freq: Two times a day (BID) | ORAL | 0 refills | Status: DC

## 2023-04-06 NOTE — Telephone Encounter (Signed)
Prescription Request  04/06/2023  LOV: 11/26/2022  What is the name of the medication or equipment?  amphetamine-dextroamphetamine (ADDERALL) 30 MG tablet  WITH REFILLS for 3 Months  omeprazole (PRILOSEC) 40 MG capsule    Also, Patient requests Amlodipine be taken off her medication (does not take anymore)  Have you contacted your pharmacy to request a refill? Yes   Which pharmacy would you like this sent to?  [id number] - [place], [place] - [address] [place] [address] Phone: [phone] Fax: [phone]   Patient notified that their request is being sent to the clinical staff for review and that they should receive a response within 2 business days.   Please advise at Mobile [phone] (mobile)

## 2023-04-09 ENCOUNTER — Ambulatory Visit: Payer: 59 | Admitting: Family Medicine

## 2023-04-13 ENCOUNTER — Ambulatory Visit: Payer: 59 | Admitting: Family Medicine

## 2023-04-13 ENCOUNTER — Encounter: Payer: Self-pay | Admitting: Family Medicine

## 2023-04-13 VITALS — BP 112/75 | HR 101 | Temp 97.8°F | Ht 65.0 in | Wt 126.6 lb

## 2023-04-13 DIAGNOSIS — F900 Attention-deficit hyperactivity disorder, predominantly inattentive type: Secondary | ICD-10-CM | POA: Diagnosis not present

## 2023-04-13 DIAGNOSIS — F419 Anxiety disorder, unspecified: Secondary | ICD-10-CM | POA: Diagnosis not present

## 2023-04-13 DIAGNOSIS — I10 Essential (primary) hypertension: Secondary | ICD-10-CM | POA: Diagnosis not present

## 2023-04-13 DIAGNOSIS — E2839 Other primary ovarian failure: Secondary | ICD-10-CM

## 2023-04-13 MED ORDER — AMPHETAMINE-DEXTROAMPHETAMINE 30 MG PO TABS: 30.0000 mg | ORAL_TABLET | Freq: Two times a day (BID) | ORAL | 0 refills | Status: DC

## 2023-04-13 NOTE — Assessment & Plan Note (Addendum)
Stable on Adderall 30 mg twice daily. Medications helped with her ability to stay focus on task. No significant side effects. Does not need refill today that we will send in a couple of prescriptions for her to have on file.

## 2023-04-13 NOTE — Assessment & Plan Note (Addendum)
Symptoms are well controlled. She does not think the buspar is helpful. She we will wean off - we had instructed her to do this a few months ago however she was not able to do this.  She will go to 7.5 mg daily for 1 to 2 weeks and then 3.25 mg daily for a couple of weeks and then stop.  She will follow up with [place] in a few weeks via MyChart. No SI or HI.

## 2023-04-13 NOTE — Progress Notes (Signed)
   Rebekah Campbell is a 57 y.o. female who presents today for an office visit.  Assessment/Plan:  New/Acute Problems: Concern for Osteoporosis  Discussed recent lifeline screening. Will order DEXA for definitive diagnosis. She is taking calcium and vitamin D deficiency. 3g  Chronic Problems Addressed Today: Attention deficit hyperactivity disorder (ADHD), predominantly inattentive type Stable on Adderall 30 mg twice daily. Medications helped with her ability to stay focus on task. No significant side effects. Does not need refill today that we will send in a couple of prescriptions for her to have on file.  Essential hypertension Blood pressure at goal today on hydrochlorothiazide 25 mg daily.   Anxiety Symptoms are well controlled. She does not think the buspar is helpful. She we will wean off - we had instructed her to do this a few months ago however she was not able to do this.  She will go to 7.5 mg daily for 1 to 2 weeks and then 3.25 mg daily for a couple of weeks and then stop.  She will follow up with [place] in a few weeks via MyChart. No SI or HI.   Healthcare Maintenance - Will order DEXA scan based on recent lifeline screening.     Subjective:  HPI:  See A/P for status of chronic conditions.  Patient is here today for follow-up.  We last saw her about 5 months ago.  Since her last visit she suffered fracture to her left fifth digit.  She saw orthopedics for this.  Still having some pain to the area with following orthopedic soon for this.  Since our last visit she did also have lifeline screening performed that showed that she was "at risk" for osteoporosis. She would like to have further testing for this. She also did break a couple of ribs within the last few months while gardening.   Her husband was also recently admitted to the hospital with concern for GI bleed.      Objective:  Physical Exam: BP 112/75   Pulse (!) 101   Temp 97.8 F (36.6 C) (Temporal)   Ht 5\' 5"   (1.651 m)   Wt 126 lb 9.6 oz (57.4 kg)   SpO2 99%   BMI 21.07 kg/m   Gen: No acute distress, resting comfortably Neuro: Grossly normal, moves all extremities Psych: Normal affect and thought content      Rebekah Campbell M. Jimmey Ralph, MD 04/13/2023 11:42 AM

## 2023-04-13 NOTE — Patient Instructions (Addendum)
It was very nice to see you today!  We will order your bone density scan.  I will send medications into the pharmacy.  Please come back soon for your annual physical with pap.   Return for Annual Physical.   Take care, Dr Jimmey Ralph  PLEASE NOTE:  If you had any lab tests, please let us know if you have not heard back within a few days. You may see your results on mychart before we have a chance to review them but we will give you a call once they are reviewed by [place].   If we ordered any referrals today, please let us know if you have not heard from their office within the next week.   If you had any urgent prescriptions sent in today, please check with the pharmacy within an hour of our visit to make sure the prescription was transmitted appropriately.   Please try these tips to maintain a healthy lifestyle:  Eat at least 3 REAL meals and 1-2 snacks per day.  Aim for no more than 5 hours between eating.  If you eat breakfast, please do so within one hour of getting up.   Each meal should contain half fruits/vegetables, one quarter protein, and one quarter carbs (no bigger than a computer mouse)  Cut down on sweet beverages. This includes juice, soda, and sweet tea.   Drink at least 1 glass of water with each meal and aim for at least 8 glasses per day  Exercise at least 150 minutes every week.

## 2023-04-13 NOTE — Assessment & Plan Note (Signed)
Blood pressure at goal today on hydrochlorothiazide 25 mg daily.

## 2023-06-22 ENCOUNTER — Ambulatory Visit: Payer: Self-pay | Admitting: Family Medicine

## 2023-06-22 NOTE — Telephone Encounter (Signed)
Pt called again about the below message and wanted to talk to [facility], was put on hold and then pt hung up.

## 2023-06-22 NOTE — Telephone Encounter (Signed)
Please see message. °

## 2023-06-22 NOTE — Telephone Encounter (Signed)
Copied from CRM [id number]. Topic: Clinical - Medication Question >> Jun 22, 2023  1:35 PM Tiffany H wrote: Reason for CRM: Edited to Add: Patient advised that she had Xrays done with Dr. Lucretia Field. Patient advised that Dr. Lucretia Field advised that she have the MRI. He refused to do adjustment due to spinal issues.   Phone: [phone] [facility] [place].

## 2023-06-22 NOTE — Telephone Encounter (Signed)
Left message to return call to our office at their convenience.  

## 2023-06-22 NOTE — Telephone Encounter (Signed)
FYI, see Triage note. Pt scheduled.

## 2023-06-22 NOTE — Telephone Encounter (Signed)
Copied from CRM [id number]. Topic: Clinical - Red Word Triage >> Jun 22, 2023 11:22 AM Kathryne Eriksson wrote: pinch nerves in her hands, claiming to need an MRI.Marland Kitchen  Chief Complaint:  R/O Pinched nerve according to patient C3, C4,  and C5.Patient is assistant on having an MRI. Patient reports numbness ,tingling and weakness in both hands Patient also states she has pain and numbness in her right leg.   Symptoms: Tingling an burning nerves in hand it is C3 and C4  neck Frequency: 2weeks  Disposition: [] ED /[] Urgent Care (no appt availability in office) / [] Appointment(In office/virtual)/ []  [place] [facility]/ [] Home Care/ [] Refused Recommended Disposition /[] [place] [facility]/ [x]  Follow-up with PCP Additional Notes:   Patient was seeing a Chiropractor  and he can no longer see her. Then she says her provided provider is out of  network according to [facility].  Reason for Disposition  Weakness of an arm or hand  [1] Tingling (e.g., pins and needles) of the face, arm / hand, or leg / foot on one side of the body AND [2] present now (Exceptions: Chronic or recurrent symptom lasting > 4 weeks; or tingling from known cause, such as: bumped elbow, carpal tunnel syndrome, pinched nerve, frostbite.)  [1] Numbness (i.e., loss of sensation) of the face, arm / hand, or leg / foot on one side of the body AND [2] gradual onset (e.g., days to weeks) AND [3] present now  Answer Assessment - Initial Assessment Questions 1. SYMPTOM: "What is the main symptom you are concerned about?" (e.g., weakness, numbness)     Two weeks ago the numbness and tingling is in her  bilateral hands and down her Right leg.  3. LAST NORMAL: "When was the last time you (the patient) were normal (no symptoms)?"      Two weeks ago - She was being seen in chiropractor who decided he was no longer able to provide the care she needed.  6. NEUROLOGIC SYMPTOMS: "Have you had any of the following symptoms: headache, dizziness,  vision loss, double vision, changes in speech, unsteady on your feet?"      Unsteady on her feet due to numbness  Protocols used: Neck Pain or Stiffness-A-AH, Neurologic Deficit-A-AH

## 2023-06-22 NOTE — Telephone Encounter (Signed)
  Chief Complaint: neck pain  Disposition: [] ED /[] Urgent Care (no appt availability in office) / [] Appointment(In office/virtual)/ []  [place] [facility]/ [] Home Care/ [] Refused Recommended Disposition /[] [place] [facility]/ [x]  Follow-up with PCP Additional Notes: This RN received warm tx from agent for triage for neck pain. Pt refused to answer questions. Appears pt was triaged at 1126 today, and the clinic staff have attempted to contact pt. Pt warm tx to [place] in the clinic for assistance.   Copied from CRM [id number]. Topic: Clinical - Red Word Triage >> Jun 22, 2023  3:37 PM Melissa C wrote: Red Word that prompted transfer to Nurse Triage: patient in severe neck pain Answer Assessment - Initial Assessment Questions 1. REASON FOR CALL or QUESTION: "What is your reason for calling today?" or "How can I best help you?" or "What question do you have that I can help answer?"     This RN received warm transfer from agent for neck pain. Pt refused to answer any questions to this RN. States she has been on the phone all day with the office and nobody is helping her. Appears the pt has been triaged today and clinic staff have tried to contact pt back.This RN connected with Harriett Sine, and a warm tx was provided for the patient.  Protocols used: Information Only Call - No Triage-A-AH

## 2023-06-22 NOTE — Telephone Encounter (Signed)
Please see msg

## 2023-06-23 ENCOUNTER — Other Ambulatory Visit: Payer: Self-pay | Admitting: Family Medicine

## 2023-06-23 ENCOUNTER — Ambulatory Visit: Payer: Self-pay | Admitting: Family Medicine

## 2023-06-23 MED ORDER — AMPHETAMINE-DEXTROAMPHETAMINE 30 MG PO TABS: 30.0000 mg | ORAL_TABLET | Freq: Two times a day (BID) | ORAL | 0 refills | Status: DC

## 2023-06-23 NOTE — Telephone Encounter (Signed)
Spoke with patient advise to go to ED due to Symptoms  Not feeling her hands and unable to move  Patient refused to go to ED stated people there a sick and do not want to bring any sickness home [facility]ise to [facility]  Want to get a referral for MRI  Requesting refill Rx adderall  Since her insurance will dropped off Dr Jimmey Ralph as her PCP

## 2023-06-23 NOTE — Telephone Encounter (Signed)
  Chief Complaint: wants MRI ordered Symptoms: pain Frequency: constant  Disposition: [x] ED /[] Urgent Care (no appt availability in office) / [] Appointment(In office/virtual)/ []  [place] [facility]/ [] Home Care/ [x] Refused Recommended Disposition /[] [place] [facility]/ []  Follow-up with PCP Additional Notes: Pt very upset over care. Pt waiting for MRI to be ordered. Pt in intense pain of arms, legs, and numbness all over. Pt advised to go to ER but refused. Pt stated, "I can't pay $1000 for a ER trip. I want this MRI ordered. I have insurance but can't use it, it seems." Pt explained she was on phone for 8 hours yesterday wanting answers. E2C2 nurse tried to calm the pt down, but very difficult to deal with. RN called [place] line with pt on hold. Call was able to be transferred and Francena Hanly RN able to talk with pt.          Reason for Disposition  Caller has already spoken with another triager or PCP AND has further questions AND triager able to answer questions.  Weakness of an arm or hand  Answer Assessment - Initial Assessment Questions 1. REASON FOR CALL or QUESTION: "What is your reason for calling today?" or "How can I best help you?" or "What question do you have that I can help answer?"     Pt very upset over care. Pt wants MRI ordered to figure out why she's in such pain. Pt states she has a pinched nerve on her C3-4 and chiropractor said he/she wouldn't touch her until she had one.  Answer Assessment - Initial Assessment Questions 1. ONSET: "When did the pain begin?"      *No Answer* 2. LOCATION: "Where does it hurt?"      neck 3. PATTERN "Does the pain come and go, or has it been constant since it started?"      constant 4. SEVERITY: "How bad is the pain?"  (Scale 1-10; or mild, moderate, severe)   - NO PAIN (0): no pain or only slight stiffness    - MILD (1-3): doesn't interfere with normal activities    - MODERATE (4-7): interferes with normal activities or awakens  from sleep    - SEVERE (8-10):  excruciating pain, unable to do any normal activities      10 5. RADIATION: "Does the pain go anywhere else, shoot into your arms?"     Can't feel hands, legs,  6. CORD SYMPTOMS: "Any weakness or numbness of the arms or legs?"     yes 7. CAUSE: "What do you think is causing the neck pain?"     Pinched nerve 8. NECK OVERUSE: "Any recent activities that involved turning or twisting the neck?"     no 9. OTHER SYMPTOMS: "Do you have any other symptoms?" (e.g., headache, fever, chest pain, difficulty breathing, neck swelling)     na  Protocols used: Information Only Call - No Triage-A-AH, No Contact or Duplicate Contact Call-A-AH, Neck Pain or Stiffness-A-AH

## 2023-06-23 NOTE — Telephone Encounter (Signed)
Insurance typically will not let us order an MRI. Recommend referral to orthopedics or sports medicine.  Katina Degree. Jimmey Ralph, MD 06/23/2023 11:50 AM

## 2023-06-23 NOTE — Telephone Encounter (Signed)
Please see previous phone note.   Agree she should go to the Emergency Department if she is having acutely worsening symptoms however recommend refer to sports medicine or orthopedics for advanced imaging  [facility] M. Jimmey Ralph, MD 06/23/2023 11:51 AM

## 2023-06-23 NOTE — Telephone Encounter (Signed)
Left message to return call to our office at their convenience.  

## 2023-06-23 NOTE — Telephone Encounter (Signed)
See previews note 

## 2023-06-23 NOTE — Telephone Encounter (Signed)
Heather called in with patient on backline. I was able to have patient warm transferred over to [place], PCP's CMA for more information on next steps.

## 2023-06-25 ENCOUNTER — Other Ambulatory Visit: Payer: Self-pay | Admitting: *Deleted

## 2023-06-25 ENCOUNTER — Ambulatory Visit (HOSPITAL_BASED_OUTPATIENT_CLINIC_OR_DEPARTMENT_OTHER): Payer: 59 | Admitting: Student

## 2023-06-25 ENCOUNTER — Ambulatory Visit (HOSPITAL_BASED_OUTPATIENT_CLINIC_OR_DEPARTMENT_OTHER): Payer: 59

## 2023-06-25 ENCOUNTER — Encounter (HOSPITAL_BASED_OUTPATIENT_CLINIC_OR_DEPARTMENT_OTHER): Payer: Self-pay | Admitting: Student

## 2023-06-25 ENCOUNTER — Telehealth: Payer: Self-pay | Admitting: Family Medicine

## 2023-06-25 DIAGNOSIS — M79643 Pain in unspecified hand: Secondary | ICD-10-CM

## 2023-06-25 DIAGNOSIS — M542 Cervicalgia: Secondary | ICD-10-CM

## 2023-06-25 DIAGNOSIS — M5442 Lumbago with sciatica, left side: Secondary | ICD-10-CM | POA: Diagnosis not present

## 2023-06-25 DIAGNOSIS — M5441 Lumbago with sciatica, right side: Secondary | ICD-10-CM | POA: Diagnosis not present

## 2023-06-25 MED ORDER — METHYLPREDNISOLONE 4 MG PO TBPK: ORAL_TABLET | ORAL | 0 refills | Status: DC

## 2023-06-25 NOTE — Telephone Encounter (Signed)
Copied from CRM [id number]. Topic: Referral - Request for Referral >> Jun 25, 2023  2:44 PM Kathryne Eriksson wrote: Did the patient discuss referral with their provider in the last year? Yes (If No - schedule appointment) (If Yes - send message)  Appointment offered? No  Type of order/referral and detailed reason for visit: Pinched Nerve, Needing MRI   Preference of office, provider, location: [facility] - [address] [phone] , [facility] - [address] [phone]  If referral order, have you been seen by this specialty before? No (If Yes, this issue or another issue? When? Where?  Can we respond through MyChart? Yes

## 2023-06-25 NOTE — Telephone Encounter (Signed)
See previews note 

## 2023-06-25 NOTE — Telephone Encounter (Signed)
Spoke with patient agreed with orthopedic referral  Referral placed

## 2023-06-25 NOTE — Telephone Encounter (Signed)
Left message to return call to our office at their convenience.  

## 2023-06-25 NOTE — Progress Notes (Signed)
Chief Complaint: Neck pain and tingling in both hands and legs     History of Present Illness:    Rebekah Campbell is a 57 y.o. female presenting today for evaluation of acute pain in her neck with numbness and tingling in both hands and arms.  This developed about 5 days ago without any known injury.  She does routinely see a chiropractor and states that at her last visit they took x-rays and said they would not continue treatment until her neck was worked up further.  She rates her pain today at 10/10 and has been unable to get any relief.  She is not taking any pain medications.  Also reports tingling going down both legs to her feet that is more of a chronic issue.  She has been sitting for long periods of time due to her pain over the past few days and this has made her back issues worse.   Surgical History:   Left humerus ORIF - 2022  PMH/PSH/Family History/Social History/Meds/Allergies:    Past Medical History:  Diagnosis Date   ADHD (attention deficit hyperactivity disorder)    Depression    HTN (hypertension)    Inguinal hernia of right side with obstruction and without gangrene    Past Surgical History:  Procedure Laterality Date    IRRIGATION AND DEBRIDEMENT EXTR IRRIGATION AND DEBRIDEMENT EXTREMITY (Left )EMITY (Left )  10/01/2020   HERNIA REPAIR     I & D EXTREMITY Left 10/01/2020   Procedure: IRRIGATION AND DEBRIDEMENT EXTREMITY;  Surgeon: Roby Lofts, MD;  Location: [facility] OR;  Service: Orthopedics;  Laterality: Left;   OPEN REDUCTION INTERNAL FIXATION (ORIF) DISTAL HUMERUS FRACTURE (Left )  10/01/2020   ORIF HUMERUS FRACTURE Left 10/01/2020   Procedure: OPEN REDUCTION INTERNAL FIXATION (ORIF) DISTAL HUMERUS FRACTURE;  Surgeon: Roby Lofts, MD;  Location: [facility] OR;  Service: Orthopedics;  Laterality: Left;   ORIF HUMERUS FRACTURE Left 12/12/2020   Procedure: OPEN REDUCTION INTERNAL FIXATION (ORIF) HUMERAL SHAFT FRACTURE;  Surgeon:  Roby Lofts, MD;  Location: [facility] OR;  Service: Orthopedics;  Laterality: Left;   TONSILLECTOMY     Social History   Socioeconomic History   Marital status: Single    Spouse name: Not on file   Number of children: Not on file   Years of education: Not on file   Highest education level: Not on file  Occupational History   Not on file  Tobacco Use   Smoking status: Never   Smokeless tobacco: Never  Substance and Sexual Activity   Alcohol use: Yes    Alcohol/week: 3.0 - 5.0 standard drinks of alcohol    Types: 3 - 5 Glasses of wine per week   Drug use: No   Sexual activity: Yes  Other Topics Concern   Not on file  Social History Narrative   Not on file   Social Drivers of Health   Financial Resource Strain: Not on file  Food Insecurity: Not on file  Transportation Needs: Not on file  Physical Activity: Not on file  Stress: Not on file  Social Connections: Not on file   Family History  Problem Relation Age of Onset   Anesthesia problems Neg Hx    Allergies  Allergen Reactions   Codeine Nausea And Vomiting   Current Outpatient Medications  Medication Sig Dispense Refill   methylPREDNISolone (MEDROL DOSEPAK) 4 MG TBPK tablet Take per packet instructions 1 each 0   amphetamine-dextroamphetamine (ADDERALL) 30 MG tablet Take 1 tablet by mouth 2 (two) times daily. 60 tablet 0   amphetamine-dextroamphetamine (ADDERALL) 30 MG tablet Take 1 tablet by mouth 2 (two) times daily. 60 tablet 0   busPIRone (BUSPAR) 7.5 MG tablet TAKE 1 TABLET BY MOUTH TWICE A DAY (Patient not taking: Reported on 04/13/2023) 60 tablet 5   Ginkgo Biloba Extract 60 MG CAPS Take 60 mg by mouth daily.     Glucosamine-Chondroit-Vit C-Mn (GLUCOSAMINE 1500 COMPLEX PO) Take 2 tablets by mouth daily.     hydrochlorothiazide (HYDRODIURIL) 25 MG tablet TAKE 1 TABLET BY MOUTH EVERY DAY 90 tablet 1   Milk Thistle 140 MG CAPS Take 140 mg by mouth daily.     Omega-3 Fatty Acids (FISH OIL) 1000 MG CAPS Take 1,000  mg by mouth daily.     omeprazole (PRILOSEC) 40 MG capsule Take 1 capsule (40 mg total) by mouth daily. 90 capsule 3   vitamin E 1000 UNIT capsule Take 1,000 Units by mouth daily.     No current facility-administered medications for this visit.   Facility-Administered Medications Ordered in Other Visits  Medication Dose Route Frequency Provider Last Rate Last Admin   fentaNYL (SUBLIMAZE) injection    PRN Jeani Hawking, CRNA   50 mcg at 10/18/11 0926   lidocaine (cardiac) 100 mg/67ml (XYLOCAINE) 20 MG/ML injection 2%    PRN Jeani Hawking, CRNA   80 mg at 10/18/11 0920   propofol (DIPRIVAN) 10 mg/mL bolus    PRN Jeani Hawking, CRNA   200 mg at 10/18/11 0920   No results found.  Review of Systems:   A ROS was performed including pertinent positives and negatives as documented in the HPI.  Physical Exam :   Constitutional: NAD and appears stated age Neurological: Alert and oriented Psych: Appropriate affect and cooperative There were no vitals taken for this visit.   Comprehensive Musculoskeletal Exam:    Full range of motion of the cervical spine with flexion, extension, rotation, and sidebending.  Some paraspinal muscle tenderness in the cervical spine.  Bilateral active shoulder ROM to 160 forward flexion, 60 external rotation, and internal rotation to L1.  Grip strength 5/5 bilaterally. No tenderness throughout the lumbar spine or paraspinal muscles.  Full passive hip range of motion bilaterally with flexion, ER, and IR.  Negative straight leg raise and Faber bilaterally.  Bilateral knee flexion/extension and ankle dorsiflexion/plantarflexion strength 5/5.  Imaging:   Xray (cervical spine 4 views): Loss of lordotic curvature with retrolisthesis of C5.  Notable spondylosis between C5 and C7.  Xray (lumbar spine 4 views): Lumbar vertebrae are well aligned with well-maintained intervertebral spacing   I personally reviewed and interpreted the radiographs.   Assessment:    57 y.o. female with chief complaint today of neck pain and numbness and tingling radiating down the arms into the hands.  On today's x-rays she does have some notable degenerative changes between C5 and C7.  Discussed that there could be many contributing factors to her decree sensation however my clinical suspicion is that this is radiating from the neck.  I will plan to proceed with a cervical MRI to further evaluate this as a source of nerve compression.  I may have her follow-up with Ellin Goodie and Dr. Alvester Morin depending on results of MRI for further evaluation and treatment discussion.  Discussed  that should this be negative she may need to undergo a nerve study.  I will also start her on a Medrol Dosepak today to see if this helps with her symptoms.  Plan :    -Obtain MRI of the cervical spine -Start Medrol Dosepak -Consider referral to Dr.  Blas office for MRI review     I personally saw and evaluated the patient, and participated in the management and treatment plan.  Hazle Nordmann, PA-C Orthopedics

## 2023-06-25 NOTE — Telephone Encounter (Signed)
Advised to see provider within 24 hrs   Patient Name First: Rebekah Last: Campbell Gender: Female DOB: 29-Nov-1965 Age: 57 Y 7 M 4 D Return Phone Number: [phone] (Primary), [phone] (Secondary) Address: City/ State/ Zip: [place] [facility] - Human resources officer [facility] [employer] Provider Jacquiline Doe- MD Contact Type Call Who Is Calling Patient / Member / Family / Caregiver Call Type Triage / Clinical Relationship To Patient Self Return Phone Number [phone] (Primary) Chief Complaint Arm Pain (known cause) Reason for Call Request to Speak to a Physician Initial Comment Caller is returning a call about getting an MRI referral. She has an active pinched nerve and was wondering if on call Dr could help calling in imaging in.She has tinglining in her R arm due to the pinch- she repsonds best to yes/no/single answer questions to move conversation faster. Translation No Nurse Assessment Nurse: Ladona Ridgel, RN, Felicia Date/Time (Eastern Time): 06/23/2023 3:44:11 PM Confirm and document reason for call. If symptomatic, describe symptoms. ---PT has tinkling in R arm onset 1 week ago. CHiropracter did Xray and said that there was no cushion between some vertebrae and he said she needed an MRI. NO fever. Does the patient have any new or worsening symptoms? ---Yes Will a triage be completed? ---Yes Related visit to physician within the last 2 weeks? ---No Does the PT have any chronic conditions? (i.e. diabetes, asthma, this includes High risk factors for pregnancy, etc.) ---No Is this a behavioral health or substance abuse call? ---No Guidelines Guideline Title Affirmed Question Affirmed Notes Nurse Date/Time Lamount Cohen Time) Hand Pain Weakness (i.e., loss of strength) of newonset in hand or fingers (Exceptions: Not truly weak, hand Ladona Ridgel, RN, Sunny Schlein 06/23/2023  3:47:32 PM Guidelines Guideline Title Affirmed Question Affirmed Notes Nurse Date/Time (Eastern Time) feels weak because of pain; weakness present > 2 weeks) Disp. Time Lamount Cohen Time) Disposition Final User 06/23/2023 3:54:38 PM See PCP within 24 Hours Yes Ladona Ridgel, RN, Sunny Schlein Final Disposition 06/23/2023 3:54:38 PM See PCP within 24 Hours Yes Gaddy, RN, Sunny Schlein Caller Disagree/Comply Comply Caller Understands Yes PreDisposition Did not know what to do Care Advice Given Per Guideline SEE PCP WITHIN 24 HOURS: * For pain relief, you can take either acetaminophen, ibuprofen, or naproxen. * They are over-thecounter (OTC) pain drugs. You can buy them at the drugstore. * You become worse Comments User: Hampton Abbot, RN Date/Time (Eastern Time): 06/23/2023 3:47:18 PM now she said the tingling goes from R hand to the L and both hands go completely numb - back and forth User: Hampton Abbot, RN Date/Time Lamount Cohen Time): 06/23/2023 3:52:19 PM C3 had no cushion User: Hampton Abbot, RN Date/Time Lamount Cohen Time): 06/23/2023 4:05:33 PM She has Jari Favre [facility] and Dr. Jimmey Ralph was in network for 11 months and when she finially needed help her MD dropped [employer]. She is an Advertising account planner. She needs referral to [facility] said they could use his referral to [facility] [phone] bc it will cost exponentially less if she can get referral to the UC at this location? [phone]. Told her not sure if MD can do a referral after hours bc they dont have paperwork unless they are at home. She wants a muscle relaxer - told her we cant order meds after hours User: Hampton Abbot, RN Date/Time Lamount Cohen Time): 06/23/2023 4:06:33 PM the last time she was on a muscle relaxer was some time this year User: Sunny Schlein,  Ladona Ridgel, RN Date/Time Lamount Cohen Time): 06/23/2023 4:10:17 PM she kept talking very loudly about insurance and referral - asked if she uderstood advice and she repeated it  back. Told her this nurse has not seen referrrals done after hours on the phone but if she wants the nurse to call on call we have an on call at 5 pm and I can check with them and caller her back between 5 and 5:30 pm she says "I dont think you are any help at all" She said would the UC order muscle relaxer?" told her this nurse doesnt know for sure. Comments User: Hampton Abbot, RN Date/Time Lamount Cohen Time): 06/23/2023 4:10:40 PM we ended the call bc it was such a circular conversation Referrals GO TO FACILITY UNDECIDED

## 2023-06-25 NOTE — Telephone Encounter (Signed)
Patient requesting Rebekah Campbell - [address] [phone] , Rebekah Campbell - [address] [phone]

## 2023-06-26 NOTE — Telephone Encounter (Signed)
noted 

## 2023-06-30 ENCOUNTER — Ambulatory Visit (HOSPITAL_COMMUNITY)
Admission: RE | Admit: 2023-06-30 | Discharge: 2023-06-30 | Disposition: A | Discharge status: Home / Self Care | Payer: 59 | Source: Ambulatory Visit | Attending: Student | Admitting: Student

## 2023-06-30 DIAGNOSIS — M542 Cervicalgia: Secondary | ICD-10-CM | POA: Diagnosis present

## 2023-07-03 ENCOUNTER — Encounter (HOSPITAL_BASED_OUTPATIENT_CLINIC_OR_DEPARTMENT_OTHER): Payer: Self-pay | Admitting: Student

## 2023-07-03 ENCOUNTER — Ambulatory Visit (INDEPENDENT_AMBULATORY_CARE_PROVIDER_SITE_OTHER): Payer: 59

## 2023-07-03 ENCOUNTER — Ambulatory Visit (HOSPITAL_BASED_OUTPATIENT_CLINIC_OR_DEPARTMENT_OTHER): Payer: 59 | Admitting: Student

## 2023-07-03 DIAGNOSIS — S62647A Nondisplaced fracture of proximal phalanx of left little finger, initial encounter for closed fracture: Secondary | ICD-10-CM

## 2023-07-03 DIAGNOSIS — M542 Cervicalgia: Secondary | ICD-10-CM | POA: Diagnosis not present

## 2023-07-03 DIAGNOSIS — M1812 Unilateral primary osteoarthritis of first carpometacarpal joint, left hand: Secondary | ICD-10-CM | POA: Diagnosis not present

## 2023-07-03 DIAGNOSIS — S62647D Nondisplaced fracture of proximal phalanx of left little finger, subsequent encounter for fracture with routine healing: Secondary | ICD-10-CM | POA: Diagnosis not present

## 2023-07-03 DIAGNOSIS — R200 Anesthesia of skin: Secondary | ICD-10-CM | POA: Diagnosis not present

## 2023-07-03 NOTE — Progress Notes (Signed)
 Chief Complaint: Pain in left pinky finger     History of Present Illness:   07/03/23: Patient presents today for follow-up evaluation of a left pinky finger injury.  She was seen in our clinic on 02/26/2023 at which time x-rays demonstrated a nondisplaced fifth proximal phalanx fracture.  She continued with buddy taping but did not return at that time for follow-up.  States that she has no pain in the finger and has full range of motion and utility however her finger appears larger and wants to ensure this is healed properly.   Rebekah Campbell is a 58 y.o. female presenting today for pain in the pinky finger of her left hand.  Patient states that 4 days ago she fell while doing yard work and hit her hand on something.  It has since been painful, swollen, and slightly bruised.  Pain is overall mild and this is only worsened if she bumps her finger.  She has not been taking any pain medication and does not have the finger splinted or wrapped.  She was previously seen in urgent care a few days ago and she states that x-rays taken showed a possible fracture.  She does not have any numbness and tingling in the finger or hand.   Surgical History:   None of left hand  PMH/PSH/Family History/Social History/Meds/Allergies:    Past Medical History:  Diagnosis Date   ADHD (attention deficit hyperactivity disorder)    Depression    HTN (hypertension)    Inguinal hernia of right side with obstruction and without gangrene    Past Surgical History:  Procedure Laterality Date    IRRIGATION AND DEBRIDEMENT EXTR IRRIGATION AND DEBRIDEMENT EXTREMITY (Left )EMITY (Left )  10/01/2020   HERNIA REPAIR     I & D EXTREMITY Left 10/01/2020   Procedure: IRRIGATION AND DEBRIDEMENT EXTREMITY;  Surgeon: Kendal Franky SHAUNNA, MD;  Location: [facility];  Service: Orthopedics;  Laterality: Left;   OPEN REDUCTION INTERNAL FIXATION (ORIF) DISTAL HUMERUS FRACTURE (Left )  10/01/2020   ORIF  HUMERUS FRACTURE Left 10/01/2020   Procedure: OPEN REDUCTION INTERNAL FIXATION (ORIF) DISTAL HUMERUS FRACTURE;  Surgeon: Kendal Franky SHAUNNA, MD;  Location: [facility];  Service: Orthopedics;  Laterality: Left;   ORIF HUMERUS FRACTURE Left 12/12/2020   Procedure: OPEN REDUCTION INTERNAL FIXATION (ORIF) HUMERAL SHAFT FRACTURE;  Surgeon: Kendal Franky SHAUNNA, MD;  Location: [facility];  Service: Orthopedics;  Laterality: Left;   TONSILLECTOMY     Social History   Socioeconomic History   Marital status: Single    Spouse name: Not on file   Number of children: Not on file   Years of education: Not on file   Highest education level: Not on file  Occupational History   Not on file  Tobacco Use   Smoking status: Never   Smokeless tobacco: Never  Substance and Sexual Activity   Alcohol use: Yes    Alcohol/week: 3.0 - 5.0 standard drinks of alcohol    Types: 3 - 5 Glasses of wine per week   Drug use: No   Sexual activity: Yes  Other Topics Concern   Not on file  Social History Narrative   Not on file   Social Drivers of Health   Financial Resource Strain: Not on file  Food Insecurity: Not on file  Transportation  Needs: Not on file  Physical Activity: Not on file  Stress: Not on file  Social Connections: Not on file   Family History  Problem Relation Age of Onset   Anesthesia problems Neg Hx    Allergies  Allergen Reactions   Codeine Nausea And Vomiting   Current Outpatient Medications  Medication Sig Dispense Refill   amphetamine -dextroamphetamine  (ADDERALL) 30 MG tablet Take 1 tablet by mouth 2 (two) times daily. 60 tablet 0   amphetamine -dextroamphetamine  (ADDERALL) 30 MG tablet Take 1 tablet by mouth 2 (two) times daily. 60 tablet 0   busPIRone  (BUSPAR ) 7.5 MG tablet TAKE 1 TABLET BY MOUTH TWICE A DAY (Patient not taking: Reported on 04/13/2023) 60 tablet 5   Ginkgo Biloba Extract 60 MG CAPS Take 60 mg by mouth daily.     Glucosamine-Chondroit-Vit C-Mn (GLUCOSAMINE 1500 COMPLEX PO) Take 2  tablets by mouth daily.     hydrochlorothiazide  (HYDRODIURIL ) 25 MG tablet TAKE 1 TABLET BY MOUTH EVERY DAY 90 tablet 1   methylPREDNISolone  (MEDROL  DOSEPAK) 4 MG TBPK tablet Take per packet instructions 1 each 0   Milk Thistle 140 MG CAPS Take 140 mg by mouth daily.     Omega-3 Fatty Acids (FISH OIL) 1000 MG CAPS Take 1,000 mg by mouth daily.     omeprazole  (PRILOSEC) 40 MG capsule Take 1 capsule (40 mg total) by mouth daily. 90 capsule 3   vitamin E 1000 UNIT capsule Take 1,000 Units by mouth daily.     No current facility-administered medications for this visit.   Facility-Administered Medications Ordered in Other Visits  Medication Dose Route Frequency Provider Last Rate Last Admin   fentaNYL  (SUBLIMAZE ) injection    PRN Berry, Tabatha J, CRNA   50 mcg at 10/18/11 9073   lidocaine  (cardiac) 100 mg/5ml (XYLOCAINE ) 20 MG/ML injection 2%    PRN Court Francis PARAS, CRNA   80 mg at 10/18/11 0920   propofol  (DIPRIVAN ) 10 mg/mL bolus    PRN Court Francis PARAS, CRNA   200 mg at 10/18/11 0920   No results found.  Review of Systems:   A ROS was performed including pertinent positives and negatives as documented in the HPI.  Physical Exam :   Constitutional: NAD and appears stated age Neurological: Alert and oriented Psych: Appropriate affect and cooperative There were no vitals taken for this visit.   Comprehensive Musculoskeletal Exam:    Exam of the left fifth finger is negative for any tenderness throughout the MCP, proximal phalanx, or PIP joint.  Increase in size of the PIP compared to contralateral side.  Flexor and extensor mechanisms are intact with full range of motion.  She is able to form a composite fist with good strength.  Imaging:   Xray (left hand 3 views): Stable subacute fifth proximal phalanx fracture with significant healing and surrounding bone formation   I personally reviewed and interpreted the radiographs.   Assessment:   58 y.o. female just over 4 months status  post fracture to the proximal phalanx of the left fifth finger.  X-rays taken today demonstrate significant healing and new bone formation without any further displacement of the fracture.  Discussed that at this point, her fracture is clinically healed and she can proceed without restrictions.  This may continue to remodel over time.  She can return to clinic on an as-needed basis.  Plan :    -Return to clinic as needed     I personally saw and evaluated the patient, and participated in  the management and treatment plan.  Leonce Reveal, PA-C Orthopedics

## 2023-07-03 NOTE — Addendum Note (Signed)
 Addended by: Myer Haff on: 07/03/2023 04:27 PM   Modules accepted: Orders

## 2023-07-10 ENCOUNTER — Telehealth (HOSPITAL_BASED_OUTPATIENT_CLINIC_OR_DEPARTMENT_OTHER): Payer: Self-pay | Admitting: Student

## 2023-07-10 NOTE — Telephone Encounter (Signed)
 Patient needs to get meds refill prednisone  because she has been in pain for a while and the Dr will not get her results for a little while  [phone]. Patient wants to a copy of her x-ray also wants to know if her MRI is ready and wants to know if she can get an appointment

## 2023-07-11 ENCOUNTER — Other Ambulatory Visit: Payer: Self-pay | Admitting: Family Medicine

## 2023-07-11 ENCOUNTER — Encounter: Payer: 59 | Admitting: Nurse Practitioner

## 2023-07-11 NOTE — Progress Notes (Signed)
 Rebekah Campbell arrived to visit at 2:18 with appt time 3pm. She was not available for visit once provider logged in or at her scheduled time of 3pm

## 2023-07-13 ENCOUNTER — Other Ambulatory Visit (HOSPITAL_BASED_OUTPATIENT_CLINIC_OR_DEPARTMENT_OTHER): Payer: Self-pay | Admitting: Student

## 2023-07-13 ENCOUNTER — Ambulatory Visit: Payer: 59 | Admitting: Physical Medicine and Rehabilitation

## 2023-07-13 ENCOUNTER — Encounter: Payer: 59 | Admitting: Family Medicine

## 2023-07-13 ENCOUNTER — Other Ambulatory Visit: Payer: Self-pay | Admitting: Family Medicine

## 2023-07-13 MED ORDER — MELOXICAM 15 MG PO TABS: 15.0000 mg | ORAL_TABLET | Freq: Every day | ORAL | 0 refills | Status: AC

## 2023-07-13 NOTE — Telephone Encounter (Signed)
 Copied from CRM [id number]. Topic: Clinical - Medication Refill >> Jul 13, 2023  4:42 PM Farrel B wrote: Most Recent Primary Care Visit:  Provider: KENNYTH WORTH HERO  Department: [facility]  Visit Type: OFFICE VISIT  Date: 04/13/2023  Medication:  amphetamine -dextroamphetamine  (ADDERALL) 30 MG tablet  amphetamine -dextroamphetamine  (ADDERALL) 30 MG tablet   Has the patient contacted their pharmacy? Yes (Agent: If no, request that the patient contact the pharmacy for the refill. If patient does not wish to contact the pharmacy document the reason why and proceed with request.) (Agent: If yes, when and what did the pharmacy advise?)stated she had no refills left  Is this the correct pharmacy for this prescription? Yes If no, delete pharmacy and type the correct one.  This is the patient's preferred pharmacy:  [id number] [place], [place] - [address] [place] [address] Phone: [phone] Fax: [phone]  [facility] [place] - [place] - [address] [facility] [address] [place] [address] Phone: [phone] Fax: [phone]   Has the prescription been filled recently? Yes  Is the patient out of the medication? Yes  Has the patient been seen for an appointment in the last year OR does the patient have an upcoming appointment? Yes  Can we respond through MyChart? Yes  Agent: Please be advised that Rx refills may take up to 3 business days. We ask that you follow-up with your pharmacy.

## 2023-07-13 NOTE — Telephone Encounter (Signed)
 LAST APPOINTMENT DATE: 04/13/2023   NEXT APPOINTMENT DATE: 07/13/2023/ No show     LAST REFILL: 1106/2024  QTY: 360  REF:0

## 2023-07-13 NOTE — Telephone Encounter (Signed)
  Chief Complaint: Medication Refill Frequency   Disposition: [] ED /[] Urgent Care (no appt availability in office) / [] Appointment(In office/virtual)/ []  [facility]/ [] Home Care/ [] Refused Recommended Disposition /[]  Mobile Bus/ [x]  Follow-up with PCP Additional Notes: Rebekah Campbell states the prescription is not being distributed correctly. The prescription is supposed to be for every 3 months instead of every one month.

## 2023-07-14 ENCOUNTER — Encounter: Payer: Self-pay | Admitting: Physical Medicine and Rehabilitation

## 2023-07-14 ENCOUNTER — Ambulatory Visit: Payer: 59 | Admitting: Physical Medicine and Rehabilitation

## 2023-07-14 DIAGNOSIS — M25531 Pain in right wrist: Secondary | ICD-10-CM

## 2023-07-14 DIAGNOSIS — M542 Cervicalgia: Secondary | ICD-10-CM

## 2023-07-14 DIAGNOSIS — M25532 Pain in left wrist: Secondary | ICD-10-CM | POA: Diagnosis not present

## 2023-07-14 DIAGNOSIS — R202 Paresthesia of skin: Secondary | ICD-10-CM | POA: Diagnosis not present

## 2023-07-14 MED ORDER — AMPHETAMINE-DEXTROAMPHETAMINE 30 MG PO TABS: 30.0000 mg | ORAL_TABLET | Freq: Two times a day (BID) | ORAL | 0 refills | Status: DC

## 2023-07-14 NOTE — Progress Notes (Signed)
 Rebekah Campbell - 58 y.o. female MRN [id number]  Date of birth: Jan 08, 1966  Office Visit Note: Visit Date: 07/14/2023 PCP: Kennyth Worth HERO, MD Referred by: Kennyth Worth HERO, MD  Subjective: No chief complaint on file.  HPI: Rebekah Campbell is a 58 y.o. female who comes in today per the request of Leonce Reveal, [place] for evaluation of chronic, worsening and severe bilateral neck pain and numbness/tingling and pain to both hands. Pain ongoing since middle of December. She comes in today stating her neck pain is not really a concern, paresthesias and pain to bilateral hands/wrists is biggest pain generator. She reports similar episode of tingling to bilateral hands about 6 years that resolved with bracing and time. The numbness and tingling to bilateral hands is much more severe at night and seems to get worse when she changes positions and turns her neck from side to side. She rates her symptoms as 8 out of 10. She is currently attending chiropractic treatments with Dr. Ubaldo Friedlander that she does find to be beneficial. She reports chronic bilateral wrist pain, history of multiple fractures to both wrist. Prior right wrist surgery by Dr. Prentice Pagan in 2013. She also underwent ORIF of left humeral shaft fractures with Dr. Franky Haddix in 2022. No history of bilateral upper extremity nerve study. Recent cervical MRI imaging exhibits multi level degenerative changes and foraminal impingement on the left at C4-C5 and C6-C7. No high grade spinal canal stenosis. No history of cervical surgery/injections. Patient denies focal weakness. No recent trauma or falls.       Review of Systems  Musculoskeletal:  Positive for myalgias and neck pain.  Neurological:  Positive for tingling.  All other systems reviewed and are negative.  Otherwise per HPI.  Assessment & Plan: Visit Diagnoses:    ICD-10-CM   1. Cervicalgia  M54.2     2. Paresthesia of skin  R20.2        Plan: Findings:  Chronic,  worsening and severe bilateral neck pain and paresthesias/pain to bilateral hands. Her biggest concern is paresthesias/pain to bilateral hands/wrists, she currently denies neck pain. She continues to experience symptoms despite good conservative therapies such as chiropractic treatments, home exercise regimen, rest and use of medications. Patients clinical presentation and exam are complex, her symptoms do not correlate with recent cervical MRI imaging. There is no severe nerve impingement, no severe spinal canal stenosis noted. At this point, we would not recommend performing cervical injections. Symptoms do seem more consistent with carpal tunnel syndrome. I discussed treatment plan with patient, would recommend she follow up with Dr. Pagan with [facility] as he performed prior surgery, could look at performing bilateral upper extremity nerve study. I will go ahead and place referral today, she can continue with chiropractic treatments as tolerated. No red flag symptoms noted upon exam today.       Meds & Orders: No orders of the defined types were placed in this encounter.  No orders of the defined types were placed in this encounter.   Follow-up: Return if symptoms worsen or fail to improve.   Procedures: No procedures performed      Clinical History: CLINICAL DATA:  Cervicalgia.   EXAM: MRI CERVICAL SPINE WITHOUT CONTRAST   TECHNIQUE: Multiplanar, multisequence MR imaging of the cervical spine was performed. No intravenous contrast was administered.   COMPARISON:  None Available.   FINDINGS: Alignment: Physiologic.   Vertebrae: No fracture, evidence of discitis, or bone lesion.   Cord: Normal  signal and morphology especially based on sagittal images which are less motion degraded.   Posterior Fossa, vertebral arteries, paraspinal tissues: Negative.   Disc levels:   C2-3: Unremarkable.   C3-4: Mild facet and uncovertebral spurring. The canal and foramina are patent    C4-5: Disc space narrowing with uncovertebral and facet spurring eccentric to the left where there is foraminal impingement   C5-6: Disc space narrowing with disc bulging and uncovertebral ridging. Patent spinal canal and bilateral foramen. Mild-to-moderate facet spurring on the left   C6-7: Disc narrowing and bulging with asymmetric left uncovertebral ridging and foraminal impingement. Bulging disc contributes to the left foraminal stenosis. Negative facets.   C7-T1:Mild facet spurring.  No herniation or impingement   Motion artifact especially affecting axial sequences.   IMPRESSION: 1. Multilevel cervical spine degeneration with notable foraminal impingement on the left at C4-5 and C6-7. 2. Diffusely patent spinal canal.     Electronically Signed   By: Dorn Roulette M.D.   On: 07/01/2023 09:28   She reports that she has never smoked. She has never used smokeless tobacco.  Recent Labs    09/18/22 0957  HGBA1C 5.3    Objective:  VS:  HT:    WT:   BMI:     BP:   HR: bpm  TEMP: ( )  RESP:  Physical Exam Vitals and nursing note reviewed.  HENT:     Head: Normocephalic and atraumatic.     Right Ear: External ear normal.     Left Ear: External ear normal.     Nose: Nose normal.     Mouth/Throat:     Mouth: Mucous membranes are moist.  Eyes:     Extraocular Movements: Extraocular movements intact.  Cardiovascular:     Rate and Rhythm: Normal rate.     Pulses: Normal pulses.  Pulmonary:     Effort: Pulmonary effort is normal.  Abdominal:     General: Abdomen is flat. There is no distension.  Musculoskeletal:        General: Tenderness present.     Cervical back: Tenderness present.     Comments: No discomfort noted with flexion, extension and side-to-side rotation. Patient has good strength in the upper extremities including 5 out of 5 strength in wrist extension, long finger flexion and APB. Shoulder range of motion is full bilaterally without any sign of  impingement. There is no atrophy of the hands intrinsically. Sensation intact bilaterally. Myofascial trigger points noted to bilateral levator scapulae regions. Negative Hoffman's sign. Negative Spurling's sign. Negative Phalen's bilaterally.     Skin:    General: Skin is warm and dry.     Capillary Refill: Capillary refill takes less than 2 seconds.  Neurological:     General: No focal deficit present.     Mental Status: She is alert and oriented to person, place, and time.  Psychiatric:        Mood and Affect: Mood normal.        Behavior: Behavior normal.     Ortho Exam  Imaging: No results found.  Past Medical/Family/Surgical/Social History: Medications & Allergies reviewed per EMR, new medications updated. Patient Active Problem List   Diagnosis Date Noted   Allergic contact dermatitis due to plants, except food 03/06/2023   Diarrhea 03/06/2023   Closed nondisplaced fracture of proximal phalanx of left little finger 03/06/2023   Anxiety 08/14/2020   Essential hypertension 11/27/2017   Low vitamin D  level 11/27/2017   Arthritis of carpometacarpal (CMC)  joint of left thumb 10/27/2017   TFCC (triangular fibrocartilage complex) injury, left, initial encounter 10/02/2017   Attention deficit hyperactivity disorder (ADHD), predominantly inattentive type 01/03/2016   Recurrent major depressive disorder, in full remission (HCC) 01/03/2016   Insomnia 09/21/2015   Past Medical History:  Diagnosis Date   ADHD (attention deficit hyperactivity disorder)    Depression    HTN (hypertension)    Inguinal hernia of right side with obstruction and without gangrene    Family History  Problem Relation Age of Onset   Anesthesia problems Neg Hx    Past Surgical History:  Procedure Laterality Date    IRRIGATION AND DEBRIDEMENT EXTR IRRIGATION AND DEBRIDEMENT EXTREMITY (Left )EMITY (Left )  10/01/2020   HERNIA REPAIR     I & D EXTREMITY Left 10/01/2020   Procedure: IRRIGATION AND  DEBRIDEMENT EXTREMITY;  Surgeon: Kendal Franky SQUIBB, MD;  Location: [facility];  Service: Orthopedics;  Laterality: Left;   OPEN REDUCTION INTERNAL FIXATION (ORIF) DISTAL HUMERUS FRACTURE (Left )  10/01/2020   ORIF HUMERUS FRACTURE Left 10/01/2020   Procedure: OPEN REDUCTION INTERNAL FIXATION (ORIF) DISTAL HUMERUS FRACTURE;  Surgeon: Kendal Franky SQUIBB, MD;  Location: [facility];  Service: Orthopedics;  Laterality: Left;   ORIF HUMERUS FRACTURE Left 12/12/2020   Procedure: OPEN REDUCTION INTERNAL FIXATION (ORIF) HUMERAL SHAFT FRACTURE;  Surgeon: Kendal Franky SQUIBB, MD;  Location: [facility];  Service: Orthopedics;  Laterality: Left;   TONSILLECTOMY     Social History   Occupational History   Not on file  Tobacco Use   Smoking status: Never   Smokeless tobacco: Never  Substance and Sexual Activity   Alcohol use: Yes    Alcohol/week: 3.0 - 5.0 standard drinks of alcohol    Types: 3 - 5 Glasses of wine per week   Drug use: No   Sexual activity: Yes

## 2023-07-16 ENCOUNTER — Other Ambulatory Visit: Payer: Self-pay | Admitting: Family Medicine

## 2023-07-17 ENCOUNTER — Other Ambulatory Visit (HOSPITAL_COMMUNITY): Payer: Self-pay

## 2023-07-17 ENCOUNTER — Telehealth: Payer: Self-pay | Admitting: Pharmacy Technician

## 2023-07-17 NOTE — Telephone Encounter (Signed)
Copied from CRM [id number]. Topic: Clinical - Prescription Issue >> Jul 16, 2023  5:00 PM Denese Killings wrote: Reason for CRM: [facility] stated that [facility] and patient is about to be out of mediation in 2 days. She states that she have this problem every month and wants to know what she can do to not go through this process every month. Patient wants an update before she runs out.

## 2023-07-17 NOTE — Telephone Encounter (Signed)
Pharmacy Patient Advocate Encounter  Received notification from [facility] that Prior Authorization for Amphetamine-Dextroamphetamine 30MG  tablets has been APPROVED from 07/17/2023 to 07/16/2024. Ran test claim, Copay is $0.00. This test claim was processed through [facility]- copay amounts may vary at other pharmacies due to pharmacy/plan contracts, or as the patient moves through the different stages of their insurance plan.   PA #/Case ID/Reference #:  [id number]

## 2023-07-28 ENCOUNTER — Telehealth: Payer: Self-pay | Admitting: Physical Medicine and Rehabilitation

## 2023-07-28 NOTE — Telephone Encounter (Signed)
Patient called asked if she can get the disc back that she brought with her at her appointment 07/14/2023?  The number to contact patient is [phone]

## 2023-07-31 DIAGNOSIS — M79642 Pain in left hand: Secondary | ICD-10-CM | POA: Diagnosis not present

## 2023-07-31 DIAGNOSIS — M79641 Pain in right hand: Secondary | ICD-10-CM | POA: Diagnosis not present

## 2023-09-04 ENCOUNTER — Telehealth (HOSPITAL_BASED_OUTPATIENT_CLINIC_OR_DEPARTMENT_OTHER): Payer: Self-pay | Admitting: Student

## 2023-09-04 NOTE — Telephone Encounter (Signed)
 Patient states she is in pain and would like some pain medication and she wants to know when somebody is going to tell her what is going on with her? I advise her the last person she seen was [facility]

## 2023-09-15 DIAGNOSIS — M65312 Trigger thumb, left thumb: Secondary | ICD-10-CM | POA: Diagnosis not present

## 2023-09-29 DIAGNOSIS — M19032 Primary osteoarthritis, left wrist: Secondary | ICD-10-CM | POA: Diagnosis not present

## 2023-09-29 DIAGNOSIS — G5602 Carpal tunnel syndrome, left upper limb: Secondary | ICD-10-CM | POA: Diagnosis not present

## 2023-09-29 DIAGNOSIS — G5603 Carpal tunnel syndrome, bilateral upper limbs: Secondary | ICD-10-CM | POA: Diagnosis not present

## 2023-10-05 ENCOUNTER — Other Ambulatory Visit (HOSPITAL_COMMUNITY)
Admission: RE | Admit: 2023-10-05 | Discharge: 2023-10-05 | Disposition: A | Discharge status: Home / Self Care | Source: Ambulatory Visit | Attending: Family Medicine | Admitting: Family Medicine

## 2023-10-05 ENCOUNTER — Encounter: Payer: Self-pay | Admitting: Family Medicine

## 2023-10-05 ENCOUNTER — Other Ambulatory Visit: Payer: Self-pay | Admitting: Family Medicine

## 2023-10-05 ENCOUNTER — Ambulatory Visit (INDEPENDENT_AMBULATORY_CARE_PROVIDER_SITE_OTHER): Payer: 59 | Admitting: Family Medicine

## 2023-10-05 VITALS — BP 143/88 | HR 73 | Temp 97.9°F | Ht 65.0 in | Wt 132.0 lb

## 2023-10-05 DIAGNOSIS — Z124 Encounter for screening for malignant neoplasm of cervix: Secondary | ICD-10-CM

## 2023-10-05 DIAGNOSIS — F900 Attention-deficit hyperactivity disorder, predominantly inattentive type: Secondary | ICD-10-CM

## 2023-10-05 DIAGNOSIS — Z13220 Encounter for screening for lipoid disorders: Secondary | ICD-10-CM | POA: Diagnosis not present

## 2023-10-05 DIAGNOSIS — Z0001 Encounter for general adult medical examination with abnormal findings: Secondary | ICD-10-CM

## 2023-10-05 DIAGNOSIS — Z131 Encounter for screening for diabetes mellitus: Secondary | ICD-10-CM | POA: Diagnosis not present

## 2023-10-05 DIAGNOSIS — M79672 Pain in left foot: Secondary | ICD-10-CM | POA: Diagnosis not present

## 2023-10-05 DIAGNOSIS — I10 Essential (primary) hypertension: Secondary | ICD-10-CM

## 2023-10-05 DIAGNOSIS — S92515A Nondisplaced fracture of proximal phalanx of left lesser toe(s), initial encounter for closed fracture: Secondary | ICD-10-CM | POA: Diagnosis not present

## 2023-10-05 LAB — LIPID PANEL
Cholesterol: 259 mg/dL — ABNORMAL HIGH (ref 0–200)
HDL: 149.3 mg/dL (ref >39.00)
LDL Cholesterol: 98 mg/dL (ref 0–99)
NonHDL: 109.78
Total CHOL/HDL Ratio: 2
Triglycerides: 57 mg/dL (ref 0.0–149.0)
VLDL: 11.4 mg/dL (ref 0.0–40.0)

## 2023-10-05 LAB — COMPREHENSIVE METABOLIC PANEL WITH GFR
ALT: 41 U/L — ABNORMAL HIGH (ref 0–35)
AST: 45 U/L — ABNORMAL HIGH (ref 0–37)
Albumin: 4.4 g/dL (ref 3.5–5.2)
Alkaline Phosphatase: 94 U/L (ref 39–117)
BUN: 13 mg/dL (ref 6–23)
CO2: 28 meq/L (ref 19–32)
Calcium: 9.1 mg/dL (ref 8.4–10.5)
Chloride: 102 meq/L (ref 96–112)
Creatinine, Ser: 0.61 mg/dL (ref 0.40–1.20)
GFR: 98.23 mL/min (ref >60.00)
Glucose, Bld: 96 mg/dL (ref 70–99)
Potassium: 3.8 meq/L (ref 3.5–5.1)
Sodium: 140 meq/L (ref 135–145)
Total Bilirubin: 0.4 mg/dL (ref 0.2–1.2)
Total Protein: 7.2 g/dL (ref 6.0–8.3)

## 2023-10-05 LAB — CBC
HCT: 39.4 % (ref 36.0–46.0)
Hemoglobin: 13.2 g/dL (ref 12.0–15.0)
MCHC: 33.4 g/dL (ref 30.0–36.0)
MCV: 100.7 fl — ABNORMAL HIGH (ref 78.0–100.0)
Platelets: 214 10*3/uL (ref 150.0–400.0)
RBC: 3.91 Mil/uL (ref 3.87–5.11)
RDW: 13.2 % (ref 11.5–15.5)
WBC: 5.5 10*3/uL (ref 4.0–10.5)

## 2023-10-05 LAB — TSH: TSH: 1.57 u[IU]/mL (ref 0.35–5.50)

## 2023-10-05 LAB — LDL CHOLESTEROL, DIRECT: Direct LDL: 88 mg/dL

## 2023-10-05 LAB — HEMOGLOBIN A1C: Hgb A1c MFr Bld: 5.5 % (ref 4.6–6.5)

## 2023-10-05 MED ORDER — AMPHETAMINE-DEXTROAMPHETAMINE 30 MG PO TABS: 30.0000 mg | ORAL_TABLET | Freq: Two times a day (BID) | ORAL | 0 refills | Status: DC

## 2023-10-05 NOTE — Assessment & Plan Note (Signed)
 Stable on Adderall 30 mg twice daily.  Medications help with ability stay focused on task.  No significant side effects.  Will refill today.  Follow-up in 3 to 6 months.

## 2023-10-05 NOTE — Progress Notes (Addendum)
 Chief Complaint:  Rebekah Campbell is a 58 y.o. female who presents today for her annual comprehensive physical exam.    Assessment/Plan:  New/Acute Problems: Left Toe Pain  Will be seeing orthopedics later today for this.  Likely has toe fracture.   Chronic Problems Addressed Today: Attention deficit hyperactivity disorder (ADHD), predominantly inattentive type Stable on Adderall 30 mg twice daily.  Medications help with ability stay focused on task.  No significant side effects.  Will refill today.  Follow-up in 3 to 6 months.  Essential hypertension Mildly elevated today.  Typically well-controlled.  She will continue HCTZ 25 mg daily.  She can continue to monitor at home and let us know if persistently elevated.  Preventative Healthcare: Check labs.  Pap smear performed today.  She is due for mammogram-she can schedule at her convenience.  Up-to-date on colon cancer screening.  Vaccines declined.  Patient Counseling(The following topics were reviewed and/or handout was given):  -Nutrition: Stressed importance of moderation in sodium/caffeine intake, saturated fat and cholesterol, caloric balance, sufficient intake of fresh fruits, vegetables, and fiber.  -Stressed the importance of regular exercise.   -Substance Abuse: Discussed cessation/primary prevention of tobacco, alcohol, or other drug use; driving or other dangerous activities under the influence; availability of treatment for abuse.   -Injury prevention: Discussed safety belts, safety helmets, smoke detector, smoking near bedding or upholstery.   -Sexuality: Discussed sexually transmitted diseases, partner selection, use of condoms, avoidance of unintended pregnancy and contraceptive alternatives.   -Dental health: Discussed importance of regular tooth brushing, flossing, and dental visits.  -Health maintenance and immunizations reviewed. Please refer to Health maintenance section.  Return to care in 1 year for next  preventative visit.     Subjective:  HPI:  She has no acute complaints today. See Assessment / plan for status of chronic conditions.  She injured her left fourth toe couple of days ago while running.  She has scheduled appointment to see orthopedics later today for this.  She has noticed pain and bruising to that toe.  She is concerned for fracture.  Lifestyle Diet: Balanced. Plenty of fruits and vegetables.  Exercise: Runs frequently.      10/05/2023   11:03 AM  Depression screen PHQ 2/9  Decreased Interest 1  Down, Depressed, Hopeless 1  PHQ - 2 Score 2  Altered sleeping 1  Tired, decreased energy 1  Change in appetite 1  Feeling bad or failure about yourself  1  Trouble concentrating 1  Moving slowly or fidgety/restless 1  Suicidal thoughts 0  PHQ-9 Score 8  Difficult doing work/chores Somewhat difficult    Health Maintenance Due  Topic Date Due   MAMMOGRAM  05/08/2022   Cervical Cancer Screening (HPV/Pap Cotest)  01/12/2023     ROS: Per HPI, otherwise a complete review of systems was negative.   PMH:  The following were reviewed and entered/updated in epic: Past Medical History:  Diagnosis Date   ADHD (attention deficit hyperactivity disorder)    Depression    HTN (hypertension)    Inguinal hernia of right side with obstruction and without gangrene    Patient Active Problem List   Diagnosis Date Noted   Allergic contact dermatitis due to plants, except food 03/06/2023   Diarrhea 03/06/2023   Anxiety 08/14/2020   Essential hypertension 11/27/2017   Low vitamin D level 11/27/2017   Arthritis of carpometacarpal (CMC) joint of left thumb 10/27/2017   TFCC (triangular fibrocartilage complex) injury, left, initial encounter 10/02/2017  Attention deficit hyperactivity disorder (ADHD), predominantly inattentive type 01/03/2016   Recurrent major depressive disorder, in full remission (HCC) 01/03/2016   Insomnia 09/21/2015   Past Surgical History:  Procedure  Laterality Date    IRRIGATION AND DEBRIDEMENT EXTR IRRIGATION AND DEBRIDEMENT EXTREMITY (Left )EMITY (Left )  10/01/2020   HERNIA REPAIR     I & D EXTREMITY Left 10/01/2020   Procedure: IRRIGATION AND DEBRIDEMENT EXTREMITY;  Surgeon: Roby Lofts, MD;  Location: [facility] OR;  Service: Orthopedics;  Laterality: Left;   OPEN REDUCTION INTERNAL FIXATION (ORIF) DISTAL HUMERUS FRACTURE (Left )  10/01/2020   ORIF HUMERUS FRACTURE Left 10/01/2020   Procedure: OPEN REDUCTION INTERNAL FIXATION (ORIF) DISTAL HUMERUS FRACTURE;  Surgeon: Roby Lofts, MD;  Location: [facility] OR;  Service: Orthopedics;  Laterality: Left;   ORIF HUMERUS FRACTURE Left 12/12/2020   Procedure: OPEN REDUCTION INTERNAL FIXATION (ORIF) HUMERAL SHAFT FRACTURE;  Surgeon: Roby Lofts, MD;  Location: [facility] OR;  Service: Orthopedics;  Laterality: Left;   TONSILLECTOMY      Family History  Problem Relation Age of Onset   Anesthesia problems Neg Hx     Medications- reviewed and updated Current Outpatient Medications  Medication Sig Dispense Refill   Ginkgo Biloba Extract 60 MG CAPS Take 60 mg by mouth daily.     Glucosamine-Chondroit-Vit C-Mn (GLUCOSAMINE 1500 COMPLEX PO) Take 2 tablets by mouth daily.     hydrochlorothiazide (HYDRODIURIL) 25 MG tablet TAKE 1 TABLET BY MOUTH EVERY DAY 90 tablet 1   methylPREDNISolone (MEDROL DOSEPAK) 4 MG TBPK tablet Take per packet instructions 1 each 0   Milk Thistle 140 MG CAPS Take 140 mg by mouth daily.     Omega-3 Fatty Acids (FISH OIL) 1000 MG CAPS Take 1,000 mg by mouth daily.     omeprazole (PRILOSEC) 40 MG capsule Take 1 capsule (40 mg total) by mouth daily. 90 capsule 3   vitamin E 1000 UNIT capsule Take 1,000 Units by mouth daily.     amphetamine-dextroamphetamine (ADDERALL) 30 MG tablet Take 1 tablet by mouth 2 (two) times daily. 60 tablet 0   No current facility-administered medications for this visit.   Facility-Administered Medications Ordered in Other Visits  Medication Dose Route  Frequency Provider Last Rate Last Admin   fentaNYL (SUBLIMAZE) injection    PRN Jeani Hawking, CRNA   50 mcg at 10/18/11 0926   lidocaine (cardiac) 100 mg/71ml (XYLOCAINE) 20 MG/ML injection 2%    PRN Jeani Hawking, CRNA   80 mg at 10/18/11 0920   propofol (DIPRIVAN) 10 mg/mL bolus    PRN Jeani Hawking, CRNA   200 mg at 10/18/11 0920    Allergies-reviewed and updated Allergies  Allergen Reactions   Codeine Nausea And Vomiting    Social History   Socioeconomic History   Marital status: Single    Spouse name: Not on file   Number of children: Not on file   Years of education: Not on file   Highest education level: Bachelor's degree (e.g., BA, AB, BS)  Occupational History   Not on file  Tobacco Use   Smoking status: Never   Smokeless tobacco: Never  Substance and Sexual Activity   Alcohol use: Yes    Alcohol/week: 3.0 - 5.0 standard drinks of alcohol    Types: 3 - 5 Glasses of wine per week   Drug use: No   Sexual activity: Yes  Other Topics Concern   Not on file  Social History Narrative   Not  on file   Social Drivers of Health   Financial Resource Strain: Medium Risk (10/04/2023)   Overall Financial Resource Strain (CARDIA)    Difficulty of Paying Living Expenses: Somewhat hard  Food Insecurity: No Food Insecurity (10/04/2023)   Hunger Vital Sign    Worried About Running Out of Food in the Last Year: Never true    Ran Out of Food in the Last Year: Never true  Transportation Needs: No Transportation Needs (10/04/2023)   PRAPARE - Administrator, Civil Service (Medical): No    Lack of Transportation (Non-Medical): No  Physical Activity: Sufficiently Active (10/04/2023)   Exercise Vital Sign    Days of Exercise per Week: 6 days    Minutes of Exercise per Session: 50 min  Stress: No Stress Concern Present (10/04/2023)   [employer] of Occupational Health - Occupational Stress Questionnaire    Feeling of Stress : Only a little  Social Connections:  Moderately Integrated (10/04/2023)   Social Connection and Isolation Panel [NHANES]    Frequency of Communication with Friends and Family: Twice a week    Frequency of Social Gatherings with Friends and Family: Once a week    Attends Religious Services: More than 4 times per year    Active Member of [employer] or Organizations: No    Attends Engineer, structural: Not on file    Marital Status: Living with partner        Objective:  Physical Exam: BP (!) 143/88   Pulse 73   Temp 97.9 F (36.6 C) (Temporal)   Ht 5\' 5"  (1.651 m)   Wt 132 lb (59.9 kg)   SpO2 97%   BMI 21.97 kg/m   Body mass index is 21.97 kg/m. Wt Readings from Last 3 Encounters:  10/05/23 132 lb (59.9 kg)  04/13/23 126 lb 9.6 oz (57.4 kg)  11/26/22 132 lb 3.2 oz (60 kg)   Gen: NAD, resting comfortably HEENT: TMs normal bilaterally. OP clear. No thyromegaly noted.  CV: RRR with no murmurs appreciated Pulm: NWOB, CTAB with no crackles, wheezes, or rhonchi GI: Normal bowel sounds present. Soft, Nontender, Nondistended. MSK: no edema, cyanosis, or clubbing noted.  Left fourth digit of foot swollen with ecchymosis.  Neurovascular intact distally. GU: Normal internal and external female genitalia.  Chaperone present for exam. Skin: warm, dry Neuro: CN2-12 grossly intact. Strength 5/5 in upper and lower extremities. Reflexes symmetric and intact bilaterally.  Psych: Normal affect and thought content     Derwood Becraft M. Jimmey Ralph, MD 10/05/2023 11:18 AM

## 2023-10-05 NOTE — Assessment & Plan Note (Signed)
 Mildly elevated today.  Typically well-controlled.  She will continue HCTZ 25 mg daily.  She can continue to monitor at home and let us know if persistently elevated.

## 2023-10-05 NOTE — Patient Instructions (Signed)
 It was very nice to see you today!  We will check blood work today.  We performed your Pap smear today.  I will refill your medications.  I will see you back in 6 months for medication check.  Please come back sooner if needed.  Return in about 6 months (around 04/05/2024) for Follow Up.   Take care, Dr Jimmey Ralph  PLEASE NOTE:  If you had any lab tests, please let us know if you have not heard back within a few days. You may see your results on mychart before we have a chance to review them but we will give you a call once they are reviewed by [place].   If we ordered any referrals today, please let us know if you have not heard from their office within the next week.   If you had any urgent prescriptions sent in today, please check with the pharmacy within an hour of our visit to make sure the prescription was transmitted appropriately.   Please try these tips to maintain a healthy lifestyle:  Eat at least 3 REAL meals and 1-2 snacks per day.  Aim for no more than 5 hours between eating.  If you eat breakfast, please do so within one hour of getting up.   Each meal should contain half fruits/vegetables, one quarter protein, and one quarter carbs (no bigger than a computer mouse)  Cut down on sweet beverages. This includes juice, soda, and sweet tea.   Drink at least 1 glass of water with each meal and aim for at least 8 glasses per day  Exercise at least 150 minutes every week.    Preventive Care 75-59 Years Old, Female Preventive care refers to lifestyle choices and visits with your health care provider that can promote health and wellness. Preventive care visits are also called wellness exams. What can I expect for my preventive care visit? Counseling Your health care provider may ask you questions about your: Medical history, including: Past medical problems. Family medical history. Pregnancy history. Current health, including: Menstrual cycle. Method of birth  control. Emotional well-being. Home life and relationship well-being. Sexual activity and sexual health. Lifestyle, including: Alcohol, nicotine or tobacco, and drug use. Access to firearms. Diet, exercise, and sleep habits. Work and work Astronomer. Sunscreen use. Safety issues such as seatbelt and bike helmet use. Physical exam Your health care provider will check your: Height and weight. These may be used to calculate your BMI (body mass index). BMI is a measurement that tells if you are at a healthy weight. Waist circumference. This measures the distance around your waistline. This measurement also tells if you are at a healthy weight and may help predict your risk of certain diseases, such as type 2 diabetes and high blood pressure. Heart rate and blood pressure. Body temperature. Skin for abnormal spots. What immunizations do I need?  Vaccines are usually given at various ages, according to a schedule. Your health care provider will recommend vaccines for you based on your age, medical history, and lifestyle or other factors, such as travel or where you work. What tests do I need? Screening Your health care provider may recommend screening tests for certain conditions. This may include: Lipid and cholesterol levels. Diabetes screening. This is done by checking your blood sugar (glucose) after you have not eaten for a while (fasting). Pelvic exam and Pap test. Hepatitis B test. Hepatitis C test. HIV (human immunodeficiency virus) test. STI (sexually transmitted infection) testing, if you are at risk.  Lung cancer screening. Colorectal cancer screening. Mammogram. Talk with your health care provider about when you should start having regular mammograms. This may depend on whether you have a family history of breast cancer. BRCA-related cancer screening. This may be done if you have a family history of breast, ovarian, tubal, or peritoneal cancers. Bone density scan. This is  done to screen for osteoporosis. Talk with your health care provider about your test results, treatment options, and if necessary, the need for more tests. Follow these instructions at home: Eating and drinking  Eat a diet that includes fresh fruits and vegetables, whole grains, lean protein, and low-fat dairy products. Take vitamin and mineral supplements as recommended by your health care provider. Do not drink alcohol if: Your health care provider tells you not to drink. You are pregnant, may be pregnant, or are planning to become pregnant. If you drink alcohol: Limit how much you have to 0-1 drink a day. Know how much alcohol is in your drink. In the [place] one drink equals one 12 oz bottle of beer (355 mL), one 5 oz glass of wine (148 mL), or one 1 oz glass of hard liquor (44 mL). Lifestyle Brush your teeth every morning and night with fluoride toothpaste. Floss one time each day. Exercise for at least 30 minutes 5 or more days each week. Do not use any products that contain nicotine or tobacco. These products include cigarettes, chewing tobacco, and vaping devices, such as e-cigarettes. If you need help quitting, ask your health care provider. Do not use drugs. If you are sexually active, practice safe sex. Use a condom or other form of protection to prevent STIs. If you do not wish to become pregnant, use a form of birth control. If you plan to become pregnant, see your health care provider for a prepregnancy visit. Take aspirin only as told by your health care provider. Make sure that you understand how much to take and what form to take. Work with your health care provider to find out whether it is safe and beneficial for you to take aspirin daily. Find healthy ways to manage stress, such as: Meditation, yoga, or listening to music. Journaling. Talking to a trusted person. Spending time with friends and family. Minimize exposure to UV radiation to reduce your risk of skin  cancer. Safety Always wear your seat belt while driving or riding in a vehicle. Do not drive: If you have been drinking alcohol. Do not ride with someone who has been drinking. When you are tired or distracted. While texting. If you have been using any mind-altering substances or drugs. Wear a helmet and other protective equipment during sports activities. If you have firearms in your house, make sure you follow all gun safety procedures. Seek help if you have been physically or sexually abused. What's next? Visit your health care provider once a year for an annual wellness visit. Ask your health care provider how often you should have your eyes and teeth checked. Stay up to date on all vaccines. This information is not intended to replace advice given to you by your health care provider. Make sure you discuss any questions you have with your health care provider. Document Revised: 12/12/2020 Document Reviewed: 12/12/2020 Elsevier Patient Education  2024 [employer].

## 2023-10-06 DIAGNOSIS — M79642 Pain in left hand: Secondary | ICD-10-CM | POA: Diagnosis not present

## 2023-10-06 DIAGNOSIS — M79641 Pain in right hand: Secondary | ICD-10-CM | POA: Diagnosis not present

## 2023-10-06 LAB — CYTOLOGY - PAP
Comment: NEGATIVE
Diagnosis: NEGATIVE
High risk HPV: NEGATIVE

## 2023-10-07 ENCOUNTER — Encounter: Payer: Self-pay | Admitting: Family Medicine

## 2023-10-07 ENCOUNTER — Telehealth: Payer: Self-pay | Admitting: Family Medicine

## 2023-10-07 NOTE — Progress Notes (Signed)
 Her Pap smear is normal.  We can repeat in 5 years.  Her liver numbers are mildly elevated but stable compared to her baseline.  The rest of her labs are all stable.  She should keep up the great work with diet and exercise and we can recheck in a year or so.

## 2023-10-07 NOTE — Telephone Encounter (Signed)
**Note De-identified  [place] Obfuscation** Please advise 

## 2023-10-07 NOTE — Telephone Encounter (Signed)
 Reason for CRM: Patient is concerned if she should do another bone density testing, she states she's taking her vitamins daily but she wants to know if she should take more than just that because she over the counter medicine is not as strong than what Dr prescribes. She also mentioned she is concerned about the extra scan she did last year about her Osteoporosis. She states she just broke her foot so she wants an order to be put in because she breaks things frequently. She would like for her Dr to call back.

## 2023-10-08 NOTE — Telephone Encounter (Signed)
 Please schedule a bone density scan appt

## 2023-10-08 NOTE — Telephone Encounter (Signed)
 Looks like her DEXA is scheduled for later this year.  Rebekah Campbell. Jimmey Ralph, MD 10/08/2023 7:59 AM

## 2023-10-09 NOTE — Telephone Encounter (Signed)
 She should take at least 800 IU of vitamin D daily.  Rebekah Campbell. Jimmey Ralph, MD 10/09/2023 12:49 PM

## 2023-10-09 NOTE — Telephone Encounter (Signed)
 Called patient notified take at least 800 unit of vit D  Patient was angry at our call system, stared to yell over the phone stated call 7 days ago with no answer.  (Patient call yesterday at 3:40. 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

## 2023-10-09 NOTE — Telephone Encounter (Signed)
**Note De-identified  [place] Obfuscation** Please advise 

## 2023-11-17 DIAGNOSIS — G5602 Carpal tunnel syndrome, left upper limb: Secondary | ICD-10-CM | POA: Diagnosis not present

## 2023-12-04 ENCOUNTER — Ambulatory Visit: Payer: Self-pay

## 2023-12-04 NOTE — Telephone Encounter (Signed)
 FYI Only or Action Required?: FYI only for provider  Patient was last seen in primary care on 10/05/2023 by Rodney Clamp, MD. Called Nurse Triage reporting Epistaxis. Symptoms began today. Interventions attempted: Nothing. Symptoms are: unchanged.  Triage Disposition: Go to ED or PCP/Alternative with Approval  Patient/caregiver understands and will follow disposition?: Yes    Copied from CRM [id number]. Topic: Clinical - Red Word Triage >> Dec 04, 2023  3:40 PM Alyse July wrote: Red Word that prompted transfer to Nurse Triage: Patient has a sinus infection and there is blood going down her throat each time she swallows . Reason for Disposition  Patient sounds very sick or weak to the triager  Answer Assessment - Initial Assessment Questions 1. SYMPTOM: "What's the main symptom you're concerned about?" (e.g., runny nose, stuffiness, sneezing, itching)     Blood when she blows her nose and dripping down her throat started today 2. SEVERITY: "How bad is it?" "What does it keep you from doing?" (e.g., sleeping, working)      mod 3. EYES: "Are the eyes also red, watery, and itchy?"      Water and itchy eyes 4. TRIGGER: "What pollen or other allergic substance do you think is causing the symptoms?"      pollen 5. TREATMENT: "What medicine are you using?" "What medicine worked best in the past?"     no 6. OTHER SYMPTOMS: "Do you have any other symptoms?" (e.g., coughing, difficulty breathing, wheezing)     no   States that she was also having blood from her ears on about 5 days ago. Pat  Answer Assessment - Initial Assessment Questions 1. AMOUNT OF BLEEDING: "How bad is the bleeding?" "How much blood was lost?" "Has the bleeding stopped?"   - MILD: needed a couple tissues   - MODERATE: needed many tissues   - SEVERE: large blood clots, soaked many tissues, lasted more than 30 minutes      Bleeding down back of throat so doesn't know but states a lot 2. ONSET: "When did the nosebleed  start?"      today 3. FREQUENCY: "How many nosebleeds have you had in the last 24 hours?"      1 4. RECURRENT SYMPTOMS: "Have there been other recent nosebleeds?" If Yes, ask: "How long did it take you to stop the bleeding?" "What worked best?"      no 5. CAUSE: "What do you think caused this nosebleed?"     sinus 6. LOCAL FACTORS: "Do you have any cold symptoms?", "Have you been rubbing or picking at your nose?"     no  8. BLOOD THINNERS: "Do you take any blood thinners?" (e.g., aspirin , clopidogrel / Plavix, coumadin, heparin). Notes: Other strong blood thinners include: Arixtra (fondaparinux), Eliquis (apixaban), Pradaxa (dabigatran), and Xarelto (rivaroxaban).     no 9. OTHER SYMPTOMS: "Do you have any other symptoms?" (e.g., lightheadedness)     Fatigue and discharge from ear  Protocols used: Nasal Allergies (Hay Fever)-A-AH, Nosebleed-A-AH

## 2023-12-08 NOTE — Telephone Encounter (Signed)
 Noted

## 2023-12-24 ENCOUNTER — Other Ambulatory Visit: Payer: Self-pay | Admitting: Family Medicine

## 2023-12-24 NOTE — Telephone Encounter (Signed)
 Copied from CRM [id number]. Topic: Clinical - Medication Refill >> Dec 24, 2023  3:33 PM Viola F wrote: Medication: amphetamine -dextroamphetamine  (ADDERALL) 30 MG tablet [[id number]]  Has the patient contacted their pharmacy? Yes (Agent: If no, request that the patient contact the pharmacy for the refill. If patient does not wish to contact the pharmacy document the reason why and proceed with request.) (Agent: If yes, when and what did the pharmacy advise?)  This is the patient's preferred pharmacy:   [id number] - [place], [place] - [address] [place] [address] Phone: [phone] Fax: [phone]  Is this the correct pharmacy for this prescription? Yes If no, delete pharmacy and type the correct one.   Has the prescription been filled recently? Yes  Is the patient out of the medication? No, 2 pills left   Has the patient been seen for an appointment in the last year OR does the patient have an upcoming appointment? Yes  Can we respond through MyChart? Yes  Agent: Please be advised that Rx refills may take up to 3 business days. We ask that you follow-up with your pharmacy.

## 2023-12-24 NOTE — Telephone Encounter (Signed)
 Last OV: 10/05/23  Next OV: none scheduled  Last Filled: 10/05/23  Quantity: 60

## 2023-12-25 MED ORDER — AMPHETAMINE-DEXTROAMPHETAMINE 30 MG PO TABS: 30.0000 mg | ORAL_TABLET | Freq: Two times a day (BID) | ORAL | 0 refills | Status: DC

## 2024-02-15 ENCOUNTER — Telehealth: Payer: Self-pay | Admitting: Family Medicine

## 2024-02-15 ENCOUNTER — Other Ambulatory Visit: Payer: 59

## 2024-02-15 NOTE — Telephone Encounter (Unsigned)
 Copied from CRM #[id number]. Topic: Referral - Question >> Feb 15, 2024  4:16 PM Gennette ORN wrote: Reason for CRM: The referral stated they don't do bone density and mammogram anymore. She needs another referral.

## 2024-02-16 NOTE — Telephone Encounter (Signed)
Ok to placed referral  

## 2024-02-16 NOTE — Telephone Encounter (Signed)
 Ok with me. Please place any necessary orders.

## 2024-02-19 ENCOUNTER — Other Ambulatory Visit: Payer: Self-pay | Admitting: *Deleted

## 2024-02-19 ENCOUNTER — Other Ambulatory Visit: Payer: Self-pay | Admitting: Family Medicine

## 2024-02-19 DIAGNOSIS — Z1239 Encounter for other screening for malignant neoplasm of breast: Secondary | ICD-10-CM

## 2024-02-19 DIAGNOSIS — Z780 Asymptomatic menopausal state: Secondary | ICD-10-CM

## 2024-02-19 MED ORDER — AMPHETAMINE-DEXTROAMPHETAMINE 30 MG PO TABS: 30.0000 mg | ORAL_TABLET | Freq: Two times a day (BID) | ORAL | 0 refills | Status: DC

## 2024-02-19 NOTE — Telephone Encounter (Signed)
 Referral placed.

## 2024-02-19 NOTE — Telephone Encounter (Signed)
 Copied from CRM #[id number]. Topic: Clinical - Medication Refill >> Feb 19, 2024  1:28 PM Chiquita SQUIBB wrote: Medication: amphetamine -dextroamphetamine  (ADDERALL) 30 MG tablet [[id number]]  Patient is requesting a 90 day supply, patient has requested this before and does not receive the 90 day supply, patient is asking why the 90 day supply has not been able to happen.    Has the patient contacted their pharmacy? Yes (Agent: If no, request that the patient contact the pharmacy for the refill. If patient does not wish to contact the pharmacy document the reason why and proceed with request.) (Agent: If yes, when and what did the pharmacy advise?)  This is the patient's preferred pharmacy:  [id number] - [place], [place] - [address] [place] [address] Phone: [phone] Fax: [phone]  Is this the correct pharmacy for this prescription? Yes If no, delete pharmacy and type the correct one.   Has the prescription been filled recently? No  Is the patient out of the medication? Yes  Has the patient been seen for an appointment in the last year OR does the patient have an upcoming appointment? Yes  Can we respond through MyChart? Yes  Agent: Please be advised that Rx refills may take up to 3 business days. We ask that you follow-up with your pharmacy.

## 2024-03-02 ENCOUNTER — Encounter: Payer: Self-pay | Admitting: Family Medicine

## 2024-03-06 ENCOUNTER — Encounter (HOSPITAL_BASED_OUTPATIENT_CLINIC_OR_DEPARTMENT_OTHER): Payer: Self-pay

## 2024-03-06 ENCOUNTER — Other Ambulatory Visit: Payer: Self-pay

## 2024-03-06 ENCOUNTER — Emergency Department (HOSPITAL_BASED_OUTPATIENT_CLINIC_OR_DEPARTMENT_OTHER)
Admission: EM | Admit: 2024-03-06 | Discharge: 2024-03-06 | Disposition: A | Discharge status: Home / Self Care | Attending: Emergency Medicine | Admitting: Emergency Medicine

## 2024-03-06 DIAGNOSIS — S39012A Strain of muscle, fascia and tendon of lower back, initial encounter: Secondary | ICD-10-CM | POA: Diagnosis not present

## 2024-03-06 DIAGNOSIS — Y9302 Activity, running: Secondary | ICD-10-CM | POA: Insufficient documentation

## 2024-03-06 DIAGNOSIS — X58XXXA Exposure to other specified factors, initial encounter: Secondary | ICD-10-CM | POA: Diagnosis not present

## 2024-03-06 DIAGNOSIS — S3992XA Unspecified injury of lower back, initial encounter: Secondary | ICD-10-CM | POA: Diagnosis not present

## 2024-03-06 DIAGNOSIS — S76019A Strain of muscle, fascia and tendon of unspecified hip, initial encounter: Secondary | ICD-10-CM

## 2024-03-06 MED ORDER — PREDNISONE 20 MG PO TABS
20.0000 mg | ORAL_TABLET | Freq: Once | ORAL | Status: AC
  Administered 2024-03-06: 20 mg via ORAL
  Filled 2024-03-06: qty 1

## 2024-03-06 MED ORDER — KETOROLAC TROMETHAMINE 30 MG/ML IJ SOLN
30.0000 mg | Freq: Once | INTRAMUSCULAR | Status: AC
  Administered 2024-03-06: 30 mg via INTRAMUSCULAR
  Filled 2024-03-06: qty 1

## 2024-03-06 MED ORDER — PREDNISONE 10 MG PO TABS: 20.0000 mg | ORAL_TABLET | Freq: Every day | ORAL | 0 refills | Status: AC

## 2024-03-06 NOTE — ED Notes (Signed)
 Pt writhing and moving around in room; unable to lie or sit. Does not want to wait, as she is in pain that makes my eyes roll back. Pt advised that we are doing the best we can.

## 2024-03-06 NOTE — ED Notes (Signed)
 Pt discharged home after verbalizing understanding of discharge instructions; nad noted.

## 2024-03-06 NOTE — ED Provider Notes (Signed)
 [place] [facility]PART[facility] AT [facility] Provider Note   CSN: [id number] Arrival date & time: 03/06/24  1906     Patient presents with: Back Pain   Rebekah Campbell is a 58 y.o. female.  Who presents to the ED for buttock pain.  Patient is an avid runner.  She ran today.  Over the last 3 days has had a spasm sensation in her right upper buttock.  No known inciting injury or trauma.    Back Pain      Prior to Admission medications   Medication Sig Start Date End Date Taking? Authorizing Provider  amphetamine -dextroamphetamine  (ADDERALL) 30 MG tablet Take 1 tablet by mouth 2 (two) times daily. 02/19/24   Kennyth Worth HERO, MD  Ginkgo Biloba Extract 60 MG CAPS Take 60 mg by mouth daily.    [provider]  Glucosamine-Chondroit-Vit C-Mn (GLUCOSAMINE 1500 COMPLEX PO) Take 2 tablets by mouth daily.    [provider]  hydrochlorothiazide  (HYDRODIURIL ) 25 MG tablet TAKE 1 TABLET BY MOUTH EVERY DAY 03/06/23   Kennyth Worth HERO, MD  methylPREDNISolone  (MEDROL  DOSEPAK) 4 MG TBPK tablet Take per packet instructions 06/25/23   Emiliano Leonce CROME, PA-C  Milk Thistle 140 MG CAPS Take 140 mg by mouth daily.    [provider]  Omega-3 Fatty Acids (FISH OIL) 1000 MG CAPS Take 1,000 mg by mouth daily.    [provider]  omeprazole  (PRILOSEC) 40 MG capsule Take 1 capsule (40 mg total) by mouth daily. 04/06/23   Kennyth Worth HERO, MD  vitamin E 1000 UNIT capsule Take 1,000 Units by mouth daily.    [provider]    Allergies: Codeine    Review of Systems  Musculoskeletal:  Positive for back pain.    Updated Vital Signs BP (!) 150/97   Pulse (!) 103   Temp 97.9 F (36.6 C) (Oral)   Resp 16   SpO2 97%   Physical Exam Vitals and nursing note reviewed.  HENT:     Head: Normocephalic and atraumatic.  Eyes:     Pupils: Pupils are equal, round, and reactive to light.  Cardiovascular:     Rate and Rhythm: Normal rate and regular rhythm.   Pulmonary:     Effort: Pulmonary effort is normal.     Breath sounds: Normal breath sounds.  Abdominal:     Palpations: Abdomen is soft.     Tenderness: There is no abdominal tenderness.  Musculoskeletal:     Comments: Point tenderness over upper right glute max  Skin:    General: Skin is warm and dry.  Neurological:     Mental Status: She is alert.  Psychiatric:        Mood and Affect: Mood normal.     (all labs ordered are listed, but only abnormal results are displayed) Labs Reviewed - No data to display  EKG: None  Radiology: No results found.   Procedures   Medications Ordered in the ED  ketorolac  (TORADOL ) 30 MG/ML injection 30 mg (30 mg Intramuscular Given 03/06/24 2203)                                    Medical Decision Making 58 year old female with history as above presenting for spasm of right buttock.  Point tenderness.  Likely musculoskeletal strain.  No pain in the lower extremities weakness or sensory deficit.  Will provide Toradol  and instruct for  symptomatic management.  Risk Prescription drug management.        Final diagnoses:  Strain of tendon of gluteus muscle    ED Discharge Orders     None          Pamella Ozell LABOR, DO 03/06/24 2244

## 2024-03-06 NOTE — Discharge Instructions (Addendum)
 You were seen in the emergency room for right buttock pain We gave you a shot of Toradol  which seemed to help with the pain Take Tylenol  Motrin as directed Stretch the area well before running again Return to the emergency room for severe pain or any other concerns

## 2024-03-06 NOTE — ED Triage Notes (Signed)
 Pt states that she has known sciatica and has had increased pain in right lower back/buttock x 3 days. Pt is requesting a cortisone shot because that is what has helped her in past. Denies specific acute injury.

## 2024-03-11 ENCOUNTER — Telehealth: Payer: Self-pay

## 2024-03-11 NOTE — Telephone Encounter (Signed)
 Transition Care Management Unsuccessful Follow-up Telephone Call  Date of discharge and from where:  03/06/24; [facility]  Attempts:  1st Attempt  Reason for unsuccessful TCM follow-up call:  Left voice message for patient to complete TOC call and schedule ED follow up if needed.

## 2024-04-12 ENCOUNTER — Other Ambulatory Visit: Payer: Self-pay | Admitting: Family Medicine

## 2024-04-12 ENCOUNTER — Ambulatory Visit: Payer: Self-pay

## 2024-04-12 ENCOUNTER — Ambulatory Visit (HOSPITAL_BASED_OUTPATIENT_CLINIC_OR_DEPARTMENT_OTHER)
Admission: RE | Admit: 2024-04-12 | Discharge: 2024-04-12 | Disposition: A | Discharge status: Home / Self Care | Source: Ambulatory Visit | Attending: Family Medicine | Admitting: Family Medicine

## 2024-04-12 ENCOUNTER — Inpatient Hospital Stay (HOSPITAL_BASED_OUTPATIENT_CLINIC_OR_DEPARTMENT_OTHER)
Admission: RE | Admit: 2024-04-12 | Discharge: 2024-04-12 | Disposition: A | Source: Ambulatory Visit | Attending: Family Medicine | Admitting: Family Medicine

## 2024-04-12 ENCOUNTER — Encounter (HOSPITAL_BASED_OUTPATIENT_CLINIC_OR_DEPARTMENT_OTHER): Payer: Self-pay | Admitting: Radiology

## 2024-04-12 ENCOUNTER — Other Ambulatory Visit: Payer: Self-pay | Admitting: Medical Genetics

## 2024-04-12 DIAGNOSIS — Z1231 Encounter for screening mammogram for malignant neoplasm of breast: Secondary | ICD-10-CM | POA: Diagnosis not present

## 2024-04-12 DIAGNOSIS — Z780 Asymptomatic menopausal state: Secondary | ICD-10-CM | POA: Insufficient documentation

## 2024-04-12 DIAGNOSIS — M8580 Other specified disorders of bone density and structure, unspecified site: Secondary | ICD-10-CM | POA: Diagnosis not present

## 2024-04-12 DIAGNOSIS — Z1239 Encounter for other screening for malignant neoplasm of breast: Secondary | ICD-10-CM | POA: Insufficient documentation

## 2024-04-12 DIAGNOSIS — M810 Age-related osteoporosis without current pathological fracture: Secondary | ICD-10-CM | POA: Diagnosis not present

## 2024-04-12 MED ORDER — AMPHETAMINE-DEXTROAMPHETAMINE 30 MG PO TABS: 30.0000 mg | ORAL_TABLET | Freq: Two times a day (BID) | ORAL | 0 refills | Status: DC

## 2024-04-12 NOTE — Telephone Encounter (Signed)
 FYI Only or Action Required?: Action required by provider: request for appointment and clinical question for provider.  Patient was last seen in primary care on 10/05/2023 by Kennyth Worth HERO, MD.  Called Nurse Triage reporting Chest Injury.  Symptoms began a week ago.  Interventions attempted: Ice/heat application.  Symptoms are: unchanged.  Triage Disposition: See Physician Within 24 Hours  Patient/caregiver understands and will follow disposition?: No, wishes to speak with PCP  Copied from CRM #[id number]. Topic: Clinical - Red Word Triage >> Apr 12, 2024 11:04 AM Nessti S wrote: [facility] that prompted transfer to Nurse Triage: broke ribs Reason for Disposition  [1] MODERATE pain (e.g., interferes with normal activities) AND [2] high-risk adult (e.g., age > 60 years, osteoporosis, chronic steroid use)  Answer Assessment - Initial Assessment Questions No available appts today. Advised UC/ED today, patient declined and requesting appt and order for chest xray.  Patient declines UC and ED; cost too high and reports only wants appt with Kennyth MD.  Bone density and mammogram imaging today; trying to schedule appt for results; trying to get ahead and to go over test results.  1. MECHANISM: How did the injury happen?     Tripped over dog 2. ONSET: When did the injury happen? (.e.g., minutes, hours, days ago)     2 weeks 3. LOCATION: Where on the chest is the injury located?     Left side 4. APPEARANCE: What does the injury look like?     no 5. BLEEDING: Is there any bleeding now? If Yes, ask: How long has it been bleeding?     no 6. SEVERITY: Any difficulty with breathing?     denies 7. SIZE: For cuts, bruises, or swelling, ask: How large is it? (e.g., inches or centimeters)     denies 8. PAIN: Is there pain? If Yes, ask: How bad is the pain? (e.g., Scale 0-10; none, mild, moderate, severe)     9/10  Protocols used: Chest Injury-A-AH

## 2024-04-12 NOTE — Telephone Encounter (Signed)
Patient need OV with PCP  

## 2024-04-12 NOTE — Telephone Encounter (Signed)
 LVM to schedule ov with primary or available provider

## 2024-04-13 ENCOUNTER — Ambulatory Visit: Payer: Self-pay

## 2024-04-13 ENCOUNTER — Encounter: Payer: Self-pay | Admitting: Family Medicine

## 2024-04-13 ENCOUNTER — Ambulatory Visit: Payer: Self-pay | Admitting: Family Medicine

## 2024-04-13 DIAGNOSIS — M810 Age-related osteoporosis without current pathological fracture: Secondary | ICD-10-CM

## 2024-04-13 NOTE — Telephone Encounter (Signed)
 FYI Only or Action Required?: Action required by provider: clinical question for provider.  Patient was last seen in primary care on 10/05/2023 by Rebekah Worth HERO, MD.  Called Nurse Triage reporting Pain.  Symptoms began x 2 weeks.  Interventions attempted: Rest, hydration, or home remedies.  Symptoms are: gradually worsening.  Triage Disposition: See Physician Within 24 Hours  Patient/caregiver understands and will follow disposition?: No, wishes to speak with PCP    Copied from CRM #[id number]. Topic: Clinical - Red Word Triage >> Apr 13, 2024  5:42 PM Rebekah Campbell ORN wrote: Red Word that prompted transfer to Nurse Triage:  broken ribs for two weeks, but hasn't been in the hospital Reason for Disposition  Fever present > 3 days (72 hours)  Answer Assessment - Initial Assessment Questions 1. ONSET: When did the muscle aches or body pains start?      Fell 2 weeks ago and pt stated she broken her ribs 2. LOCATION: What part of your body is hurting? (e.g., entire body, arms, legs)      Ribs on Left side 3. SEVERITY: How bad is the pain? (Scale 1-10; or mild, moderate, severe)     Moderate to severe 4. CAUSE: What do you think is causing the pains?     Fall and broken ribs 5. FEVER: Do you have a fever? If Yes, ask: What is your temperature, how was it measured, and  when did it start?      no 6. OTHER SYMPTOMS: Do you have any other symptoms? (e.g., chest pain, cold or flu symptoms, rash, weakness, weight loss)     na 7. PREGNANCY: Is there any chance you are pregnant? When was your last menstrual period?     na 8. TRAVEL: Have you traveled out of the country in the last month? (e.g., exposures, travel history)     na  Hard to lay on side and back .  Pt stated she does not think she has punctured her lungs.  Offered pt appt for tomorrow and pt refused. Pt stated she does not want to come in yet - pt is requesting an order for xray for rib area - pt would to request  [facility] location for xray if possible and pt would like to schedule an appt after the xray results and results for bone density test results.  Please call pt back will updated plan of care.  Protocols used: Muscle Aches and Body Pain-A-AH

## 2024-04-13 NOTE — Progress Notes (Signed)
 Bone density scan indicates osteoporosis.  Recommend referral to osteoporosis clinic to discuss further management options.

## 2024-04-14 ENCOUNTER — Other Ambulatory Visit: Payer: Self-pay | Admitting: Family Medicine

## 2024-04-14 DIAGNOSIS — R921 Mammographic calcification found on diagnostic imaging of breast: Secondary | ICD-10-CM

## 2024-04-14 DIAGNOSIS — R928 Other abnormal and inconclusive findings on diagnostic imaging of breast: Secondary | ICD-10-CM

## 2024-04-14 NOTE — Telephone Encounter (Signed)
 Patient need OV for assessment and X-ray

## 2024-04-14 NOTE — Telephone Encounter (Signed)
 Pt is upset. Received Mammo results on MyChart. Access tried to contact pt several times but no response.   Patient Name First: Rebekah Last: Campbell Gender: Female DOB: 07-Jan-1966 Age: 58 Y 4 M 26 D Return Phone Number: [phone] (Primary) Address: City/ State/ Zip: [place]  [address] Client [facility] - Human resources officer [facility] [employer] Provider Kennyth Bart- MD Contact Type Call Who Is Calling Patient / Member / Family / Caregiver Call Type Triage / Clinical Relationship To Patient Self Return Phone Number [phone] (Primary) Chief Complaint Health information question (non symptomatic) Reason for Call Symptomatic / Request for Health Information Initial Comment Caller states she wants to know to her results on her breast cancer. She is wanting to talk to the doctor about her results and is adamant on it. She wants a virtual visit or something to get her results read to her. She got tested yesterday and is upset about the results. She is very upset that it wasn't read to her and is just nervous about if she has breast cancer. Translation No Disp. Time Titus Time) Disposition Final User 04/13/2024 11:01:37 PM Send To RN Personal Verlyn, RN, Asberry 04/13/2024 11:11:17 PM FINAL ATTEMPT MADE - message left Jolanda OBIE Raisin 04/13/2024 11:11:22 PM Send to RN Final Attempt Vallery Jolanda, RN, Jessica 04/14/2024 1:27:15 AM Attempt made - message left Genie, RN, Levorn 04/14/2024 1:46:15 AM FINAL ATTEMPT MADE - message left Yes Genie RN, Levorn Final Disposition 04/14/2024 1:46:15 AM FINAL ATTEMPT MADE - message left Yes Genie, RN, Levorn

## 2024-04-15 NOTE — Telephone Encounter (Signed)
 See previews note

## 2024-04-15 NOTE — Telephone Encounter (Signed)
 Spoke with pt on 10/16 around 3:30. Explained to her that the MM didn't show that you have cancer, and we need to do diagnostic MM for clarification. Explained to her that the protocol is for radiology to reach out to pt with results and get them schedule for diagnostic MM. She said that no one called her. Called Radiology at [facility] about this, they stated that they don't do diagnostic MM, and that PCP has to put an order for it to go to Breast cancer radiology. Order placed, referral team member contacted pt and schedule Dx MM.  Pt also was complaining of rib pain, I told her that she need to schedule and appt with Dr. Kennyth or any other provider for evaluation. Pt voiced understanding and schedule an appointment for Monday 04/18/24.

## 2024-04-15 NOTE — Telephone Encounter (Signed)
 Pt is scheduled for 04/18/24 at 10:40am

## 2024-04-18 ENCOUNTER — Telehealth: Payer: Self-pay | Admitting: Family Medicine

## 2024-04-18 ENCOUNTER — Telehealth: Payer: Self-pay | Admitting: *Deleted

## 2024-04-18 ENCOUNTER — Encounter: Payer: Self-pay | Admitting: Family Medicine

## 2024-04-18 ENCOUNTER — Ambulatory Visit
Admission: RE | Admit: 2024-04-18 | Discharge: 2024-04-18 | Disposition: A | Discharge status: Home / Self Care | Source: Ambulatory Visit | Attending: Family Medicine | Admitting: Family Medicine

## 2024-04-18 ENCOUNTER — Ambulatory Visit (INDEPENDENT_AMBULATORY_CARE_PROVIDER_SITE_OTHER): Admitting: Family Medicine

## 2024-04-18 ENCOUNTER — Ambulatory Visit (INDEPENDENT_AMBULATORY_CARE_PROVIDER_SITE_OTHER)

## 2024-04-18 VITALS — BP 132/88 | HR 102 | Temp 97.5°F | Ht 65.0 in | Wt 130.6 lb

## 2024-04-18 DIAGNOSIS — R921 Mammographic calcification found on diagnostic imaging of breast: Secondary | ICD-10-CM | POA: Diagnosis not present

## 2024-04-18 DIAGNOSIS — R928 Other abnormal and inconclusive findings on diagnostic imaging of breast: Secondary | ICD-10-CM

## 2024-04-18 DIAGNOSIS — M810 Age-related osteoporosis without current pathological fracture: Secondary | ICD-10-CM | POA: Insufficient documentation

## 2024-04-18 DIAGNOSIS — F900 Attention-deficit hyperactivity disorder, predominantly inattentive type: Secondary | ICD-10-CM | POA: Diagnosis not present

## 2024-04-18 DIAGNOSIS — R0789 Other chest pain: Secondary | ICD-10-CM | POA: Diagnosis not present

## 2024-04-18 DIAGNOSIS — J309 Allergic rhinitis, unspecified: Secondary | ICD-10-CM | POA: Insufficient documentation

## 2024-04-18 DIAGNOSIS — G4700 Insomnia, unspecified: Secondary | ICD-10-CM

## 2024-04-18 DIAGNOSIS — I10 Essential (primary) hypertension: Secondary | ICD-10-CM | POA: Diagnosis not present

## 2024-04-18 DIAGNOSIS — S2242XA Multiple fractures of ribs, left side, initial encounter for closed fracture: Secondary | ICD-10-CM | POA: Diagnosis not present

## 2024-04-18 MED ORDER — AMPHETAMINE-DEXTROAMPHETAMINE 30 MG PO TABS: 30.0000 mg | ORAL_TABLET | Freq: Two times a day (BID) | ORAL | 0 refills | Status: DC

## 2024-04-18 MED ORDER — TRAZODONE HCL 50 MG PO TABS: 25.0000 mg | ORAL_TABLET | Freq: Every evening | ORAL | 3 refills | Status: AC | PRN

## 2024-04-18 NOTE — Telephone Encounter (Signed)
 Pt is already scheduled for 04/18/24.  Patient Name First: Rebekah Last: Campbell Gender: Female DOB: [birth year] Age: 58 Y 4 M 27 D Return Phone Number: [phone] (Primary) Address: City/ State/ Zip: [place]  [address] Client [facility] - Human resources officer [facility] [employer] Provider Kennyth Bart- MD Contact Type Call Who Is Calling Patient / Member / Family / Caregiver Call Type Triage / Clinical Relationship To Patient Self Return Phone Number [phone] (Primary) Chief Complaint Rash - Localized Reason for Call Medication Question / Request Initial Comment Caller states she is having a rash and needs a prescription. Her butt is chapped. The patient is ranting and will not let me speak. Call was disconnected. She may want to complain about her treatment by us  and healthcare in general. Translation No Nurse Assessment Nurse: Rosalita, RN, Reche Date/Time (Eastern Time): 04/14/2024 7:43:08 PM Confirm and document reason for call. If symptomatic, describe symptoms. ---Caller will not let RN speak, even after attempts to interrupt politely. From what this nurse can gather she is having bowel movement issues. She has urinated in the last 8 hours. She was having dark diarrhea to orangey brown. Does the patient have any new or worsening symptoms? ---Yes Will a triage be completed? ---Yes Related visit to physician within the last 2 weeks? ---No Does the PT have any chronic conditions? (i.e. diabetes, asthma, this includes High risk factors for pregnancy, etc.) ---Unknown Is this a behavioral health or substance abuse call? ---No Guidelines Guideline Title Affirmed Question Affirmed Notes Nurse Date/Time (Eastern Time) Diarrhea [1] SEVERE diarrhea (e.g., 7 or more times / day more than normal) AND [2] Scates, RN, Reche 04/14/2024 8:04:13 PM  Guidelines Guideline Title Affirmed Question  Affirmed Notes Nurse Date/Time (Eastern Time) present > 24 hours (1 day) Disp. Time Titus Time) Disposition Final User 04/14/2024 8:09:35 PM See PCP within 24 Hours Yes Scates, RN, Reche Final Disposition 04/14/2024 8:09:35 PM See PCP within 24 Hours Yes Scates, RN, Reche [place] Disagree/Comply Comply Caller Understands Yes PreDisposition InappropriateToAsk Care Advice Given Per Guideline SEE PCP WITHIN 24 HOURS: * IF OFFICE WILL BE CLOSED: You need to be seen within the next 24 hours. A clinic or an urgent care center is often a good source of care if your doctor's office is closed or you can't get an appointment. FLUID THERAPY DURING SEVERE DIARRHEA: * Drink more fluids, at least 8 to 10 cups daily. One cup equals 8 oz (240 ml). * AVOID caffeinated beverages. Reason: Caffeine is mildly dehydrating. * SPORTS DRINKS: You can also drink half-strength sports drinks (such as Gatorade, Powerade) to help treat and prevent dehydration. Mix the sports drink half and half with water. * AVOID alcohol beverages (such as beer, wine, hard liquor). * AVOID carbonated soft drinks (soda) as these can make your diarrhea worse. FOOD AND NUTRITION DURING SEVERE DIARRHEA: * Drinking enough liquids is more important than eating when you have severe diarrhea. * As the diarrhea starts to get better, you can slowly return to a normal diet. * Begin with boiled starches / cereals (such as potatoes, rice, noodles, wheat, oats) with a small amount of salt. * You can also eat bananas, yogurt, crackers, soup. DIARRHEA MEDICINE - LOPERAMIDE (IMODIUM AD): * This medicine helps decrease diarrhea. It is available over-the-counter (OTC) in a drugstore. * Adult dosage: 4 mg (2 capsules) is the recommended first dose. You may take an additional 2 mg (1 capsule) after each  loose stool. WASH YOUR HANDS: * Wash your hands with soap and water after using the bathroom. * Wash your hands before fixing or eating food. CALL  BACK IF: * Signs of dehydration occur (such as no urine over 12 hours, very dry mouth, lightheaded) * Bloody stools * Constant or severe abdomen pain * You become worse CARE ADVICE given per Diarrhea (Adult) guideline. Comments User: Reche Jointer, RN Date/Time Titus Time): 04/14/2024 8:15:29 PM Pt was talking excessively fast, ranting, would not let nurse speak, no current symptoms that were concerning. Referrals REFERRED TO PCP OFFICE

## 2024-04-18 NOTE — Progress Notes (Signed)
 Rebekah Campbell is a 58 y.o. female who presents today for an office visit.  Assessment/Plan:  New/Acute Problems: Left Rib Pain  X-ray today in clinic.  She does have a small nondisplaced fracture based on my read though we will await radiology read.  Patient reports that she did have a rib fracture several months ago as well.  No red flag signs or symptoms.  Reassuring lung exam today.  Pain is currently controlled.  Do not think we need to do any further testing or evaluation at this point.  She will let us  know if pain does not continue to improve over the next few weeks.  Chronic Problems Addressed Today: Attention deficit hyperactivity disorder (ADHD), predominantly inattentive type Overall stable on Adderall 30 mg twice daily.  Medications help with ability to stay focused on task.  Database was reviewed without red flags.  Will refill today for 90-day supply though discussed with patient that her insurance may not pay for this.  Essential hypertension At well today on HCTZ 25 mg daily.  She can monitor at home and let us  know if persistently elevated.  Allergic rhinitis Patient with small amount of eustachian tube dysfunction today on exam.  No red flags.  She can use over-the-counter allergy meds as needed.  Insomnia Patient with mild flareup recently.  She has been on trazodone  in the past and has done with this would like to restart temporarily.  Will start 25 to 50 mg nightly.  She can increase to 100 mg nightly if needed.  She will follow-up with us  in a few weeks via MyChart.  She is aware potential side effects.  Osteoporosis Reviewed recent bone density scan which showed T-score -2.6.  She is on calcium and vitamin D  supplements and will be seeing the osteoporosis clinic next week.     Subjective:  HPI:  See assessment / plan for status of chronic conditions.   Discussed the use of AI scribe software for clinical note transcription with the patient, who gave verbal  consent to proceed.  History of Present Illness Rebekah Campbell is a 58 year old female with osteoporosis who presents with rib pain following a fall.  She experiences rib pain after a fall caused by her dog tripping her. She landed on her left side and describes the sensation as feeling like her ribs are 'floating'. She has difficulty sleeping due to the pain, especially when lying flat, as it 'almost takes a breath out.' She has been using ice and heat for relief but finds it challenging to get comfortable.  She has a history of rib fractures, with a previous incident occurring six months ago when she cracked a rib while reaching into a fish pond. She recalls hearing a 'pop' during that incident. She manages the pain and does not require additional pain medication at this time.  She has osteoporosis, with a recent bone density test indicating a T-score of minus 2.6. She takes calcium and magnesium supplements and engages in regular weight-bearing exercises. She experienced early menopause at 25, which she believes may have contributed to her current bone health status.  She discusses sleep difficulties, particularly during the furniture market event, where she struggles to align her sleep schedule with guests who sleep early. She has tried trazodone  in the past, which she believes was effective, but finds melatonin insufficient.  She mentions a history of frequent bone fractures, often related to her active lifestyle involving pets and physical activities like biking  and horseback riding. She has broken her arm, foot, and ankles in the past, attributing these injuries to her active lifestyle.  She reports a recent issue with her ears, noting decreased hearing in one ear and possible bleeding, which she suspects might be related to sinus issues. She uses ear drops and nasal strips to manage her symptoms.         Objective:  Physical Exam: BP 132/88   Pulse (!) 102   Temp (!) 97.5 F (36.4  C) (Temporal)   Ht 5' 5 (1.651 m)   Wt 130 lb 9.6 oz (59.2 kg)   SpO2 98%   BMI 21.73 kg/m   Gen: No acute distress, resting comfortably HEENT: TMs with clear effusion bilaterally. CV: Regular rate and rhythm with no murmurs appreciated Pulm: Normal work of breathing, clear to auscultation bilaterally with no crackles, wheezes, or rhonchi MUSCULOSKELETAL: Left chest wall without deformities though some slight tenderness to palpation. Neuro: Grossly normal, moves all extremities Psych: Normal affect and thought content  Time Spent: 45 minutes of total time was spent on the date of the encounter performing the following actions: chart review prior to seeing the patient including recent imaging and today's xray, obtaining history, performing a medically necessary exam, counseling on the treatment plan, placing orders, and documenting in our EHR.        Worth HERO. Kennyth, MD 04/18/2024 11:57 AM

## 2024-04-18 NOTE — Assessment & Plan Note (Signed)
 Patient with small amount of eustachian tube dysfunction today on exam.  No red flags.  She can use over-the-counter allergy meds as needed.

## 2024-04-18 NOTE — Assessment & Plan Note (Signed)
 Patient with mild flareup recently.  She has been on trazodone  in the past and has done with this would like to restart temporarily.  Will start 25 to 50 mg nightly.  She can increase to 100 mg nightly if needed.  She will follow-up with us  in a few weeks via MyChart.  She is aware potential side effects.

## 2024-04-18 NOTE — Assessment & Plan Note (Signed)
 At well today on HCTZ 25 mg daily.  She can monitor at home and let us  know if persistently elevated.

## 2024-04-18 NOTE — Telephone Encounter (Signed)
 Copied from CRM #[id number]. Topic: Clinical - Lab/Test Results >> Apr 14, 2024  1:50 PM Dedra B wrote: Reason for CRM: Pt calling regarding bone density and mammogram results. Pt is very upset about not receiving call regarding results. Warm transfer to CAL   Duplicated  Sella. RMA

## 2024-04-18 NOTE — Assessment & Plan Note (Signed)
 Reviewed recent bone density scan which showed T-score -2.6.  She is on calcium and vitamin D  supplements and will be seeing the osteoporosis clinic next week.

## 2024-04-18 NOTE — Assessment & Plan Note (Signed)
 Overall stable on Adderall 30 mg twice daily.  Medications help with ability to stay focused on task.  Database was reviewed without red flags.  Will refill today for 90-day supply though discussed with patient that her insurance may not pay for this.

## 2024-04-18 NOTE — Patient Instructions (Signed)
 It was very nice to see you today!  VISIT SUMMARY: Today, you were seen for rib pain following a fall, and we discussed your ongoing management of osteoporosis, insomnia, ADHD, and ear issues.  YOUR PLAN: LEFT RIB FRACTURE, NONDISPLACED: You have a nondisplaced fracture in your left rib causing discomfort but manageable pain. -Continue using ice and heat for pain relief as needed. -Healing is expected in 4-6 weeks.  OSTEOPOROSIS, AGE-RELATED: Your osteoporosis is confirmed with a T-score of -2.6, likely related to early menopause and estrogen deficiency. -Continue taking calcium and magnesium supplements. -Engage in regular weight-bearing exercises. -You have an appointment with osteoporosis specialist Ronal Caldron Persons on October 30.  INSOMNIA: You have difficulty sleeping, especially during the furniture market event. -Trazodone  has been prescribed for sleep management.  ATTENTION-DEFICIT HYPERACTIVITY DISORDER (ADHD): Your ADHD is managed with Adderall, but there are insurance issues with the 7-month supply. -A prescription for a 46-month supply of Adderall has been sent. -Monitor the insurance response and adjust the prescription duration if necessary.  EUSTACHIAN TUBE DYSFUNCTION WITH SEROUS OTITIS MEDIA: You have fluid behind your eardrum due to allergies, but no infection or significant ear issues. -Manage your allergies as needed.  GENERAL HEALTH MAINTENANCE: Routine health maintenance was discussed, including a follow-up mammogram due to dense breast tissue and previous calcifications. -Complete a diagnostic mammogram today and discuss the results with the radiologist.  Return if symptoms worsen or fail to improve.   Take care, Dr Kennyth  PLEASE NOTE:  If you had any lab tests, please let us  know if you have not heard back within a few days. You may see your results on mychart before we have a chance to review them but we will give you a call once they are reviewed by us .    If we ordered any referrals today, please let us  know if you have not heard from their office within the next week.   If you had any urgent prescriptions sent in today, please check with the pharmacy within an hour of our visit to make sure the prescription was transmitted appropriately.   Please try these tips to maintain a healthy lifestyle:  Eat at least 3 REAL meals and 1-2 snacks per day.  Aim for no more than 5 hours between eating.  If you eat breakfast, please do so within one hour of getting up.   Each meal should contain half fruits/vegetables, one quarter protein, and one quarter carbs (no bigger than a computer mouse)  Cut down on sweet beverages. This includes juice, soda, and sweet tea.   Drink at least 1 glass of water with each meal and aim for at least 8 glasses per day  Exercise at least 150 minutes every week.

## 2024-04-18 NOTE — Telephone Encounter (Signed)
 Patient had an OV with PCP today

## 2024-04-20 ENCOUNTER — Telehealth: Payer: Self-pay | Admitting: *Deleted

## 2024-04-20 NOTE — Telephone Encounter (Signed)
 Copied from CRM [id number]. Topic: Clinical - Prescription Issue >> Apr 18, 2024  5:15 PM Rebekah Campbell wrote: Reason for CRM: Pt said she received a call from pharmacy saying that due to her insurance, her prescriptions for adderrall, and trazodone  were unable to be filled the way they are written. Pls call pt.

## 2024-04-21 ENCOUNTER — Ambulatory Visit: Payer: Self-pay | Admitting: Family Medicine

## 2024-04-21 NOTE — Progress Notes (Signed)
 Radiology reviewed her x-rays and confirmed that she does have 2 small displaced fractures on her left 8th and 9th ribs.  As we discussed at her visit this should heal on its own over the next several weeks.  She should let us  know if she does not have improvement in her symptoms over the next several weeks.

## 2024-04-21 NOTE — Telephone Encounter (Signed)
 Can we clarify what they need for the prescription to be filled?  Worth HERO. Kennyth, MD 04/21/2024 8:10 AM

## 2024-04-22 NOTE — Telephone Encounter (Signed)
 Spoke with patient pharmacy, patient pick up Rx on 04/18/2024

## 2024-04-28 ENCOUNTER — Ambulatory Visit (INDEPENDENT_AMBULATORY_CARE_PROVIDER_SITE_OTHER): Admitting: Physician Assistant

## 2024-04-28 VITALS — Ht 65.0 in | Wt 136.0 lb

## 2024-04-28 DIAGNOSIS — M810 Age-related osteoporosis without current pathological fracture: Secondary | ICD-10-CM

## 2024-04-28 NOTE — Progress Notes (Signed)
 Office Visit Note   Patient: Rebekah Campbell           Date of Birth: 02/24/1966           MRN: [id number] Visit Date: 04/28/2024              Requested by: Kennyth Worth HERO, MD [address] [place],  [place] [address] PCP: Kennyth Worth HERO, MD   Assessment & Plan: Visit Diagnoses:  1. Age-related osteoporosis without current pathological fracture     Plan: I spent 45 minutes talking to the patient about osteoporosis medications and treatment.  She does have an elevated FRAX of 4.3% for hip fracture and an average risk for overall osteoporotic fracture.  She does not have any cardiovascular risk and does not have a personal history of cancer.  She has no history of kidney disease or ulcers.  She has no history of gastric bypass or reflux.  No history of seizures.  She does have a history of going through menopause at age 3 without hormone replacement therapy.  She is taking adequate calcium and vitamin D  but I have asked her to track her calcium intake.  She never been a smoker and she drinks occasionally.  She engages in both cardio and resistive training on a regular basis.  No dental issues and no family history of fragility fracture she herself has had fractures of her wrist and her hip.  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  She would like to try Tymlos if that is a possibility.  I reviewed the risks of this medication and its therapeutic values.  We will go ahead with authorization.  Follow-Up Instructions: Return if symptoms worsen or fail to improve.   Orders:  No orders of the defined types were placed in this encounter.  No orders of the defined types were placed in this encounter.     Procedures: No procedures performed   Clinical Data: No additional findings.   Subjective: No chief complaint on file.   HPI pleasant 58 year old woman referred by Dr. Kennyth for evaluation of  osteoporosis  Review of Systems  All other systems reviewed and are negative.    Objective: Vital Signs: Ht 5' 5 (1.651 m)   Wt 136 lb (61.7 kg)   BMI 22.63 kg/m   Physical Exam Skin:    General: Skin is warm and dry.  Neurological:     General: No focal deficit present.     Mental Status: She is oriented to person, place, and time.  Psychiatric:        Mood and Affect: Mood normal.        Behavior: Behavior normal.     Ortho Exam  Specialty Comments:  CLINICAL DATA:  Cervicalgia.   EXAM: MRI CERVICAL SPINE WITHOUT CONTRAST   TECHNIQUE: Multiplanar, multisequence MR imaging of the cervical spine was performed. No intravenous contrast was administered.   COMPARISON:  None Available.   FINDINGS: Alignment: Physiologic.   Vertebrae: No fracture, evidence of discitis, or bone lesion.   Cord: Normal signal and morphology especially based on sagittal images which are less motion degraded.   Posterior Fossa, vertebral arteries, paraspinal tissues: Negative.   Disc levels:   C2-3: Unremarkable.   C3-4: Mild facet and uncovertebral spurring. The canal and foramina are patent   C4-5: Disc space narrowing with uncovertebral and facet spurring eccentric to the  left where there is foraminal impingement   C5-6: Disc space narrowing with disc bulging and uncovertebral ridging. Patent spinal canal and bilateral foramen. Mild-to-moderate facet spurring on the left   C6-7: Disc narrowing and bulging with asymmetric left uncovertebral ridging and foraminal impingement. Bulging disc contributes to the left foraminal stenosis. Negative facets.   C7-T1:Mild facet spurring.  No herniation or impingement   Motion artifact especially affecting axial sequences.   IMPRESSION: 1. Multilevel cervical spine degeneration with notable foraminal impingement on the left at C4-5 and C6-7. 2. Diffusely patent spinal canal.     Electronically Signed   By: Dorn Roulette  M.D.   On: 07/01/2023 09:28  Imaging: No results found.   PMFS History: Patient Active Problem List   Diagnosis Date Noted   Age-related osteoporosis without current pathological fracture 04/18/2024   Allergic rhinitis 04/18/2024   Allergic contact dermatitis due to plants, except food 03/06/2023   Diarrhea 03/06/2023   Anxiety 08/14/2020   Essential hypertension 11/27/2017   Low vitamin D  level 11/27/2017   Arthritis of carpometacarpal [facility]) joint of left thumb 10/27/2017   TFCC (triangular fibrocartilage complex) injury, left, initial encounter 10/02/2017   Attention deficit hyperactivity disorder (ADHD), predominantly inattentive type 01/03/2016   Recurrent major depressive disorder, in full remission 01/03/2016   Insomnia 09/21/2015   Past Medical History:  Diagnosis Date   ADHD (attention deficit hyperactivity disorder)    Depression    HTN (hypertension)    Inguinal hernia of right side with obstruction and without gangrene     Family History  Problem Relation Age of Onset   Anesthesia problems Neg Hx     Past Surgical History:  Procedure Laterality Date    IRRIGATION AND DEBRIDEMENT EXTR IRRIGATION AND DEBRIDEMENT EXTREMITY (Left )EMITY (Left )  10/01/2020   HERNIA REPAIR     I & D EXTREMITY Left 10/01/2020   Procedure: IRRIGATION AND DEBRIDEMENT EXTREMITY;  Surgeon: Kendal Franky SQUIBB, MD;  Location: [facility] OR;  Service: Orthopedics;  Laterality: Left;   OPEN REDUCTION INTERNAL FIXATION (ORIF) DISTAL HUMERUS FRACTURE (Left )  10/01/2020   ORIF HUMERUS FRACTURE Left 10/01/2020   Procedure: OPEN REDUCTION INTERNAL FIXATION (ORIF) DISTAL HUMERUS FRACTURE;  Surgeon: Kendal Franky SQUIBB, MD;  Location: [facility] OR;  Service: Orthopedics;  Laterality: Left;   ORIF HUMERUS FRACTURE Left 12/12/2020   Procedure: OPEN REDUCTION INTERNAL FIXATION (ORIF) HUMERAL SHAFT FRACTURE;  Surgeon: Kendal Franky SQUIBB, MD;  Location: [facility] OR;  Service: Orthopedics;  Laterality: Left;   TONSILLECTOMY     Social  History   Occupational History   Not on file  Tobacco Use   Smoking status: Never   Smokeless tobacco: Never  Substance and Sexual Activity   Alcohol use: Yes    Alcohol/week: 3.0 - 5.0 standard drinks of alcohol    Types: 3 - 5 Glasses of wine per week   Drug use: No   Sexual activity: Yes

## 2024-05-02 ENCOUNTER — Encounter: Payer: Self-pay | Admitting: Radiology

## 2024-05-03 ENCOUNTER — Other Ambulatory Visit: Payer: Self-pay | Admitting: Radiology

## 2024-05-03 MED ORDER — TYMLOS 3120 MCG/1.56ML ~~LOC~~ SOPN: 80.0000 ug | PEN_INJECTOR | Freq: Every day | SUBCUTANEOUS | 12 refills | Status: AC

## 2024-05-03 NOTE — Progress Notes (Signed)
 Rx Tymlos sent into specialty pharmacy.  Patient aware.

## 2024-05-12 ENCOUNTER — Encounter: Payer: Self-pay | Admitting: Family Medicine

## 2024-05-12 ENCOUNTER — Ambulatory Visit: Payer: Self-pay | Admitting: Family Medicine

## 2024-05-12 ENCOUNTER — Ambulatory Visit

## 2024-05-12 ENCOUNTER — Ambulatory Visit: Payer: Self-pay | Admitting: *Deleted

## 2024-05-12 ENCOUNTER — Ambulatory Visit (INDEPENDENT_AMBULATORY_CARE_PROVIDER_SITE_OTHER): Admitting: Family Medicine

## 2024-05-12 VITALS — BP 110/64 | HR 96 | Temp 97.3°F | Ht 65.0 in | Wt 131.0 lb

## 2024-05-12 DIAGNOSIS — R0981 Nasal congestion: Secondary | ICD-10-CM

## 2024-05-12 DIAGNOSIS — M25572 Pain in left ankle and joints of left foot: Secondary | ICD-10-CM

## 2024-05-12 DIAGNOSIS — R0683 Snoring: Secondary | ICD-10-CM

## 2024-05-12 DIAGNOSIS — M67479 Ganglion, unspecified ankle and foot: Secondary | ICD-10-CM | POA: Diagnosis not present

## 2024-05-12 DIAGNOSIS — Z8781 Personal history of (healed) traumatic fracture: Secondary | ICD-10-CM

## 2024-05-12 MED ORDER — MELOXICAM 7.5 MG PO TABS: 7.5000 mg | ORAL_TABLET | Freq: Every day | ORAL | 0 refills | Status: AC

## 2024-05-12 NOTE — Patient Instructions (Addendum)
 X-ray today  Antiinflammatory meloxicam  for 10 days. Take with food. Can cause stomach bleeds- if any dark black stool or blood in stool stop and let us  know as soon as possible   We have placed a referral for you today to [facility]iatry-[facility] please call their # if you do not hear within a week (may be listed below or you may see mychart message within a few days with #).   We have placed a referral for you today to [facility] with [facility]- please call their # if you do not hear within a week (may be listed below or you may see mychart message within a few days with #).    Recommended follow up: Return for as needed for new, worsening, persistent symptoms.

## 2024-05-12 NOTE — Telephone Encounter (Signed)
 FYI Only or Action Required?: FYI only for provider: appointment scheduled on 11/13.  Patient was last seen in primary care on 04/18/2024 by Kennyth Worth HERO, MD.  Called Nurse Triage reporting Ankle Pain.  Symptoms began several days ago.  Interventions attempted: Rest, hydration, or home remedies.  Symptoms are: unchanged.  Triage Disposition: See PCP When Office is Open (Within 3 Days)  Patient/caregiver understands and will follow disposition?: Yes-Patient states she lives 9 minutes away from office and can be at 2:00 appointment  Copied from CRM #[id number]. Topic: Clinical - Red Word Triage >> May 12, 2024 12:58 PM Melissa C wrote: Red Word that prompted transfer to Nurse Triage: patient has a lot of pain in her ankle and fears it could be fractured. Would like it to be x-rayed. Patient also has a possible bite on her ankle from a snake Reason for Disposition  [1] MODERATE pain (e.g., interferes with normal activities, limping) AND [2] present > 3 days  Answer Assessment - Initial Assessment Questions 1. ONSET: When did the pain start?      2 days ago 2. LOCATION: Where is the pain located?      Left ankle- outer 3. PAIN: How bad is the pain?  (Scale 1-10; or mild, moderate, severe)     Sharp severe 4. WORK OR EXERCISE: Has there been any recent work or exercise that involved this part of the body?      Exercising with dog 5. CAUSE: What do you think is causing the ankle pain?     Not sure- previous injury to that ankle 6. OTHER SYMPTOMS: Do you have any other symptoms? (e.g., calf pain, rash, fever, swelling)     No swelling, bruising, Patient has been raking leaves and has bite mark on top of same foot- redness, irritation present  Protocols used: Ankle Pain-A-AH

## 2024-05-12 NOTE — Progress Notes (Signed)
 Phone [phone] In person visit   Subjective:   Rebekah Campbell is a 58 y.o. year old very pleasant female patient who presents for/with See problem oriented charting Chief Complaint  Patient presents with   Ankle Pain    Ankle pain started two days ago. No injury at that time. Was walking the dog. Wanting an xray. Hx or ankle fractures.    Nasal Congestion   Snoring    Nasal congestion and snorting started about six months. Snoring so much she wakes her self up.     Past Medical History-  Patient Active Problem List   Diagnosis Date Noted   Age-related osteoporosis without current pathological fracture 04/18/2024   Allergic rhinitis 04/18/2024   Allergic contact dermatitis due to plants, except food 03/06/2023   Diarrhea 03/06/2023   Anxiety 08/14/2020   Essential hypertension 11/27/2017   Low vitamin D  level 11/27/2017   Arthritis of carpometacarpal [facility]) joint of left thumb 10/27/2017   TFCC (triangular fibrocartilage complex) injury, left, initial encounter 10/02/2017   Attention deficit hyperactivity disorder (ADHD), predominantly inattentive type 01/03/2016   Recurrent major depressive disorder, in full remission 01/03/2016   Insomnia 09/21/2015    Medications- reviewed and updated Current Outpatient Medications  Medication Sig Dispense Refill   Abaloparatide (TYMLOS) 3120 MCG/1.56ML SOPN Inject 80 mcg into the skin daily at 6 (six) AM. 1.56 mL 12   amphetamine -dextroamphetamine  (ADDERALL) 30 MG tablet Take 1 tablet by mouth 2 (two) times daily. 180 tablet 0   Ginkgo Biloba Extract 60 MG CAPS Take 60 mg by mouth daily.     Glucosamine-Chondroit-Vit C-Mn (GLUCOSAMINE 1500 COMPLEX PO) Take 2 tablets by mouth daily.     hydrochlorothiazide  (HYDRODIURIL ) 25 MG tablet TAKE 1 TABLET BY MOUTH EVERY DAY 90 tablet 1   Milk Thistle 140 MG CAPS Take 140 mg by mouth daily.     Omega-3 Fatty Acids (FISH OIL) 1000 MG CAPS Take 1,000 mg by mouth daily.     traZODone  (DESYREL )  50 MG tablet Take 0.5-1 tablets (25-50 mg total) by mouth at bedtime as needed for sleep. 30 tablet 3   vitamin E 1000 UNIT capsule Take 1,000 Units by mouth daily.     No current facility-administered medications for this visit.     Objective:  BP 110/64   Pulse 96   Temp (!) 97.3 F (36.3 C) (Temporal)   Ht 5' 5 (1.651 m)   Wt 131 lb (59.4 kg)   SpO2 98%   BMI 21.80 kg/m  Gen: NAD, resting comfortably CV: RRR no murmurs rubs or gallops Lungs: CTAB no crackles, wheeze, rhonchi.  High normal heart rate Ext: no edema including around the ankle Skin: warm, dry Ankle: No visible erythema or swelling. Range of motion is full in all directions. Strength is 5/5 in all directions. Stable lateral and medial ligaments Some pain with palpation below lateral malleolus but reports not as significant as what she is normally experiencing With dorsiflexion, plantarflexion, eversion, inversion, unable to produce pain-when she independently dorsiflexes reports pain No tenderness on posterior aspects of lateral and medial malleolus Negative tarsal tunnel tinel's Able to walk 4 steps.     Assessment and Plan     # Left ankle pain S: Pain started 2 days ago.  She denies injury.  Pain did start while out in yard playing with dog but gradually began to ache more and more. It was rather cold. Pain can be very sharp. Did not roll this time. If turns  ankle while sitting can get a small shock/stab like pain in her left lateral ankle posteriorly. Hurts recurrently with walking on it. Pain can be severe. Worse with everting the foot.   She reports a history of ankle fractures in the past with injury with dogs usually involved.  A/P: Patient with left ankle pain for the last 2 days May gradually worsening while walking her dog and is worse with dorsiflexion of the foot but unable to reproduce with palpation.  She has a history of ankle fracture on that side and she was to be proactive with x-rays and  podiatry -possible strain- trial short term meloxicam  for 10 days- warned about ptoential risks -also has ganglion cyst on both foot she wants them to look at  # Snoring/nasal congestion S: Patient reports significant snoring starting about 6 months ago.  She has a lot of nasal congestion and she wonders if it could be related.  She snores heavily it can even wake her up. No foreign body. Has tried breathe right strip. Has tried Neti pots  A/P: Patient with significant nasal congestion noted producing significant snoring starting about 6 months ago.  Nasal turbinates appear normal to me but with prolonged issues decided to refer to ENT.  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  I encouraged her to schedule follow-up with primary care doctor before the end of the year-she reports she herself may be losing insurance  Recommended follow up: Return for as needed for new, worsening, persistent symptoms. Future Appointments  Date Time Provider Department Center  06/08/2024 10:45 AM [facility] None    Lab/Order associations:   ICD-10-CM   1. Acute left ankle pain  M25.572 Ambulatory referral to Podiatry    DG Ankle Complete Left    2. History of ankle fracture  Z87.81 Ambulatory referral to Podiatry    DG Ankle Complete Left    3. Ganglion cyst of foot  M67.479 Ambulatory referral to Podiatry    4. Nasal congestion  R09.81 Ambulatory referral to ENT    5. Snoring  R06.83 Ambulatory referral to ENT      Meds ordered this encounter  Medications   meloxicam  (MOBIC ) 7.5 MG tablet    Sig: Take 1 tablet (7.5 mg total) by mouth daily.    Dispense:  10 tablet    Refill:  0   I personally spent a total of 31 minutes in the care  of the patient today including preparing to see the patient, getting/reviewing separately obtained history, performing a medically appropriate exam/evaluation, counseling and educating including options for workup as well as benefits and potential risks for NSAIDs like meloxicam , referring and communicating with other health care professionals, and documenting clinical information in the EHR.   Return precautions advised.  Garnette Lukes, MD

## 2024-05-13 NOTE — Progress Notes (Signed)
 Left Detailed VM for patient. Has appointment on 12/10.

## 2024-06-07 ENCOUNTER — Ambulatory Visit: Admitting: Family Medicine

## 2024-06-07 ENCOUNTER — Ambulatory Visit

## 2024-06-07 VITALS — BP 128/70 | HR 110 | Temp 97.8°F | Ht 65.0 in | Wt 128.6 lb

## 2024-06-07 DIAGNOSIS — M810 Age-related osteoporosis without current pathological fracture: Secondary | ICD-10-CM

## 2024-06-07 DIAGNOSIS — F900 Attention-deficit hyperactivity disorder, predominantly inattentive type: Secondary | ICD-10-CM

## 2024-06-07 DIAGNOSIS — M25551 Pain in right hip: Secondary | ICD-10-CM

## 2024-06-07 MED ORDER — PREDNISONE 20 MG PO TABS: 20.0000 mg | ORAL_TABLET | Freq: Every day | ORAL | 0 refills | Status: AC

## 2024-06-07 MED ORDER — AMPHETAMINE-DEXTROAMPHETAMINE 30 MG PO TABS: 30.0000 mg | ORAL_TABLET | Freq: Two times a day (BID) | ORAL | 0 refills | Status: AC

## 2024-06-07 NOTE — Patient Instructions (Signed)
 It was very nice to see you today!  VISIT SUMMARY: Today, we addressed your severe right hip pain, which is affecting your ability to walk, and discussed your ongoing management of osteoporosis and ADHD.  YOUR PLAN: RIGHT HIP PAIN WITH BURSITIS AND OSTEOARTHRITIS: You have severe right hip pain that worsens at night and affects your ability to walk. The pain radiates down your leg and may be due to arthritis and bursitis. -We have ordered a right hip x-ray to get a better understanding of the issue. -You have been referred to an orthopedic specialist at [facility] for further evaluation. -Prednisone  has been prescribed to help relieve your symptoms.  AGE-RELATED OSTEOPOROSIS: You have osteoporosis, and previous injections were not covered by your insurance. You are currently managing it with calcium, vitamin D , and weight-bearing exercises. -Continue taking calcium and vitamin D  supplements. -Keep doing weight-bearing exercises. -A bone density scan will be repeated in a couple of years.  ATTENTION DEFICIT HYPERACTIVITY DISORDER, PREDOMINANTLY INATTENTIVE TYPE: Your ADHD is being managed with Adderall, though there are some insurance issues with prescription coverage. -Your Adderall prescription has been refilled.  Return if symptoms worsen or fail to improve.   Take care, Dr Kennyth  PLEASE NOTE:  If you had any lab tests, please let us  know if you have not heard back within a few days. You may see your results on mychart before we have a chance to review them but we will give you a call once they are reviewed by us .   If we ordered any referrals today, please let us  know if you have not heard from their office within the next week.   If you had any urgent prescriptions sent in today, please check with the pharmacy within an hour of our visit to make sure the prescription was transmitted appropriately.   Please try these tips to maintain a healthy lifestyle:  Eat at least 3  REAL meals and 1-2 snacks per day.  Aim for no more than 5 hours between eating.  If you eat breakfast, please do so within one hour of getting up.   Each meal should contain half fruits/vegetables, one quarter protein, and one quarter carbs (no bigger than a computer mouse)  Cut down on sweet beverages. This includes juice, soda, and sweet tea.   Drink at least 1 glass of water with each meal and aim for at least 8 glasses per day  Exercise at least 150 minutes every week.

## 2024-06-07 NOTE — Assessment & Plan Note (Signed)
 Symptoms stable on Adderall 30 mg twice daily.  Medications help with ability stay focused on task.  Will refill today.  Database  reviewed without red flags.  Follow-up in 3 to 6 months.

## 2024-06-07 NOTE — Assessment & Plan Note (Signed)
 Patient recently saw osteoporosis clinic and elected against pharmacologic treatment at this point.  She is working on optimizing calcium and vitamin D  and weightbearing exercises.  Repeat DEXA in 1 to 2 years.

## 2024-06-07 NOTE — Progress Notes (Signed)
   Rebekah Campbell is a 58 y.o. female who presents today for an office visit.  Assessment/Plan:  New/Acute Problems: Right Leg Pain Exam consistent with trochanteric bursitis though may have some component of sciatica as well.  Likely has underlying osteoarthritis as well.  Will check plain film given length of symptoms and refer to orthopedics per patient request.  Will give small prednisone  burst for symptoms though defer further management to orthopedics.  We did just reasons to return to care.  Follow-up as needed.  Chronic Problems Addressed Today: No problem-specific Assessment & Plan notes found for this encounter.     Subjective:  HPI:  See assessment / plan for status of chronic conditions.   Discussed the use of AI scribe software for clinical note transcription with the patient, who gave verbal consent to proceed.  History of Present Illness Rebekah Campbell is a 58 year old female with osteoporosis who presents with hip pain.  She experiences severe hip pain that begins around 10 PM and can also occur in the morning. The pain is so intense that she is unable to walk and must sit down. It radiates down her leg and is similar to the sensation she experienced with a previous hernia. She has a history of a stress fracture in her hip from 25 years ago and bursitis, which she believes might be contributing to her current symptoms. She also recalls a large hematoma in the past that was painful for two months.  She has a history of osteoporosis and is currently managing her condition with lifestyle modifications. She previously consulted with an osteoporosis specialist who discussed treatment options, including shots that were not covered by her insurance.  She is currently taking Adderall 30 mg twice a day for ADHD but has faced issues with insurance coverage for the medication. She is concerned about her insurance coverage changing next year and is trying to address her health  issues before then.  She experiences morning stiffness in her hands, which she associates with arthritis, and notes that her hands are swollen upon waking.         Objective:  Physical Exam: BP 128/70   Pulse (!) 110   Temp 97.8 F (36.6 C) (Temporal)   Ht 5' 5 (1.651 m)   Wt 128 lb 9.6 oz (58.3 kg)   SpO2 96%   BMI 21.40 kg/m   Gen: No acute distress, resting comfortably CV: Regular rate and rhythm with no murmurs appreciated Pulm: Normal work of breathing, clear to auscultation bilaterally with no crackles, wheezes, or rhonchi MUSCULOSKELETAL: - Right Leg: No deformities.  Tender to palpation along posterior aspect of right greater trochanter.  No pain with internal or external rotation.  Neurovascular intact distally. Neuro: Grossly normal, moves all extremities Psych: Normal affect and thought content      Karron Alvizo M. Kennyth, MD 06/07/2024 10:16 AM

## 2024-06-08 ENCOUNTER — Other Ambulatory Visit

## 2024-06-08 ENCOUNTER — Ambulatory Visit (INDEPENDENT_AMBULATORY_CARE_PROVIDER_SITE_OTHER): Admitting: Student

## 2024-06-08 DIAGNOSIS — M25551 Pain in right hip: Secondary | ICD-10-CM

## 2024-06-08 DIAGNOSIS — Z006 Encounter for examination for normal comparison and control in clinical research program: Secondary | ICD-10-CM

## 2024-06-08 DIAGNOSIS — M5431 Sciatica, right side: Secondary | ICD-10-CM | POA: Diagnosis not present

## 2024-06-08 MED ORDER — LIDOCAINE HCL 1 % IJ SOLN
4.0000 mL | INTRAMUSCULAR | Status: AC | PRN
  Administered 2024-06-08: 4 mL

## 2024-06-08 MED ORDER — TRIAMCINOLONE ACETONIDE 40 MG/ML IJ SUSP
2.0000 mL | INTRAMUSCULAR | Status: AC | PRN
  Administered 2024-06-08: 2 mL via INTRA_ARTICULAR

## 2024-06-08 NOTE — Progress Notes (Signed)
 Chief Complaint: Right hip pain    Discussed the use of AI scribe software for clinical note transcription with the patient, who gave verbal consent to proceed.  History of Present Illness Rebekah Campbell is a 58 year old female who presents with a chief complaint of right hip pain.  She describes severe hip pain that feels locked and nerve related, radiating from the hip down the leg to the foot and back up. It can occur during the day but typically starts around 10 PM and lasts through the night, and is sometimes unbearable. Ice, heat, and Advil have not helped. There is no associated numbness or tingling. Prior injections for similar pain were helpful. She recalls being told she is borderline osteoporotic and has had multiple traumatic fractures.  She was seen by her primary care yesterday for x-rays and was prescribed a course of oral prednisone  which she has not began this.   Surgical History:   None  PMH/PSH/Family History/Social History/Meds/Allergies:    Past Medical History:  Diagnosis Date   ADHD (attention deficit hyperactivity disorder)    Allergy 2.2019   Seasonal   Anxiety 09/2018   Nerve study 4 25   Depression    HTN (hypertension)    Inguinal hernia of right side with obstruction and without gangrene    Neuromuscular disorder (HCC) 09/2023   Carpal tunnel left and right hand   Past Surgical History:  Procedure Laterality Date    IRRIGATION AND DEBRIDEMENT EXTR IRRIGATION AND DEBRIDEMENT EXTREMITY (Left )EMITY (Left )  10/01/2020   FRACTURE SURGERY  4. 2020   2 arms compound fracture 2 toes broken collar bone fracture   HERNIA REPAIR     I & D EXTREMITY Left 10/01/2020   Procedure: IRRIGATION AND DEBRIDEMENT EXTREMITY;  Surgeon: Kendal Franky SHAUNNA, MD;  Location: [facility];  Service: Orthopedics;  Laterality: Left;   OPEN REDUCTION INTERNAL FIXATION (ORIF) DISTAL HUMERUS FRACTURE (Left )  10/01/2020   ORIF HUMERUS FRACTURE Left  10/01/2020   Procedure: OPEN REDUCTION INTERNAL FIXATION (ORIF) DISTAL HUMERUS FRACTURE;  Surgeon: Kendal Franky SHAUNNA, MD;  Location: [facility] OR;  Service: Orthopedics;  Laterality: Left;   ORIF HUMERUS FRACTURE Left 12/12/2020   Procedure: OPEN REDUCTION INTERNAL FIXATION (ORIF) HUMERAL SHAFT FRACTURE;  Surgeon: Kendal Franky SHAUNNA, MD;  Location: [facility] OR;  Service: Orthopedics;  Laterality: Left;   TONSILLECTOMY     Social History   Socioeconomic History   Marital status: Single    Spouse name: Not on file   Number of children: Not on file   Years of education: Not on file   Highest education level: Bachelor's degree (e.g., BA, AB, BS)  Occupational History   Not on file  Tobacco Use   Smoking status: Never   Smokeless tobacco: Never  Substance and Sexual Activity   Alcohol use: Yes    Alcohol/week: 3.0 - 5.0 standard drinks of alcohol    Types: 3 - 5 Glasses of wine per week   Drug use: No   Sexual activity: Yes  Other Topics Concern   Not on file  Social History Narrative   Not on file   Social Drivers of Health   Financial Resource Strain: Medium Risk (06/07/2024)   Overall Financial Resource Strain (CARDIA)    Difficulty of Paying Living Expenses: Somewhat  hard  Food Insecurity: No Food Insecurity (06/07/2024)   Hunger Vital Sign    Worried About Running Out of Food in the Last Year: Never true    Ran Out of Food in the Last Year: Never true  Transportation Needs: No Transportation Needs (06/07/2024)   PRAPARE - Administrator, Civil Service (Medical): No    Lack of Transportation (Non-Medical): No  Physical Activity: Sufficiently Active (06/07/2024)   Exercise Vital Sign    Days of Exercise per Week: 5 days    Minutes of Exercise per Session: 30 min  Stress: Stress Concern Present (06/07/2024)   [employer] of Occupational Health - Occupational Stress Questionnaire    Feeling of Stress: To some extent  Social Connections: Moderately Integrated (06/07/2024)    Social Connection and Isolation Panel    Frequency of Communication with Friends and Family: More than three times a week    Frequency of Social Gatherings with Friends and Family: Never    Attends Religious Services: 1 to 4 times per year    Active Member of [employer] or Organizations: No    Attends Engineer, Structural: Not on file    Marital Status: Living with partner   Family History  Problem Relation Age of Onset   Anesthesia problems Neg Hx    Allergies  Allergen Reactions   Codeine Nausea And Vomiting, Nausea Only and Other (See Comments)   Current Outpatient Medications  Medication Sig Dispense Refill   Abaloparatide  (TYMLOS ) 3120 MCG/1.56ML SOPN Inject 80 mcg into the skin daily at 6 (six) AM. 1.56 mL 12   amphetamine -dextroamphetamine  (ADDERALL) 30 MG tablet Take 1 tablet by mouth 2 (two) times daily. 180 tablet 0   Ginkgo Biloba Extract 60 MG CAPS Take 60 mg by mouth daily.     Glucosamine-Chondroit-Vit C-Mn (GLUCOSAMINE 1500 COMPLEX PO) Take 2 tablets by mouth daily.     hydrochlorothiazide  (HYDRODIURIL ) 25 MG tablet TAKE 1 TABLET BY MOUTH EVERY DAY 90 tablet 1   meloxicam  (MOBIC ) 7.5 MG tablet Take 1 tablet (7.5 mg total) by mouth daily. 10 tablet 0   Milk Thistle 140 MG CAPS Take 140 mg by mouth daily.     Omega-3 Fatty Acids (FISH OIL) 1000 MG CAPS Take 1,000 mg by mouth daily.     omeprazole  (PRILOSEC) 40 MG capsule 1 cap(s) orally once a day     predniSONE  (DELTASONE ) 20 MG tablet Take 1 tablet (20 mg total) by mouth daily with breakfast. 7 tablet 0   traZODone  (DESYREL ) 50 MG tablet Take 0.5-1 tablets (25-50 mg total) by mouth at bedtime as needed for sleep. 30 tablet 3   vitamin E 1000 UNIT capsule Take 1,000 Units by mouth daily.     No current facility-administered medications for this visit.   No results found.  Review of Systems:   A ROS was performed including pertinent positives and negatives as documented in the HPI.  Physical Exam :    Constitutional: NAD and appears stated age Neurological: Alert and oriented Psych: Appropriate affect and cooperative There were no vitals taken for this visit.   Comprehensive Musculoskeletal Exam:    Exam of the right hip demonstrates tenderness with palpation over the right SI joint and posterior greater trochanter.  Full fluid passive right hip range of motion to 140 degrees flexion, 40 degrees external rotation, and 30 degrees internal rotation.  No tenderness over the lumbar midline.  Negative right straight leg raise and Deri.  Patellar DTRs  2+ and equal.  No lower extremity weakness present.  Imaging:   Xray review from 06/07/2024 (AP pelvis, right hip 2 views): No evidence of acute bony abnormalities.  Bilateral hip joint spaces are well-maintained.   I personally reviewed and interpreted the radiographs.      Assessment & Plan Right hip pain Patient presents with likely multifactorial right hip pain.  She is experiencing radiating pain down the entire right lower extremity that appears consistent with sciatica.  She does also demonstrate tenderness over the greater trochanter and reports that she has had not resolution and similar symptoms previously with an injection although not able to find prior documentation of this.  Discussed available treatment options including physical therapy, oral NSAIDs or steroids, and trial of a cortisone injection.  Patient would like to proceed with a cortisone injection of the greater trochanter, which was performed under ultrasound guidance and she tolerated this well.  She does have follow-up already scheduled in 1 week with another orthopedic provider regarding her hip so she can plan to follow-up in our clinic as needed.     Procedure Note  Patient: Rebekah Campbell             Date of Birth: Apr 16, 1966           MRN: [id number]             Visit Date: 06/08/2024  Procedures: Visit Diagnoses:  1. Greater trochanteric pain syndrome of  right lower extremity   2. Sciatica, right side     Large Joint Inj: R greater trochanter on 06/08/2024 2:15 PM Indications: pain Details: 22 G 3.5 in needle, lateral approach Medications: 4 mL lidocaine  1 %; 2 mL triamcinolone  acetonide 40 MG/ML Outcome: tolerated well, no immediate complications Procedure, treatment alternatives, risks and benefits explained, specific risks discussed. Consent was given by the patient. Immediately prior to procedure a time out was called to verify the correct patient, procedure, equipment, support staff and site/side marked as required. Patient was prepped and draped in the usual sterile fashion.        I personally saw and evaluated the patient, and participated in the management and treatment plan.  Leonce Reveal, PA-C Orthopedics

## 2024-06-09 ENCOUNTER — Ambulatory Visit (INDEPENDENT_AMBULATORY_CARE_PROVIDER_SITE_OTHER): Admitting: Otolaryngology

## 2024-06-09 ENCOUNTER — Encounter (INDEPENDENT_AMBULATORY_CARE_PROVIDER_SITE_OTHER): Payer: Self-pay | Admitting: Otolaryngology

## 2024-06-09 VITALS — BP 174/105 | HR 120 | Ht 65.0 in | Wt 124.0 lb

## 2024-06-09 DIAGNOSIS — R0683 Snoring: Secondary | ICD-10-CM

## 2024-06-09 DIAGNOSIS — R448 Other symptoms and signs involving general sensations and perceptions: Secondary | ICD-10-CM

## 2024-06-09 DIAGNOSIS — J328 Other chronic sinusitis: Secondary | ICD-10-CM

## 2024-06-09 DIAGNOSIS — J343 Hypertrophy of nasal turbinates: Secondary | ICD-10-CM

## 2024-06-09 DIAGNOSIS — R0981 Nasal congestion: Secondary | ICD-10-CM

## 2024-06-09 MED ORDER — FLONASE SENSIMIST 27.5 MCG/SPRAY NA SUSP: 2.0000 | Freq: Every day | NASAL | 12 refills | Status: AC

## 2024-06-09 NOTE — Progress Notes (Unsigned)
 Dear Dr. Katrinka, Here is my assessment for our mutual patient, Rebekah Campbell. Thank you for allowing me the opportunity to care for your patient. Please do not hesitate to contact me should you have any other questions. Sincerely, Dr. Eldora Blanch  Otolaryngology Clinic Note  HISTORY: Rebekah Campbell is a 58 y.o. female kindly referred by Dr. Katrinka for evaluation of nasal congestion and snoring.   Initial visit (05/2024): Discussed the use of AI scribe software for clinical note transcription with the patient, who gave verbal consent to proceed.  History of Present Illness Rebekah Campbell is a 58 year old female with chronic sinus symptoms who presents with one year of persistent sinus pressure, headaches, and new-onset snoring.  For the past year, she has had persistent sinus pressure over the forehead and cheeks with daily morning-predominant headaches, frequent postnasal drainage, intermittent nasal congestion, occasional yellow nasal discharge, most recently within the past month. Symptoms worsen with indoor heating in winter. Extensive home cleaning and mold testing were unrevealing. She spends significant time gardening and suspects pollen as a trigger. She has not had allergy testing. She denies fever, temperature or pressure sensitivity, nasal or ocular pruritus.  She takes daily single-ingredient over-the-counter antihistamines with partial relief of congestion and avoids multi-ingredient products because they cause headache and dizziness. She uses saline nasal rinses about once weekly. She has not used intranasal steroid sprays or antibiotics for these symptoms. Presumed sinus infections resolve without treatment with abx/steroids.  Over the past year she has developed nightly snoring that is new, persistent, and severe enough to wake her from sleep. She links this to morning headaches and poor sleep quality and is concerned about the cause. She has not had a sleep study. She received  paperwork for a home sleep test but has not completed it and prefers an in-lab study. She has not recently taken oral prednisone , though she has an unstarted prescription, and she received a hip steroid injection the day before this visit.  She had a tonsillectomy in childhood and no other ENT surgery.   Allergy testing has not been done - does not have frequent typical AR symptoms.  No previous sinonasal surgery.  GLP-1: no AP/AC: no  Tobacco: no  PMHx: HTN, GAD, Insomnia, ADHD, GERD  RADIOGRAPHIC EVALUATION AND INDEPENDENT REVIEW OF OTHER RECORDS:: Dr. Katrinka (05/12/2024): snoring 6 months ago, with nasal congestion. Tried breathe strips and neti pot without significant sinus infection sx; Dx: Snoring/nasal congestion; Rx: ref to ENT Dr. Kennyth (11/26/2022): chronic sinus infection sx, start doxy; OTC antiistamine; ref to ENT  [facility] 11/29/2009 independently interpreted with respect to sinuses: cuts thick so suboptimal but paranasal sinuses well aerated; query right septal deviation  CBC and CMP 10/05/2023: WBC 5.5, BUN/Cr 13/0.61; Eos 2022: 0 Past Medical History:  Diagnosis Date   ADHD (attention deficit hyperactivity disorder)    Allergy 2.2019   Seasonal   Anxiety 09/2018   Nerve study 4 25   Depression    HTN (hypertension)    Inguinal hernia of right side with obstruction and without gangrene    Neuromuscular disorder (HCC) 09/2023   Carpal tunnel left and right hand   Past Surgical History:  Procedure Laterality Date    IRRIGATION AND DEBRIDEMENT EXTR IRRIGATION AND DEBRIDEMENT EXTREMITY (Left )EMITY (Left )  10/01/2020   FRACTURE SURGERY  4. 2020   2 arms compound fracture 2 toes broken collar bone fracture   HERNIA REPAIR     I & D EXTREMITY Left 10/01/2020  Procedure: IRRIGATION AND DEBRIDEMENT EXTREMITY;  Surgeon: Kendal Franky SQUIBB, MD;  Location: [facility] OR;  Service: Orthopedics;  Laterality: Left;   OPEN REDUCTION INTERNAL FIXATION (ORIF) DISTAL HUMERUS FRACTURE (Left )   10/01/2020   ORIF HUMERUS FRACTURE Left 10/01/2020   Procedure: OPEN REDUCTION INTERNAL FIXATION (ORIF) DISTAL HUMERUS FRACTURE;  Surgeon: Kendal Franky SQUIBB, MD;  Location: [facility] OR;  Service: Orthopedics;  Laterality: Left;   ORIF HUMERUS FRACTURE Left 12/12/2020   Procedure: OPEN REDUCTION INTERNAL FIXATION (ORIF) HUMERAL SHAFT FRACTURE;  Surgeon: Kendal Franky SQUIBB, MD;  Location: [facility];  Service: Orthopedics;  Laterality: Left;   TONSILLECTOMY     Family History  Problem Relation Age of Onset   Anesthesia problems Neg Hx    Social History   Tobacco Use   Smoking status: Never   Smokeless tobacco: Never  Substance Use Topics   Alcohol use: Yes    Alcohol/week: 3.0 - 5.0 standard drinks of alcohol    Types: 3 - 5 Glasses of wine per week   Allergies[1] Current Outpatient Medications  Medication Sig Dispense Refill   Abaloparatide  (TYMLOS ) 3120 MCG/1.56ML SOPN Inject 80 mcg into the skin daily at 6 (six) AM. 1.56 mL 12   amphetamine -dextroamphetamine  (ADDERALL) 30 MG tablet Take 1 tablet by mouth 2 (two) times daily. 180 tablet 0   fluticasone (FLONASE  SENSIMIST) 27.5 MCG/SPRAY nasal spray Place 2 sprays into the nose daily. 10 g 12   Ginkgo Biloba Extract 60 MG CAPS Take 60 mg by mouth daily.     Glucosamine-Chondroit-Vit C-Mn (GLUCOSAMINE 1500 COMPLEX PO) Take 2 tablets by mouth daily.     hydrochlorothiazide  (HYDRODIURIL ) 25 MG tablet TAKE 1 TABLET BY MOUTH EVERY DAY 90 tablet 1   meloxicam  (MOBIC ) 7.5 MG tablet Take 1 tablet (7.5 mg total) by mouth daily. 10 tablet 0   Milk Thistle 140 MG CAPS Take 140 mg by mouth daily.     Omega-3 Fatty Acids (FISH OIL) 1000 MG CAPS Take 1,000 mg by mouth daily.     omeprazole  (PRILOSEC) 40 MG capsule 1 cap(s) orally once a day     predniSONE  (DELTASONE ) 20 MG tablet Take 1 tablet (20 mg total) by mouth daily with breakfast. 7 tablet 0   traZODone  (DESYREL ) 50 MG tablet Take 0.5-1 tablets (25-50 mg total) by mouth at bedtime as needed for sleep. 30  tablet 3   vitamin E 1000 UNIT capsule Take 1,000 Units by mouth daily.     No current facility-administered medications for this visit.   BP (!) 174/105 (BP Location: Left Arm, Patient Position: Sitting, Cuff Size: Normal)   Pulse (!) 120   Ht 5' 5 (1.651 m)   Wt 124 lb (56.2 kg)   SpO2 98%   BMI 20.63 kg/m   PHYSICAL EXAM:  BP (!) 174/105 (BP Location: Left Arm, Patient Position: Sitting, Cuff Size: Normal)   Pulse (!) 120   Ht 5' 5 (1.651 m)   Wt 124 lb (56.2 kg)   SpO2 98%   BMI 20.63 kg/m    Salient findings:  CN II-XII intact Bilateral EAC clear and TM intact with well pneumatized middle ear spaces Nose: Anterior rhinoscopy reveals septum relatively midline, mild inferior turbinate hypertrophy.  Nasal endoscopy was indicated to better evaluate the nose and paranasal sinuses, given the patient's history and exam findings, and is detailed below. No lesions of oral cavity/oropharynx; tonsils absent; Tongue Friedman 2 No obviously palpable neck masses/lymphadenopathy/thyromegaly No respiratory distress or stridor; thin neck  PROCEDURE:  Prior to initiating any procedures, risks/benefits/alternatives were explained to the patient and verbal consent obtained. Diagnostic Nasal Endoscopy Pre-procedure diagnosis: Concern for facial pressure and sinusitis, nasal congestion Post-procedure diagnosis: same Indication: See pre-procedure diagnosis and physical exam above Complications: None apparent EBL: 0 mL Anesthesia: Lidocaine  4% and topical decongestant was topically sprayed in each nasal cavity  Description of Procedure:  Patient was identified. A rigid 30 degree endoscope was utilized to evaluate the sinonasal cavities, mucosa, sinus ostia and turbinates and septum.  Overall, signs of mucosal inflammation are not noted.  No mucopurulence, polyps, or masses noted.   Right Middle meatus: clear Right SE Recess: clear Left MM: clear Left SE Recess: clear Photodocumentation  was obtained.  CPT CODE -- 31231 - Mod 25   ASSESSMENT:  58 y.o. with:  1. Snoring   2. Facial pressure   3. Other chronic sinusitis   4. Hypertrophy of both inferior nasal turbinates   5. Nasal congestion    Chronic sinusitis/facial pressure/congestion Chronic sinonasal symptoms but sound more like atypical facial pain rather than true sinusitis. Nasal endoscopy showed no acute infection, polyps, or purulence. As such, we discussed options and we will treat medically and obtain post-treatment CT - Prescribed fluticasone nasal spray, two sprays in each nostril daily. - Advised continuation of saline nasal rinses, discontinue three days prior to CT scan. - Ordered non-contrast CT scan of the sinuses. - Scheduled follow-up phone visit in 6-8 weeks to review CT scan results.  Suspected obstructive sleep apnea - New-onset snoring and morning headaches suggest obstructive sleep apnea, likely due to pharyngeal airway collapse. Sleep apnea suspected as cause of headaches and poor sleep quality. - Ordered in-lab overnight sleep study for definitive diagnosis. - Advised her to contact insurance for coverage and prior authorization for the sleep study. - Scheduled follow-up phone visit in 6-8 weeks to review sleep study results if available.  See below regarding exact medications prescribed this encounter including dosages and route: Meds ordered this encounter  Medications   fluticasone (FLONASE  SENSIMIST) 27.5 MCG/SPRAY nasal spray    Sig: Place 2 sprays into the nose daily.    Dispense:  10 g    Refill:  12     Thank you for allowing me the opportunity to care for your patient. Please do not hesitate to contact me should you have any other questions.  Sincerely, Eldora Blanch, MD Otolaryngologist (ENT), [facility] Phone: [phone] Fax: [phone]  MDM:  Level 4: [phone] Complexity/Problems addressed: mod - multiple chronic problems Data complexity: high -  independent interpretation of CT; review of note, labs, ordering test - Morbidity: mod  - Drug prescribed or managed: y  06/12/2024, 9:57 AM      [1]  Allergies Allergen Reactions   Codeine Nausea And Vomiting, Nausea Only and Other (See Comments)

## 2024-06-09 NOTE — Patient Instructions (Addendum)
 Do not use the flush/rinse or sprays 3 days before the CT scan -- risk of false positive Use flonase sensimist two sprays each nostril daily I have ordered an CT scan study for you to complete prior to your next visit. Please call [facility] Scheduling at [phone] to schedule your imaging if you have not received a call within 24 hours. If you are unable to complete your imaging study prior to your next scheduled visit please call our office to let us  know.   We will get a sleep study --- call us  if you do not hear back in about a week

## 2024-06-10 ENCOUNTER — Ambulatory Visit: Admitting: Podiatry

## 2024-06-10 DIAGNOSIS — M25372 Other instability, left ankle: Secondary | ICD-10-CM | POA: Diagnosis not present

## 2024-06-10 DIAGNOSIS — M899 Disorder of bone, unspecified: Secondary | ICD-10-CM | POA: Diagnosis not present

## 2024-06-10 DIAGNOSIS — M21961 Unspecified acquired deformity of right lower leg: Secondary | ICD-10-CM | POA: Diagnosis not present

## 2024-06-10 DIAGNOSIS — M949 Disorder of cartilage, unspecified: Secondary | ICD-10-CM | POA: Diagnosis not present

## 2024-06-10 DIAGNOSIS — M21962 Unspecified acquired deformity of left lower leg: Secondary | ICD-10-CM | POA: Diagnosis not present

## 2024-06-10 NOTE — Progress Notes (Signed)
 Subjective:  Patient ID: Rebekah Campbell, female    DOB: 06-21-1966,  MRN: [id number]  Chief Complaint  Patient presents with   Foot Pain    Has recent xrays on 05/12/24     58 y.o. female presents with the above complaint.  Patient presents with complaint of left lateral ankle pain/ankle sprain.  She states that is doing okay today.  She had recent x-rays done.  Denies any other acute complaints wanted get it evaluated.  She is also here to go over the x-ray results.  She would like to discuss treatment options for this.  Her pain is manageable at 1 out of 10.   Review of Systems: Negative except as noted in the HPI. Denies N/V/F/Ch.  Past Medical History:  Diagnosis Date   ADHD (attention deficit hyperactivity disorder)    Allergy 2.2019   Seasonal   Anxiety 09/2018   Nerve study 4 25   Depression    HTN (hypertension)    Inguinal hernia of right side with obstruction and without gangrene    Neuromuscular disorder (HCC) 09/2023   Carpal tunnel left and right hand   Current Medications[1]  Tobacco Use History[2]  Allergies[3] Objective:  There were no vitals filed for this visit. There is no height or weight on file to calculate BMI. Constitutional Well developed. Well nourished.  Vascular Dorsalis pedis pulses palpable bilaterally. Posterior tibial pulses palpable bilaterally. Capillary refill normal to all digits.  No cyanosis or clubbing noted. Pedal hair growth normal.  Neurologic Normal speech. Oriented to person, place, and time. Epicritic sensation to light touch grossly present bilaterally.  Dermatologic Nails well groomed and normal in appearance. No open wounds. No skin lesions.  Orthopedic: Pain on palpation to the left ATFL ligament no pain at the posterior tibial peroneal tendon Achilles tendon.  Deep intra-articular ankle pain slightly noted.  Pain with plantarflexion inversion of the foot no pain with dorsiflexion eversion of the foot.  Pes cavus foot  structure noted   Radiographs: IMPRESSION: 1. Subtle lucency along the medial talar dome, worrisome for an osteochondral defect..  No fractures noted Assessment:   1. Ankle instability, left   2. Osteochondral lesion of talar dome   3. Foot deformity, bilateral    Plan:  Patient was evaluated and treated and all questions answered.  Left chronic ankle instability - All questions and concerns were discussed with the patient in extensive detail.  I discussed the importance of shoe gear modification and bracing.  Also discussed orthotics as well.  She states understanding will work on that immediately.  Left ankle osteochondral lesion - Clinically does not cause her any discomfort.  Will reevaluate in the future if it she experiences any deep intra-articular ankle pain.  She states understanding  Pes cavus/foot deformity -I explained to patient the etiology of pes cavus and relationship with heel pain/arch pain and various treatment options were discussed.  Given patient foot structure in the setting of heel pain/arch pain I believe patient will benefit from custom-made orthotics to help control the hindfoot motion support the arch of the foot and take the stress away from arches.  Patient agrees with the plan like to proceed with orthotics -Patient was casted for orthotics       [1]  Current Outpatient Medications:    Abaloparatide  (TYMLOS ) 3120 MCG/1.56ML SOPN, Inject 80 mcg into the skin daily at 6 (six) AM., Disp: 1.56 mL, Rfl: 12   amphetamine -dextroamphetamine  (ADDERALL) 30 MG tablet, Take 1 tablet by  mouth 2 (two) times daily., Disp: 180 tablet, Rfl: 0   fluticasone (FLONASE  SENSIMIST) 27.5 MCG/SPRAY nasal spray, Place 2 sprays into the nose daily., Disp: 10 g, Rfl: 12   Ginkgo Biloba Extract 60 MG CAPS, Take 60 mg by mouth daily., Disp: , Rfl:    Glucosamine-Chondroit-Vit C-Mn (GLUCOSAMINE 1500 COMPLEX PO), Take 2 tablets by mouth daily., Disp: , Rfl:    hydrochlorothiazide   (HYDRODIURIL ) 25 MG tablet, TAKE 1 TABLET BY MOUTH EVERY DAY, Disp: 90 tablet, Rfl: 1   meloxicam  (MOBIC ) 7.5 MG tablet, Take 1 tablet (7.5 mg total) by mouth daily., Disp: 10 tablet, Rfl: 0   Milk Thistle 140 MG CAPS, Take 140 mg by mouth daily., Disp: , Rfl:    Omega-3 Fatty Acids (FISH OIL) 1000 MG CAPS, Take 1,000 mg by mouth daily., Disp: , Rfl:    omeprazole  (PRILOSEC) 40 MG capsule, 1 cap(s) orally once a day, Disp: , Rfl:    predniSONE  (DELTASONE ) 20 MG tablet, Take 1 tablet (20 mg total) by mouth daily with breakfast., Disp: 7 tablet, Rfl: 0   traZODone  (DESYREL ) 50 MG tablet, Take 0.5-1 tablets (25-50 mg total) by mouth at bedtime as needed for sleep., Disp: 30 tablet, Rfl: 3   vitamin E 1000 UNIT capsule, Take 1,000 Units by mouth daily., Disp: , Rfl:  [2]  Social History Tobacco Use  Smoking Status Never  Smokeless Tobacco Never  [3]  Allergies Allergen Reactions   Codeine Nausea And Vomiting, Nausea Only and Other (See Comments)

## 2024-06-13 ENCOUNTER — Ambulatory Visit: Payer: Self-pay | Admitting: Family Medicine

## 2024-06-13 NOTE — Progress Notes (Signed)
 Her x-ray shows degenerative changes in her lower back but no issues in her hip.  Recommend she follow-up with orthopedics if not improving though this should improve with the prednisone  burst and steroid injection that she received last week.

## 2024-06-16 ENCOUNTER — Telehealth (INDEPENDENT_AMBULATORY_CARE_PROVIDER_SITE_OTHER): Payer: Self-pay

## 2024-06-16 MED ORDER — OMEPRAZOLE 40 MG PO CPDR: 40.0000 mg | DELAYED_RELEASE_CAPSULE | Freq: Every day | ORAL | 0 refills | Status: AC

## 2024-06-16 NOTE — Telephone Encounter (Signed)
 Copied from CRM #[id number]. Topic: Clinical - Medication Refill >> Jun 16, 2024  3:04 PM Lauren C wrote: Medication: omeprazole  (PRILOSEC) 40 MG capsule STATES IT IS URGENT BECAUSE SHE IS OUT AND UNABLE TO SWALLOW, DECLINED NT. States historical provider for 3 years ago, pt states she has gotten it since then from Dr. Kennyth.   Has the patient contacted their pharmacy? Yes Not on med list with them  This is the patient's preferred pharmacy:  [id number] - [place],  - [address] [place] [address] Phone: [phone] Fax: [phone]  Is this the correct pharmacy for this prescription? Yes If no, delete pharmacy and type the correct one.   Has the prescription been filled recently? No  Is the patient out of the medication? Yes  Has the patient been seen for an appointment in the last year OR does the patient have an upcoming appointment? Yes  Can we respond through MyChart? Yes  Agent: Please be advised that Rx refills may take up to 3 business days. We ask that you follow-up with your pharmacy.

## 2024-06-16 NOTE — Telephone Encounter (Signed)
 Patient called I answered the phone. Patient stated that the pharmacy needed a pre-authorization for the Flonase  prescription Dr. Tobie sent over for her. I confirmed the pharmacy with the patient. Patient also wanted to know where she is suppose to get her sleep study done at I gave her the name and phone number of the office so she can schedule. Patient understood.   I called the pharmacy to see what they needed from us  and why a pre-authorization was needed. Pharmacist stated they do not need a pre-authorization and that they have already explained to the patient her insurance will not cover the medication because it is over the counter. Pharmacist stated they would call the patient and explain it to her again.

## 2024-06-17 LAB — GENECONNECT MOLECULAR SCREEN: Genetic Analysis Overall Interpretation: NEGATIVE

## 2024-07-15 ENCOUNTER — Telehealth: Payer: Self-pay | Admitting: Podiatry

## 2024-07-15 NOTE — Telephone Encounter (Signed)
 Ortho in [place], lvm to call and schedule an appointment for [facility] [place]

## 2024-07-18 NOTE — Telephone Encounter (Signed)
 Rebekah Campbell left msg on general vm to scheduled appt to [facility]. I called back on 07/18/2024 and left her a vm to call back and schedule appt.

## 2024-07-20 ENCOUNTER — Telehealth (INDEPENDENT_AMBULATORY_CARE_PROVIDER_SITE_OTHER): Payer: Self-pay | Admitting: Otolaryngology

## 2024-07-20 NOTE — Telephone Encounter (Signed)
 The patient called in reporting that she is now experiencing bleeding from her nose and ears, she also said that she has not gotten a call to schedule her imaging yet. I returned her call with the phone number for her to call to get her imaging scheduled. I also asked her to call us  back so we can give her more information about the sleep study referral you sent for her. I wanted to check in with Dr Tobie, as she has listed bleeding from nose and ears to see if he may want a follow up for that prior to her getting imaging and testing done.  Thank you.

## 2024-07-21 ENCOUNTER — Ambulatory Visit (INDEPENDENT_AMBULATORY_CARE_PROVIDER_SITE_OTHER): Admitting: Otolaryngology

## 2024-07-22 NOTE — Telephone Encounter (Signed)
 Left a voicemail informing patient to contact office regarding provider's recommendations.

## 2024-07-26 NOTE — Telephone Encounter (Signed)
 Left a voicemail to see if patient received prior voicemail.

## 2024-07-28 ENCOUNTER — Encounter

## 2024-07-29 ENCOUNTER — Ambulatory Visit: Payer: Self-pay | Admitting: Podiatrist

## 2024-07-29 DIAGNOSIS — M21961 Unspecified acquired deformity of right lower leg: Secondary | ICD-10-CM

## 2024-07-29 DIAGNOSIS — M21962 Unspecified acquired deformity of left lower leg: Secondary | ICD-10-CM

## 2024-07-29 DIAGNOSIS — M25372 Other instability, left ankle: Secondary | ICD-10-CM

## 2024-07-29 NOTE — Progress Notes (Signed)
 ORTHOTIC DISPENSING:   Reason for Visit:         Fitting and Delivery of Custom Fabricated Foot Orthoses Patient Report:            Patient reports comfort and is satisfied with device.   OBJECTIVE DATA: Patient History / Diagnosis:    No change in pathology Provided Device:                     Functional foot orthoses   GOAL OF ORTHOSIS - Improve gait - Decrease energy expenditure - Improve Balance - Provide Triplanar stability of foot complex - Facilitate motion   ACTIONS PERFORMED Patient was fit with custom foot orthoses   Patient was provided with verbal and written instruction and demonstration regarding wear, care, proper fit, function, and use of the orthosis.    Patient was also provided with verbal instruction regarding how to report any failures or malfunctions of the orthosis and necessary follow up care. Patient was also instructed to contact our office regarding any change in status that may affect the function of the orthosis.   Patient demonstrated understanding of all instructions.  Rebekah Campbell, DPM

## 2024-08-02 ENCOUNTER — Ambulatory Visit (HOSPITAL_BASED_OUTPATIENT_CLINIC_OR_DEPARTMENT_OTHER): Payer: Self-pay

## 2024-08-04 ENCOUNTER — Telehealth (INDEPENDENT_AMBULATORY_CARE_PROVIDER_SITE_OTHER): Payer: Self-pay | Admitting: Otolaryngology

## 2024-08-04 NOTE — Telephone Encounter (Signed)
 Left a voicemail informing patient that she should be able to go wherever she would like to have imaging and sleep study done without a new order. If she would need a copy of the orders already in the system she could come and pick it up or get a copy off of My Chart. Also informed the patient that she would need to call her insurance company to find out where to go to have testing done.

## 2024-08-04 NOTE — Telephone Encounter (Signed)
 The patient called back in with the same requests as this morning. I read her the notes about getting her orders and being able to get the requested testing done at a location that she is in network with now.  She was very upset and was ranting about how inconvenient all of that is and that she had no interest in driving to our office or getting her orders off her mychart.  She is of the opinion that it is our responsibility to be able to change her orders and find a location that will work for her new insurance.  I explained that we would need a new insurance card to scan or a picture of the front and back in order to add her insurance.  She responded that would be just pointless and a wasted trip as we are not in network with her insurance now.  I explained she would get a self pay discount on any future appointments in our office.  She ranted again on how getting anything printed off her mychart would make her have to go somewhere with a printer because no one has those in their homes anymore.  I said I was sorry that this was upsetting her and she said that no she was not upset but that I was just stupid and this whole process is stupid.  I had to hang up on her at that point, as she just kept ranting about the situation and was very disrespectful.

## 2024-08-04 NOTE — Telephone Encounter (Signed)
 The patient called in and left a voicemail late in the day yesterday.  She reports that she has changed her insurance as of Jun 30, 2024.  She is now with [employer] (member number [id number]) and she is requesting that we send the imaging and sleep study orders to a place that is covered under her new insurance.  I called the patient back and left a message letting her know that we are not in network with her new insurance, but she would get a discount for being self pay for future visits and that I have sent this message.
